# Patient Record
Sex: Female | Born: 1950
Health system: Southern US, Community
[De-identification: ages and names within clinical notes are randomized; demographics above are authoritative.]

## PROBLEM LIST (undated history)

## (undated) DIAGNOSIS — IMO0002 Reserved for concepts with insufficient information to code with codable children: Secondary | ICD-10-CM

## (undated) DIAGNOSIS — E559 Vitamin D deficiency, unspecified: Secondary | ICD-10-CM

## (undated) DIAGNOSIS — T148XXA Other injury of unspecified body region, initial encounter: Secondary | ICD-10-CM

## (undated) DIAGNOSIS — B019 Varicella without complication: Secondary | ICD-10-CM

## (undated) DIAGNOSIS — Z789 Other specified health status: Secondary | ICD-10-CM

## (undated) DIAGNOSIS — Z87440 Personal history of urinary (tract) infections: Secondary | ICD-10-CM

## (undated) DIAGNOSIS — M1990 Unspecified osteoarthritis, unspecified site: Secondary | ICD-10-CM

## (undated) DIAGNOSIS — U071 COVID-19: Secondary | ICD-10-CM

## (undated) DIAGNOSIS — H269 Unspecified cataract: Secondary | ICD-10-CM

## (undated) DIAGNOSIS — E785 Hyperlipidemia, unspecified: Secondary | ICD-10-CM

## (undated) DIAGNOSIS — K219 Gastro-esophageal reflux disease without esophagitis: Secondary | ICD-10-CM

## (undated) DIAGNOSIS — I82409 Acute embolism and thrombosis of unspecified deep veins of unspecified lower extremity: Secondary | ICD-10-CM

## (undated) HISTORY — PX: WISDOM TOOTH EXTRACTION: SHX21

## (undated) HISTORY — DX: COVID-19: U07.1

## (undated) HISTORY — DX: Gastro-esophageal reflux disease without esophagitis: K21.9

## (undated) HISTORY — PX: TUBAL LIGATION: SHX77

## (undated) HISTORY — DX: Personal history of urinary (tract) infections: Z87.440

## (undated) HISTORY — DX: Acute embolism and thrombosis of unspecified deep veins of unspecified lower extremity: I82.409

## (undated) HISTORY — DX: Other injury of unspecified body region, initial encounter: T14.8XXA

## (undated) HISTORY — DX: Varicella without complication: B01.9

## (undated) HISTORY — DX: Hyperlipidemia, unspecified: E78.5

## (undated) HISTORY — DX: Reserved for concepts with insufficient information to code with codable children: IMO0002

## (undated) HISTORY — DX: Unspecified osteoarthritis, unspecified site: M19.90

## (undated) HISTORY — PX: EYE SURGERY: SHX253

## (undated) HISTORY — PX: JOINT REPLACEMENT: SHX530

## (undated) HISTORY — DX: Unspecified cataract: H26.9

## (undated) HISTORY — DX: Vitamin D deficiency, unspecified: E55.9

---

## 1988-04-05 HISTORY — PX: DILATION AND CURETTAGE OF UTERUS: SHX78

## 1998-12-12 ENCOUNTER — Other Ambulatory Visit: Admission: RE | Admit: 1998-12-12 | Discharge: 1998-12-12 | Payer: Self-pay | Admitting: Obstetrics & Gynecology

## 2000-11-11 ENCOUNTER — Other Ambulatory Visit: Admission: RE | Admit: 2000-11-11 | Discharge: 2000-11-11 | Payer: Self-pay | Admitting: Obstetrics & Gynecology

## 2002-06-05 ENCOUNTER — Other Ambulatory Visit: Admission: RE | Admit: 2002-06-05 | Discharge: 2002-06-05 | Payer: Self-pay | Admitting: Obstetrics & Gynecology

## 2002-06-26 ENCOUNTER — Encounter: Admission: RE | Admit: 2002-06-26 | Discharge: 2002-06-26 | Payer: Self-pay | Admitting: Neurology

## 2002-06-26 ENCOUNTER — Encounter: Payer: Self-pay | Admitting: Neurology

## 2002-07-28 ENCOUNTER — Encounter: Payer: Self-pay | Admitting: Neurology

## 2002-07-28 ENCOUNTER — Ambulatory Visit (HOSPITAL_COMMUNITY): Admission: RE | Admit: 2002-07-28 | Discharge: 2002-07-28 | Payer: Self-pay | Admitting: Neurology

## 2003-12-26 ENCOUNTER — Other Ambulatory Visit: Admission: RE | Admit: 2003-12-26 | Discharge: 2003-12-26 | Payer: Self-pay | Admitting: Obstetrics & Gynecology

## 2005-07-15 ENCOUNTER — Other Ambulatory Visit: Admission: RE | Admit: 2005-07-15 | Discharge: 2005-07-15 | Payer: Self-pay | Admitting: Obstetrics & Gynecology

## 2010-06-07 ENCOUNTER — Inpatient Hospital Stay (INDEPENDENT_AMBULATORY_CARE_PROVIDER_SITE_OTHER)
Admission: RE | Admit: 2010-06-07 | Discharge: 2010-06-07 | Disposition: A | Discharge status: Home / Self Care | Payer: 59 | Source: Ambulatory Visit | Attending: Family Medicine | Admitting: Family Medicine

## 2010-06-07 DIAGNOSIS — R071 Chest pain on breathing: Secondary | ICD-10-CM

## 2010-11-05 ENCOUNTER — Encounter: Payer: Self-pay | Admitting: Gastroenterology

## 2010-11-27 ENCOUNTER — Ambulatory Visit (AMBULATORY_SURGERY_CENTER): Payer: 59 | Admitting: *Deleted

## 2010-11-27 VITALS — Ht 64.0 in | Wt 137.0 lb

## 2010-11-27 DIAGNOSIS — Z1211 Encounter for screening for malignant neoplasm of colon: Secondary | ICD-10-CM

## 2010-11-27 MED ORDER — SUPREP BOWEL PREP KIT 17.5-3.13-1.6 GM/177ML PO SOLN: 1.0000 | ORAL | Status: DC

## 2010-12-10 ENCOUNTER — Other Ambulatory Visit: Payer: 59 | Admitting: Gastroenterology

## 2010-12-23 ENCOUNTER — Other Ambulatory Visit: Payer: Self-pay | Admitting: Internal Medicine

## 2010-12-23 DIAGNOSIS — M545 Low back pain: Secondary | ICD-10-CM

## 2010-12-23 DIAGNOSIS — M549 Dorsalgia, unspecified: Secondary | ICD-10-CM

## 2010-12-25 ENCOUNTER — Other Ambulatory Visit: Payer: 59 | Admitting: Gastroenterology

## 2010-12-29 ENCOUNTER — Ambulatory Visit
Admission: RE | Admit: 2010-12-29 | Discharge: 2010-12-29 | Disposition: A | Discharge status: Home / Self Care | Payer: 59 | Source: Ambulatory Visit | Attending: Internal Medicine | Admitting: Internal Medicine

## 2010-12-29 DIAGNOSIS — M549 Dorsalgia, unspecified: Secondary | ICD-10-CM

## 2010-12-29 DIAGNOSIS — M545 Low back pain: Secondary | ICD-10-CM

## 2011-01-01 ENCOUNTER — Other Ambulatory Visit: Payer: Self-pay | Admitting: Internal Medicine

## 2011-01-01 ENCOUNTER — Other Ambulatory Visit: Payer: 59 | Admitting: Gastroenterology

## 2011-01-01 DIAGNOSIS — M549 Dorsalgia, unspecified: Secondary | ICD-10-CM

## 2011-01-04 ENCOUNTER — Ambulatory Visit
Admission: RE | Admit: 2011-01-04 | Discharge: 2011-01-04 | Disposition: A | Discharge status: Home / Self Care | Payer: 59 | Source: Ambulatory Visit | Attending: Internal Medicine | Admitting: Internal Medicine

## 2011-01-04 ENCOUNTER — Other Ambulatory Visit: Payer: Self-pay | Admitting: Internal Medicine

## 2011-01-04 DIAGNOSIS — M549 Dorsalgia, unspecified: Secondary | ICD-10-CM

## 2011-01-04 DIAGNOSIS — R0989 Other specified symptoms and signs involving the circulatory and respiratory systems: Secondary | ICD-10-CM

## 2011-04-01 ENCOUNTER — Other Ambulatory Visit: Payer: Self-pay | Admitting: Internal Medicine

## 2011-04-01 DIAGNOSIS — M549 Dorsalgia, unspecified: Secondary | ICD-10-CM

## 2011-04-01 DIAGNOSIS — M545 Low back pain: Secondary | ICD-10-CM

## 2011-04-04 ENCOUNTER — Ambulatory Visit
Admission: RE | Admit: 2011-04-04 | Discharge: 2011-04-04 | Disposition: A | Discharge status: Home / Self Care | Payer: 59 | Source: Ambulatory Visit | Attending: Internal Medicine | Admitting: Internal Medicine

## 2011-04-04 DIAGNOSIS — M549 Dorsalgia, unspecified: Secondary | ICD-10-CM

## 2011-04-04 DIAGNOSIS — M545 Low back pain: Secondary | ICD-10-CM

## 2011-04-07 ENCOUNTER — Other Ambulatory Visit: Payer: Self-pay | Admitting: Internal Medicine

## 2011-04-07 DIAGNOSIS — M549 Dorsalgia, unspecified: Secondary | ICD-10-CM

## 2011-04-08 ENCOUNTER — Other Ambulatory Visit: Payer: Self-pay | Admitting: Radiology

## 2011-04-08 ENCOUNTER — Other Ambulatory Visit: Payer: Self-pay | Admitting: Internal Medicine

## 2011-04-08 ENCOUNTER — Ambulatory Visit
Admission: RE | Admit: 2011-04-08 | Discharge: 2011-04-08 | Disposition: A | Discharge status: Home / Self Care | Payer: 59 | Source: Ambulatory Visit | Attending: Internal Medicine | Admitting: Internal Medicine

## 2011-04-08 DIAGNOSIS — M549 Dorsalgia, unspecified: Secondary | ICD-10-CM

## 2011-04-08 HISTORY — DX: Other specified health status: Z78.9

## 2011-04-08 MED ORDER — MIDAZOLAM HCL 2 MG/2ML IJ SOLN
1.0000 mg | INTRAMUSCULAR | Status: DC | PRN
  Administered 2011-04-08 (×2): 1 mg via INTRAVENOUS

## 2011-04-08 MED ORDER — KETOROLAC TROMETHAMINE 30 MG/ML IJ SOLN
30.0000 mg | Freq: Once | INTRAMUSCULAR | Status: AC
  Administered 2011-04-08: 30 mg via INTRAVENOUS

## 2011-04-08 MED ORDER — FENTANYL CITRATE 0.05 MG/ML IJ SOLN
25.0000 ug | INTRAMUSCULAR | Status: DC | PRN
  Administered 2011-04-08 (×2): 25 ug via INTRAVENOUS
  Administered 2011-04-08: 50 ug via INTRAVENOUS

## 2011-04-08 NOTE — Progress Notes (Signed)
Patient's friend, Eunice Blase, at bedside.  Patient denies pain/discomfort at present.  jkl

## 2011-04-08 NOTE — Patient Instructions (Signed)
[facility] Procedure Discharge Instructions  1. May resume a regular diet and any medications that you routinely take (including pain medications). 2. No driving day of procedure. 3. Upon discharge go home and rest for at least 4 hours.  May use an ice pack as needed to injection sites on back.    Please contact our office at [phone] for the following symptoms:   Fever greater than 100 degrees  Increased swelling, pain, or redness at injection site.   Thank you for visiting [facility].  May remove bandaides later today.

## 2011-04-08 NOTE — Progress Notes (Signed)
Pt has no significant medical history. Discharge instructions explained. Pt in very good spirits.

## 2011-04-11 LAB — BODY FLUID CULTURE

## 2011-04-13 LAB — ANAEROBIC CULTURE

## 2011-04-19 ENCOUNTER — Other Ambulatory Visit (HOSPITAL_COMMUNITY): Payer: Self-pay | Admitting: Internal Medicine

## 2011-04-19 DIAGNOSIS — M545 Low back pain: Secondary | ICD-10-CM

## 2011-04-20 ENCOUNTER — Other Ambulatory Visit: Payer: Self-pay | Admitting: Radiology

## 2011-04-23 ENCOUNTER — Encounter (HOSPITAL_COMMUNITY): Payer: Self-pay

## 2011-04-23 ENCOUNTER — Other Ambulatory Visit (HOSPITAL_COMMUNITY): Payer: Self-pay | Admitting: Internal Medicine

## 2011-04-23 ENCOUNTER — Ambulatory Visit (HOSPITAL_COMMUNITY)
Admission: RE | Admit: 2011-04-23 | Discharge: 2011-04-23 | Disposition: A | Discharge status: Home / Self Care | Payer: 59 | Source: Ambulatory Visit | Attending: Internal Medicine | Admitting: Internal Medicine

## 2011-04-23 DIAGNOSIS — IMO0002 Reserved for concepts with insufficient information to code with codable children: Secondary | ICD-10-CM | POA: Insufficient documentation

## 2011-04-23 DIAGNOSIS — M545 Low back pain: Secondary | ICD-10-CM

## 2011-04-23 DIAGNOSIS — R9409 Abnormal results of other function studies of central nervous system: Secondary | ICD-10-CM | POA: Insufficient documentation

## 2011-04-23 DIAGNOSIS — M546 Pain in thoracic spine: Secondary | ICD-10-CM | POA: Insufficient documentation

## 2011-04-23 LAB — CBC
Hemoglobin: 13.6 g/dL (ref 12.0–15.0)
MCH: 31.3 pg (ref 26.0–34.0)
MCHC: 34.1 g/dL (ref 30.0–36.0)
MCV: 91.9 fL (ref 78.0–100.0)
Platelets: 308 10*3/uL (ref 150–400)
RBC: 4.34 MIL/uL (ref 3.87–5.11)

## 2011-04-23 LAB — PROTIME-INR: Prothrombin Time: 12.5 seconds (ref 11.6–15.2)

## 2011-04-23 MED ORDER — MIDAZOLAM HCL 5 MG/5ML IJ SOLN
INTRAMUSCULAR | Status: AC | PRN
  Administered 2011-04-23 (×3): 1 mg via INTRAVENOUS

## 2011-04-23 MED ORDER — SODIUM CHLORIDE 0.9 % IV SOLN
Freq: Once | INTRAVENOUS | Status: AC
  Administered 2011-04-23: 10:00:00 via INTRAVENOUS

## 2011-04-23 MED ORDER — HYDROMORPHONE HCL PF 1 MG/ML IJ SOLN
INTRAMUSCULAR | Status: AC
  Filled 2011-04-23: qty 2

## 2011-04-23 MED ORDER — CEFAZOLIN SODIUM 1-5 GM-% IV SOLN: 1.0000 g | Freq: Once | INTRAVENOUS | Status: DC

## 2011-04-23 MED ORDER — SODIUM CHLORIDE 0.9 % IV SOLN: INTRAVENOUS | Status: AC

## 2011-04-23 MED ORDER — CEFAZOLIN SODIUM 1-5 GM-% IV SOLN
INTRAVENOUS | Status: AC
  Administered 2011-04-23: 12:00:00
  Filled 2011-04-23: qty 50

## 2011-04-23 MED ORDER — MIDAZOLAM HCL 2 MG/2ML IJ SOLN
INTRAMUSCULAR | Status: AC
  Filled 2011-04-23: qty 4

## 2011-04-23 MED ORDER — FENTANYL CITRATE 0.05 MG/ML IJ SOLN
INTRAMUSCULAR | Status: AC | PRN
  Administered 2011-04-23 (×3): 25 ug via INTRAVENOUS

## 2011-04-23 MED ORDER — CEFAZOLIN SODIUM 1-5 GM-% IV SOLN: 1.0000 g | Freq: Three times a day (TID) | INTRAVENOUS | Status: DC

## 2011-04-23 MED ORDER — FENTANYL CITRATE 0.05 MG/ML IJ SOLN
INTRAMUSCULAR | Status: AC
  Filled 2011-04-23: qty 4

## 2011-04-23 NOTE — ED Notes (Signed)
Fluid bolus continues, MD aware of BP

## 2011-04-23 NOTE — Progress Notes (Signed)
Client states Dr said she could go home at 1400 if husband Dr Jorja Loa here and Marlena Clipper notified and per Marlena Clipper ok to d/c home at 1400

## 2011-04-23 NOTE — Progress Notes (Signed)
DC now

## 2011-04-23 NOTE — H&P (Signed)
Leah Bird is an 61 y.o. female.   Chief Complaint: Back pain; abnormal MRI: shows lesions at T7; T8; T9 Recent biopsy at T9 insufficient  HPI: pt now scheduled for re biopsy at one or more levels  Past Medical History  Diagnosis Date  . Arthritis   . Degenerative disk disease     whole spine  . No pertinent past medical history     Past Surgical History  Procedure Date  . Dilation and curettage of uterus 1990    Reacted to anesthesia/was on life support  . Wisdom tooth extraction     under sedation    No family history on file. Social History:  reports that she has never smoked. She has never used smokeless tobacco. She reports that she drinks about 2.4 ounces of alcohol per week. She reports that she does not use illicit drugs.  Allergies:  Allergies  Allergen Reactions  . Other     Pseudocholinesterace- prolonged anesthesia. Was on life support after having    No current outpatient prescriptions on file as of 04/23/2011.   Medications Prior to Admission  Medication Dose Route Frequency Provider Last Rate Last Dose  . 0.9 %  sodium chloride infusion   Intravenous Once Robet Leu, PA 20 mL/hr at 04/23/11 7829      Results for orders placed during the hospital encounter of 04/23/11 (from the past 48 hour(s))  CBC     Status: Normal   Collection Time   04/23/11  9:51 AM      Component Value Range Comment   WBC 7.0  4.0 - 10.5 (K/uL)    RBC 4.34  3.87 - 5.11 (MIL/uL)    Hemoglobin 13.6  12.0 - 15.0 (g/dL)    HCT 56.2  13.0 - 86.5 (%)    MCV 91.9  78.0 - 100.0 (fL)    MCH 31.3  26.0 - 34.0 (pg)    MCHC 34.1  30.0 - 36.0 (g/dL)    RDW 78.4  69.6 - 29.5 (%)    Platelets 308  150 - 400 (K/uL)    No results found.  Review of Systems  Constitutional: Negative for fever.  Respiratory: Positive for cough.   Cardiovascular: Negative for chest pain.  Gastrointestinal: Negative for nausea, vomiting and abdominal pain.  Neurological: Negative for headaches.     There were no vitals taken for this visit. Physical Exam  Constitutional: She is oriented to person, place, and time. She appears well-developed and well-nourished.  HENT:  Head: Normocephalic.  Eyes: EOM are normal.  Neck: Normal range of motion.  Cardiovascular: Normal rate, regular rhythm and normal heart sounds.   No murmur heard. Respiratory: Effort normal and breath sounds normal. She has no wheezes.  GI: Soft. Bowel sounds are normal. There is no tenderness.  Musculoskeletal: Normal range of motion.  Neurological: She is alert and oriented to person, place, and time.  Skin: Skin is warm and dry.     Assessment/Plan Back pain with abnormal findings on MRI at T7; T8; T9 Had biopsy at T9 last week but was insufficient Now scheduled for re biopsy at one or more levels. Pt aware of procedure benefits and risks and agreeable to proceed. Consent signed.  Bernetta Sutley A 04/23/2011, 10:26 AM

## 2011-04-23 NOTE — Procedures (Signed)
S/P T8 and T9 core biopsies. No acute complications.

## 2011-04-23 NOTE — ED Notes (Signed)
Fluid bolus given for bp 89/62

## 2011-05-04 ENCOUNTER — Ambulatory Visit
Admission: RE | Admit: 2011-05-04 | Discharge: 2011-05-04 | Disposition: A | Discharge status: Home / Self Care | Payer: 59 | Source: Ambulatory Visit | Attending: Internal Medicine | Admitting: Internal Medicine

## 2011-05-04 ENCOUNTER — Other Ambulatory Visit: Payer: Self-pay | Admitting: Internal Medicine

## 2011-05-04 DIAGNOSIS — R05 Cough: Secondary | ICD-10-CM

## 2011-05-04 DIAGNOSIS — M546 Pain in thoracic spine: Secondary | ICD-10-CM

## 2011-05-04 LAB — FUNGUS CULTURE W SMEAR: Smear Result: NONE SEEN

## 2011-05-19 DIAGNOSIS — M546 Pain in thoracic spine: Secondary | ICD-10-CM | POA: Insufficient documentation

## 2011-05-19 DIAGNOSIS — M545 Low back pain, unspecified: Secondary | ICD-10-CM | POA: Insufficient documentation

## 2011-05-20 DIAGNOSIS — E8809 Other disorders of plasma-protein metabolism, not elsewhere classified: Secondary | ICD-10-CM | POA: Insufficient documentation

## 2012-01-18 ENCOUNTER — Other Ambulatory Visit: Payer: Self-pay | Admitting: Internal Medicine

## 2012-01-18 DIAGNOSIS — M545 Low back pain: Secondary | ICD-10-CM

## 2012-01-24 ENCOUNTER — Other Ambulatory Visit: Payer: Self-pay | Admitting: Internal Medicine

## 2012-01-24 ENCOUNTER — Ambulatory Visit
Admission: RE | Admit: 2012-01-24 | Discharge: 2012-01-24 | Disposition: A | Discharge status: Home / Self Care | Payer: 59 | Source: Ambulatory Visit | Attending: Internal Medicine | Admitting: Internal Medicine

## 2012-01-24 DIAGNOSIS — M545 Low back pain: Secondary | ICD-10-CM

## 2012-01-24 DIAGNOSIS — M549 Dorsalgia, unspecified: Secondary | ICD-10-CM

## 2012-01-29 ENCOUNTER — Ambulatory Visit
Admission: RE | Admit: 2012-01-29 | Discharge: 2012-01-29 | Disposition: A | Discharge status: Home / Self Care | Payer: 59 | Source: Ambulatory Visit | Attending: Internal Medicine | Admitting: Internal Medicine

## 2012-01-29 ENCOUNTER — Other Ambulatory Visit: Payer: 59

## 2012-01-29 DIAGNOSIS — M549 Dorsalgia, unspecified: Secondary | ICD-10-CM

## 2012-08-07 ENCOUNTER — Encounter: Payer: Self-pay | Admitting: Gastroenterology

## 2012-09-14 ENCOUNTER — Ambulatory Visit (AMBULATORY_SURGERY_CENTER): Payer: 59 | Admitting: *Deleted

## 2012-09-14 VITALS — Ht 63.5 in | Wt 142.2 lb

## 2012-09-14 DIAGNOSIS — Z1211 Encounter for screening for malignant neoplasm of colon: Secondary | ICD-10-CM

## 2012-09-14 MED ORDER — NA SULFATE-K SULFATE-MG SULF 17.5-3.13-1.6 GM/177ML PO SOLN: 1.0000 | Freq: Once | ORAL | Status: DC

## 2012-09-14 NOTE — Progress Notes (Signed)
No egg or soy allergy. ewm No previous colonoscopy. ewm No home 02 use. ewm Did have to be on life support from past sedation problem. Reaction to pseudocholinesterace of the sedation. ewm Pt states she has a prep at home from previous order of colonoscopy- was same prep as husband had and pt wanted me to check prep from husbands chart.  Per husbands chart pt has suprep at home which is the prep [facility] uses and sent new script just in case.  Pt to call with questions if has any about the prep or if it is different by chance. ewm

## 2012-09-22 ENCOUNTER — Encounter: Payer: Self-pay | Admitting: Gastroenterology

## 2012-09-22 ENCOUNTER — Ambulatory Visit (AMBULATORY_SURGERY_CENTER): Payer: 59 | Admitting: Gastroenterology

## 2012-09-22 VITALS — BP 112/77 | HR 62 | Temp 98.5°F | Resp 16 | Ht 63.5 in | Wt 142.0 lb

## 2012-09-22 DIAGNOSIS — K5730 Diverticulosis of large intestine without perforation or abscess without bleeding: Secondary | ICD-10-CM

## 2012-09-22 DIAGNOSIS — Z1211 Encounter for screening for malignant neoplasm of colon: Secondary | ICD-10-CM

## 2012-09-22 MED ORDER — SODIUM CHLORIDE 0.9 % IV SOLN: 500.0000 mL | INTRAVENOUS | Status: DC

## 2012-09-22 NOTE — Progress Notes (Signed)
Report to pacu rn, vss, bbs=clear 

## 2012-09-22 NOTE — Patient Instructions (Addendum)
YOU HAD AN ENDOSCOPIC PROCEDURE TODAY AT THE [facility]: Refer to the procedure report that was given to you for any specific questions about what was found during the examination.  If the procedure report does not answer your questions, please call your gastroenterologist to clarify.  If you requested that your care partner not be given the details of your procedure findings, then the procedure report has been included in a sealed envelope for you to review at your convenience later.  YOU SHOULD EXPECT: Some feelings of bloating in the abdomen. Passage of more gas than usual.  Walking can help get rid of the air that was put into your GI tract during the procedure and reduce the bloating. If you had a lower endoscopy (such as a colonoscopy or flexible sigmoidoscopy) you may notice spotting of blood in your stool or on the toilet paper. If you underwent a bowel prep for your procedure, then you may not have a normal bowel movement for a few days.  DIET: Your first meal following the procedure should be a light meal and then it is ok to progress to your normal diet.  A half-sandwich or bowl of soup is an example of a good first meal.  Heavy or fried foods are harder to digest and may make you feel nauseous or bloated.  Likewise meals heavy in dairy and vegetables can cause extra gas to form and this can also increase the bloating.  Drink plenty of fluids but you should avoid alcoholic beverages for 24 hours.  ACTIVITY: Your care partner should take you home directly after the procedure.  You should plan to take it easy, moving slowly for the rest of the day.  You can resume normal activity the day after the procedure however you should NOT DRIVE or use heavy machinery for 24 hours (because of the sedation medicines used during the test).    SYMPTOMS TO REPORT IMMEDIATELY: A gastroenterologist can be reached at any hour.  During normal business hours, 8:30 AM to 5:00 PM Monday through Friday,  call [phone].  After hours and on weekends, please call the GI answering service at [phone] who will take a message and have the physician on call contact you.   Following lower endoscopy (colonoscopy or flexible sigmoidoscopy):  Excessive amounts of blood in the stool  Significant tenderness or worsening of abdominal pains  Swelling of the abdomen that is new, acute  Fever of 100F or higher   FOLLOW UP: If any biopsies were taken you will be contacted by phone or by letter within the next 1-3 weeks.  Call your gastroenterologist if you have not heard about the biopsies in 3 weeks.  Our staff will call the home number listed on your records the next business day following your procedure to check on you and address any questions or concerns that you may have at that time regarding the information given to you following your procedure. This is a courtesy call and so if there is no answer at the home number and we have not heard from you through the emergency physician on call, we will assume that you have returned to your regular daily activities without incident.  SIGNATURES/CONFIDENTIALITY: You and/or your care partner have signed paperwork which will be entered into your electronic medical record.  These signatures attest to the fact that that the information above on your After Visit Summary has been reviewed and is understood.  Full responsibility of the confidentiality of   this discharge information lies with you and/or your care-partner.  Diverticulosis, high fiber diet-handouts given  Repeat colonoscopy in 10 years.   

## 2012-09-22 NOTE — Progress Notes (Signed)
Patient states that she is hypoglycemic at times.   She states now that she thinks that she is ok now.  States that she hasn't had an episode in years.  I asked her if she would like for me to check her CBG, and she said that she is ok.

## 2012-09-22 NOTE — Op Note (Signed)
[facility] [address] [place], [address]   COLONOSCOPY PROCEDURE REPORT  PATIENT: Leah Bird, Leah Bird  MR#: [id number] BIRTHDATE: 1950-05-17 , 62  yrs. old GENDER: Female ENDOSCOPIST: Louis Meckel, MD REFERRED NF:AOZHY Chilton Si, M.D. PROCEDURE DATE:  09/22/2012 PROCEDURE:   Colonoscopy, diagnostic ASA CLASS:   Class I INDICATIONS:average risk screening. MEDICATIONS: MAC sedation, administered by CRNA and Propofol (Diprivan) 280 mg IV  DESCRIPTION OF PROCEDURE:   After the risks benefits and alternatives of the procedure were thoroughly explained, informed consent was obtained.  A digital rectal exam revealed no abnormalities of the rectum.   The LB [id number]-[id number]  endoscope was introduced through the anus and advanced to the terminal ileum which was intubated for a short distance. No adverse events experienced.   The quality of the prep was excellent using Suprep The instrument was then slowly withdrawn as the colon was fully examined.      COLON FINDINGS: The mucosa appeared normal in the terminal ileum. Mild diverticulosis was noted in the sigmoid colon.   The colon mucosa was otherwise normal.  Retroflexed views revealed no abnormalities. The time to cecum=3 minutes 51 seconds.  Withdrawal time=6 minutes 51 seconds.  The scope was withdrawn and the procedure completed. COMPLICATIONS: There were no complications.  ENDOSCOPIC IMPRESSION: 1.   Normal mucosa in the terminal ileum 2.   Mild diverticulosis was noted in the sigmoid colon 3.   The colon mucosa was otherwise normal  RECOMMENDATIONS: Continue current colorectal screening recommendations for "routine risk" patients with a repeat colonoscopy in 10 years.   eSigned:  Louis Meckel, MD 09/22/2012 11:53 AM   cc:

## 2012-09-22 NOTE — Progress Notes (Signed)
Patient did not have preoperative order for IV antibiotic SSI prophylaxis. (G8918)  Patient did not experience any of the following events: a burn prior to discharge; a fall within the facility; wrong site/side/patient/procedure/implant event; or a hospital transfer or hospital admission upon discharge from the facility. (G8907)  

## 2012-09-25 ENCOUNTER — Telehealth: Payer: Self-pay | Admitting: *Deleted

## 2012-09-25 NOTE — Telephone Encounter (Signed)
  Follow up Call-  Call back number 09/22/2012  Post procedure Call Back phone  # [phone]  Permission to leave phone message Yes     Patient questions:  Do you have a fever, pain , or abdominal swelling? no Pain Score  0 *  Have you tolerated food without any problems? yes  Have you been able to return to your normal activities? yes  Do you have any questions about your discharge instructions: Diet   no Medications  no Follow up visit  no  Do you have questions or concerns about your Care? no  Actions: * If pain score is 4 or above: No action needed, pain <4.

## 2014-12-12 ENCOUNTER — Other Ambulatory Visit: Payer: Self-pay | Admitting: Obstetrics & Gynecology

## 2014-12-13 LAB — CYTOLOGY - PAP

## 2015-10-10 MED FILL — predniSONE 20 MG TABS: 20 | 15 days supply | Qty: 30 | Fill #0

## 2015-10-14 MED FILL — NITROFURANTOIN MONO-MCR 100: 100 | 7 days supply | Qty: 14 | Fill #0

## 2016-01-21 DIAGNOSIS — N390 Urinary tract infection, site not specified: Secondary | ICD-10-CM | POA: Diagnosis not present

## 2016-01-21 DIAGNOSIS — Z01419 Encounter for gynecological examination (general) (routine) without abnormal findings: Secondary | ICD-10-CM | POA: Diagnosis not present

## 2016-01-21 DIAGNOSIS — Z6823 Body mass index (BMI) 23.0-23.9, adult: Secondary | ICD-10-CM | POA: Diagnosis not present

## 2016-01-21 DIAGNOSIS — Z1231 Encounter for screening mammogram for malignant neoplasm of breast: Secondary | ICD-10-CM | POA: Diagnosis not present

## 2016-01-21 MED FILL — NITROFURANTOIN MONO-MCR 100: 100 | 5 days supply | Qty: 10 | Fill #0

## 2016-05-05 MED FILL — CIPROFLOXACIN HCL 500 MG TA: 500 | 28 days supply | Qty: 56 | Fill #0

## 2016-05-05 MED FILL — predniSONE 10 MG TABS: 10 | 7 days supply | Qty: 15 | Fill #0

## 2016-05-05 MED FILL — AZITHROMYCIN 500 MG TABLET: 500 | 3 days supply | Qty: 3 | Fill #0

## 2016-05-10 DIAGNOSIS — H1013 Acute atopic conjunctivitis, bilateral: Secondary | ICD-10-CM | POA: Diagnosis not present

## 2016-05-13 MED FILL — predniSONE 10 MG TABS: 10 | 13 days supply | Qty: 27 | Fill #0

## 2016-07-12 MED FILL — NITROFURANTOIN MONO-MCR 100: 100 | 7 days supply | Qty: 14 | Fill #0

## 2016-10-26 ENCOUNTER — Other Ambulatory Visit: Payer: Self-pay | Admitting: Internal Medicine

## 2016-10-26 ENCOUNTER — Ambulatory Visit
Admission: RE | Admit: 2016-10-26 | Discharge: 2016-10-26 | Disposition: A | Discharge status: Home / Self Care | Payer: PPO | Source: Ambulatory Visit | Attending: Internal Medicine | Admitting: Internal Medicine

## 2016-10-26 DIAGNOSIS — R05 Cough: Secondary | ICD-10-CM | POA: Diagnosis not present

## 2016-10-26 DIAGNOSIS — R059 Cough, unspecified: Secondary | ICD-10-CM

## 2016-10-26 DIAGNOSIS — M546 Pain in thoracic spine: Secondary | ICD-10-CM

## 2016-10-26 DIAGNOSIS — J410 Simple chronic bronchitis: Secondary | ICD-10-CM | POA: Diagnosis not present

## 2016-10-26 DIAGNOSIS — M545 Low back pain: Secondary | ICD-10-CM | POA: Diagnosis not present

## 2016-10-26 DIAGNOSIS — M5134 Other intervertebral disc degeneration, thoracic region: Secondary | ICD-10-CM | POA: Diagnosis not present

## 2016-10-26 MED FILL — HYDROCODON-APAP 5-325: 5-325 | 10 days supply | Qty: 20 | Fill #0

## 2016-11-29 DIAGNOSIS — M65311 Trigger thumb, right thumb: Secondary | ICD-10-CM | POA: Diagnosis not present

## 2016-11-29 DIAGNOSIS — M79645 Pain in left finger(s): Secondary | ICD-10-CM | POA: Diagnosis not present

## 2016-11-29 DIAGNOSIS — M180 Bilateral primary osteoarthritis of first carpometacarpal joints: Secondary | ICD-10-CM | POA: Diagnosis not present

## 2016-11-29 DIAGNOSIS — M79644 Pain in right finger(s): Secondary | ICD-10-CM | POA: Diagnosis not present

## 2017-02-09 DIAGNOSIS — M65311 Trigger thumb, right thumb: Secondary | ICD-10-CM | POA: Diagnosis not present

## 2017-03-24 DIAGNOSIS — M180 Bilateral primary osteoarthritis of first carpometacarpal joints: Secondary | ICD-10-CM | POA: Diagnosis not present

## 2017-03-24 DIAGNOSIS — M65311 Trigger thumb, right thumb: Secondary | ICD-10-CM | POA: Diagnosis not present

## 2017-04-22 DIAGNOSIS — M65311 Trigger thumb, right thumb: Secondary | ICD-10-CM | POA: Insufficient documentation

## 2017-04-22 DIAGNOSIS — M79644 Pain in right finger(s): Secondary | ICD-10-CM | POA: Insufficient documentation

## 2017-04-22 DIAGNOSIS — M6530 Trigger finger, unspecified finger: Secondary | ICD-10-CM | POA: Insufficient documentation

## 2017-04-25 MED FILL — SULFAMETHOXAZOLE/TMP DS TAB: 800-160 | 9 days supply | Qty: 18 | Fill #0

## 2017-05-18 DIAGNOSIS — Z6823 Body mass index (BMI) 23.0-23.9, adult: Secondary | ICD-10-CM | POA: Diagnosis not present

## 2017-05-18 DIAGNOSIS — Z01419 Encounter for gynecological examination (general) (routine) without abnormal findings: Secondary | ICD-10-CM | POA: Diagnosis not present

## 2017-05-18 DIAGNOSIS — Z1231 Encounter for screening mammogram for malignant neoplasm of breast: Secondary | ICD-10-CM | POA: Diagnosis not present

## 2017-06-09 DIAGNOSIS — M65311 Trigger thumb, right thumb: Secondary | ICD-10-CM | POA: Diagnosis not present

## 2017-06-09 MED FILL — HYDROCODON-APAP 5-325: 5-325 | 5 days supply | Qty: 40 | Fill #0

## 2017-06-09 MED FILL — SULFAMETHOXAZOLE-TMP DS TAB: 800-160 | 5 days supply | Qty: 10 | Fill #0

## 2017-06-21 DIAGNOSIS — M79644 Pain in right finger(s): Secondary | ICD-10-CM | POA: Diagnosis not present

## 2017-06-21 DIAGNOSIS — M65311 Trigger thumb, right thumb: Secondary | ICD-10-CM | POA: Diagnosis not present

## 2017-06-27 ENCOUNTER — Other Ambulatory Visit: Payer: Self-pay | Admitting: Internal Medicine

## 2017-06-27 ENCOUNTER — Ambulatory Visit
Admission: RE | Admit: 2017-06-27 | Discharge: 2017-06-27 | Disposition: A | Discharge status: Home / Self Care | Payer: PPO | Source: Ambulatory Visit | Attending: Internal Medicine | Admitting: Internal Medicine

## 2017-06-27 DIAGNOSIS — J181 Lobar pneumonia, unspecified organism: Principal | ICD-10-CM

## 2017-06-27 DIAGNOSIS — R05 Cough: Secondary | ICD-10-CM | POA: Diagnosis not present

## 2017-06-27 DIAGNOSIS — J189 Pneumonia, unspecified organism: Secondary | ICD-10-CM | POA: Diagnosis not present

## 2017-06-27 MED FILL — AZITHROMYCIN 250 MG TABLET: 250 | 5 days supply | Qty: 6 | Fill #0

## 2017-07-07 DIAGNOSIS — M79644 Pain in right finger(s): Secondary | ICD-10-CM | POA: Diagnosis not present

## 2017-07-21 DIAGNOSIS — J189 Pneumonia, unspecified organism: Secondary | ICD-10-CM | POA: Diagnosis not present

## 2017-07-21 MED FILL — AMOXICILLIN 500 MG CAPSULE: 500 | 9 days supply | Qty: 36 | Fill #0

## 2017-08-31 ENCOUNTER — Other Ambulatory Visit: Payer: Self-pay | Admitting: Internal Medicine

## 2017-08-31 ENCOUNTER — Ambulatory Visit
Admission: RE | Admit: 2017-08-31 | Discharge: 2017-08-31 | Disposition: A | Discharge status: Home / Self Care | Payer: PPO | Source: Ambulatory Visit | Attending: Internal Medicine | Admitting: Internal Medicine

## 2017-08-31 DIAGNOSIS — J189 Pneumonia, unspecified organism: Secondary | ICD-10-CM | POA: Diagnosis not present

## 2017-08-31 DIAGNOSIS — Z8701 Personal history of pneumonia (recurrent): Secondary | ICD-10-CM

## 2017-11-22 MED FILL — predniSONE 10 MG TABS: 10 | 20 days supply | Qty: 30 | Fill #0

## 2018-03-13 DIAGNOSIS — M542 Cervicalgia: Secondary | ICD-10-CM | POA: Diagnosis not present

## 2018-03-13 DIAGNOSIS — R51 Headache: Secondary | ICD-10-CM | POA: Diagnosis not present

## 2018-03-13 MED FILL — ETODOLAC ER 500 MG TB24: 500 | 30 days supply | Qty: 30 | Fill #0

## 2018-03-27 DIAGNOSIS — R52 Pain, unspecified: Secondary | ICD-10-CM | POA: Diagnosis not present

## 2018-05-09 DIAGNOSIS — H26493 Other secondary cataract, bilateral: Secondary | ICD-10-CM | POA: Diagnosis not present

## 2018-06-20 DIAGNOSIS — Z124 Encounter for screening for malignant neoplasm of cervix: Secondary | ICD-10-CM | POA: Diagnosis not present

## 2018-06-20 DIAGNOSIS — Z1231 Encounter for screening mammogram for malignant neoplasm of breast: Secondary | ICD-10-CM | POA: Diagnosis not present

## 2018-06-20 DIAGNOSIS — Z6825 Body mass index (BMI) 25.0-25.9, adult: Secondary | ICD-10-CM | POA: Diagnosis not present

## 2018-12-21 MED FILL — NITROFURANTOIN MONO-MCR 100: 100 | 7 days supply | Qty: 14 | Fill #0

## 2018-12-29 DIAGNOSIS — J209 Acute bronchitis, unspecified: Secondary | ICD-10-CM | POA: Diagnosis not present

## 2018-12-29 DIAGNOSIS — K219 Gastro-esophageal reflux disease without esophagitis: Secondary | ICD-10-CM | POA: Diagnosis not present

## 2018-12-29 MED FILL — predniSONE 10 MG TABS: 10 | 50 days supply | Qty: 50 | Fill #0

## 2019-01-01 DIAGNOSIS — N390 Urinary tract infection, site not specified: Secondary | ICD-10-CM | POA: Diagnosis not present

## 2019-01-01 DIAGNOSIS — R309 Painful micturition, unspecified: Secondary | ICD-10-CM | POA: Diagnosis not present

## 2019-01-05 MED FILL — AMOX-CLAV 500-125 MG TABLET: 500-125 | 7 days supply | Qty: 14 | Fill #0

## 2019-02-19 ENCOUNTER — Ambulatory Visit
Admission: RE | Admit: 2019-02-19 | Discharge: 2019-02-19 | Disposition: A | Discharge status: Home / Self Care | Payer: PPO | Source: Ambulatory Visit | Attending: Internal Medicine | Admitting: Internal Medicine

## 2019-02-19 ENCOUNTER — Other Ambulatory Visit: Payer: Self-pay | Admitting: Internal Medicine

## 2019-02-19 ENCOUNTER — Other Ambulatory Visit: Payer: Self-pay

## 2019-02-19 DIAGNOSIS — R053 Chronic cough: Secondary | ICD-10-CM

## 2019-02-19 DIAGNOSIS — R0981 Nasal congestion: Secondary | ICD-10-CM | POA: Diagnosis not present

## 2019-02-19 DIAGNOSIS — R0989 Other specified symptoms and signs involving the circulatory and respiratory systems: Secondary | ICD-10-CM | POA: Diagnosis not present

## 2019-02-19 DIAGNOSIS — R05 Cough: Secondary | ICD-10-CM

## 2019-02-20 MED FILL — SYMBICORT 160-4.5 MCG INH: 160-4.5 | 90 days supply | Qty: 10 | Fill #0

## 2019-03-23 ENCOUNTER — Telehealth: Payer: Self-pay | Admitting: Internal Medicine

## 2019-03-23 DIAGNOSIS — M436 Torticollis: Secondary | ICD-10-CM | POA: Insufficient documentation

## 2019-03-23 NOTE — Telephone Encounter (Signed)
Patient called asking if you would be willing to see her and her husband to establish care?   I have sent a message regarding the husband as well.

## 2019-03-27 DIAGNOSIS — M542 Cervicalgia: Secondary | ICD-10-CM | POA: Diagnosis not present

## 2019-03-27 MED FILL — HYDROCODON-APAP 5-325: 5-325 | 5 days supply | Qty: 20 | Fill #0

## 2019-03-27 MED FILL — CELECOXIB 200 MG CAP: 200 | 30 days supply | Qty: 30 | Fill #0

## 2019-03-27 NOTE — Telephone Encounter (Signed)
I'll be honored. Yes. Thx 

## 2019-03-27 NOTE — Telephone Encounter (Signed)
Left message with patient's husband informing.

## 2019-04-03 DIAGNOSIS — M542 Cervicalgia: Secondary | ICD-10-CM | POA: Diagnosis not present

## 2019-04-09 MED FILL — METHOCARBAMOL 500 MG TABS: 500 | 10 days supply | Qty: 30 | Fill #0

## 2019-04-12 ENCOUNTER — Encounter: Payer: Self-pay | Admitting: Internal Medicine

## 2019-04-12 DIAGNOSIS — R519 Headache, unspecified: Secondary | ICD-10-CM | POA: Diagnosis not present

## 2019-04-12 DIAGNOSIS — M542 Cervicalgia: Secondary | ICD-10-CM | POA: Diagnosis not present

## 2019-04-19 ENCOUNTER — Ambulatory Visit (INDEPENDENT_AMBULATORY_CARE_PROVIDER_SITE_OTHER): Payer: PPO

## 2019-04-19 ENCOUNTER — Other Ambulatory Visit: Payer: Self-pay

## 2019-04-19 ENCOUNTER — Encounter: Payer: Self-pay | Admitting: Internal Medicine

## 2019-04-19 ENCOUNTER — Ambulatory Visit (INDEPENDENT_AMBULATORY_CARE_PROVIDER_SITE_OTHER): Payer: PPO | Admitting: Internal Medicine

## 2019-04-19 VITALS — BP 120/62 | HR 66 | Temp 98.2°F | Ht 63.5 in | Wt 145.0 lb

## 2019-04-19 DIAGNOSIS — M1991 Primary osteoarthritis, unspecified site: Secondary | ICD-10-CM | POA: Diagnosis not present

## 2019-04-19 DIAGNOSIS — M542 Cervicalgia: Secondary | ICD-10-CM

## 2019-04-19 DIAGNOSIS — K219 Gastro-esophageal reflux disease without esophagitis: Secondary | ICD-10-CM | POA: Diagnosis not present

## 2019-04-19 DIAGNOSIS — E8809 Other disorders of plasma-protein metabolism, not elsewhere classified: Secondary | ICD-10-CM | POA: Diagnosis not present

## 2019-04-19 DIAGNOSIS — R202 Paresthesia of skin: Secondary | ICD-10-CM

## 2019-04-19 DIAGNOSIS — E559 Vitamin D deficiency, unspecified: Secondary | ICD-10-CM | POA: Diagnosis not present

## 2019-04-19 DIAGNOSIS — M5481 Occipital neuralgia: Secondary | ICD-10-CM | POA: Diagnosis not present

## 2019-04-19 DIAGNOSIS — Z780 Asymptomatic menopausal state: Secondary | ICD-10-CM | POA: Diagnosis not present

## 2019-04-19 MED ORDER — VITAMIN D3 50 MCG (2000 UT) PO CAPS: 2000.0000 [IU] | ORAL_CAPSULE | Freq: Every day | ORAL | 3 refills | Status: DC

## 2019-04-19 MED ORDER — METHYLPREDNISOLONE 4 MG PO TBPK: ORAL_TABLET | ORAL | 0 refills | Status: DC

## 2019-04-19 MED FILL — METHYLPREDNISOLONE 4 MG TAB: 4 | 6 days supply | Qty: 21 | Fill #0

## 2019-04-19 NOTE — Progress Notes (Signed)
Subjective:  Patient ID: Leah Bird, female    DOB: [birth year]  Age: 69 y.o. MRN: [id number]  CC: No chief complaint on file.   HPI [name] presents for a new pain in the L temple - worse in AM and PM. Pain is 6-7/10 H/o Modic 1 back arthritis.  Complains of longstanding low back pain, cervical pain Celebrex helped arthritis pains in the past.  Patient decided to stop taking Celebrex after 15 or 20 years of its use. Complains of GERD-he was told that GERD was causing her dry cough.  Using a PPI helped Complains of possible Asthma -using symbicort Norco -very rare use.  3 children  Outpatient Medications Prior to Visit  Medication Sig Dispense Refill  . celecoxib (CELEBREX) 200 MG capsule     . Cholecalciferol (VITAMIN D) 50 MCG (2000 UT) tablet     . HYDROcodone-acetaminophen (NORCO) 5-325 MG per tablet Take 0.5 tablets by mouth daily as needed. For pain    . methocarbamol (ROBAXIN) 500 MG tablet     . Naproxen Sodium (ALEVE PO) Take 2 tablets by mouth as needed.    Marland Kitchen omeprazole (PRILOSEC) 20 MG capsule     . SYMBICORT 160-4.5 MCG/ACT inhaler      No facility-administered medications prior to visit.    ROS: Review of Systems  Constitutional: Negative for activity change, appetite change, chills, fatigue and unexpected weight change.  HENT: Negative for congestion, mouth sores and sinus pressure.   Eyes: Negative for visual disturbance.  Respiratory: Negative for cough and chest tightness.   Gastrointestinal: Negative for abdominal pain and nausea.  Genitourinary: Negative for difficulty urinating, frequency and vaginal pain.  Musculoskeletal: Positive for arthralgias, back pain, neck pain and neck stiffness. Negative for gait problem.  Skin: Negative for pallor and rash.  Neurological: Negative for dizziness, tremors, weakness, numbness and headaches.  Psychiatric/Behavioral: Negative for confusion and sleep disturbance.    Objective:  BP 120/62 (BP Location:  Left Arm, Patient Position: Sitting, Cuff Size: Normal)   Pulse 66   Temp 98.2 F (36.8 C) (Oral)   Ht 5' 3.5" (1.613 m)   Wt 145 lb (65.8 kg)   SpO2 97%   BMI 25.28 kg/m   BP Readings from Last 3 Encounters:  04/19/19 120/62  09/22/12 112/77  04/23/11 126/84    Wt Readings from Last 3 Encounters:  04/19/19 145 lb (65.8 kg)  09/22/12 142 lb (64.4 kg)  09/14/12 142 lb 3.2 oz (64.5 kg)    Physical Exam Constitutional:      General: She is not in acute distress.    Appearance: She is well-developed.  HENT:     Head: Normocephalic.     Right Ear: External ear normal.     Left Ear: External ear normal.     Nose: Nose normal.  Eyes:     General:        Right eye: No discharge.        Left eye: No discharge.     Conjunctiva/sclera: Conjunctivae normal.     Pupils: Pupils are equal, round, and reactive to light.  Neck:     Thyroid: No thyromegaly.     Vascular: No JVD.     Trachea: No tracheal deviation.  Cardiovascular:     Rate and Rhythm: Normal rate and regular rhythm.     Heart sounds: Normal heart sounds.  Pulmonary:     Effort: No respiratory distress.     Breath sounds: No stridor.  No wheezing.  Abdominal:     General: Bowel sounds are normal. There is no distension.     Palpations: Abdomen is soft. There is no mass.     Tenderness: There is no abdominal tenderness. There is no guarding or rebound.  Musculoskeletal:        General: Tenderness present.     Cervical back: Normal range of motion and neck supple.  Lymphadenopathy:     Cervical: No cervical adenopathy.  Skin:    Findings: No erythema or rash.  Neurological:     Cranial Nerves: No cranial nerve deficit.     Motor: No abnormal muscle tone.     Coordination: Coordination normal.     Deep Tendon Reflexes: Reflexes normal.  Psychiatric:        Behavior: Behavior normal.        Thought Content: Thought content normal.        Judgment: Judgment normal.    L occip nerve - tender Neck is stiff,  tender, with decreased range of motion Lab Results  Component Value Date   WBC 7.0 04/23/2011   HGB 13.6 04/23/2011   HCT 39.9 04/23/2011   PLT 308 04/23/2011   INR 0.91 04/23/2011    DG Sinuses Complete  Result Date: 02/19/2019 CLINICAL DATA:  Congestion. EXAM: PARANASAL SINUSES - COMPLETE 3 + VIEW COMPARISON:  None. FINDINGS: The paranasal sinus are aerated. There is no evidence of sinus opacification air-fluid levels or mucosal thickening. No significant bone abnormalities are seen. IMPRESSION: Negative. Electronically Signed   By: Constance Holster M.D.   On: 02/19/2019 20:05   DG Chest 2 View  Result Date: 02/19/2019 CLINICAL DATA:  Chronic cough. EXAM: CHEST - 2 VIEW COMPARISON:  Aug 31, 2017 FINDINGS: There is sclerosis of the medial right clavicle which is relatively stable when compared to Aug 31, 2017 but has substantially progressed since 2018. There is pleuroparenchymal scarring at the lung apices. The heart size is stable from prior study. There is no pneumothorax. No focal infiltrate. No large pleural effusion. IMPRESSION: 1. No definite acute cardiopulmonary process. 2. Increasing sclerosis in the medial right clavicle of unknown clinical significance. This is relatively stable since 2019 but has significantly progressed since 2018. Consider further evaluation with CT as clinically indicated. Electronically Signed   By: Constance Holster M.D.   On: 02/19/2019 20:04    Assessment & Plan:     Follow-up: No follow-ups on file.  Walker Kehr, MD

## 2019-04-19 NOTE — Assessment & Plan Note (Addendum)
  X ray  Chronic pain Modic 1 endplate changes. DJD Pt used to be on Celebrex x 20 years - then stopped Norco prn - rare  Potential benefits of a long term opioids use as well as potential risks (i.e. addiction risk, apnea etc) and complications (i.e. Somnolence, constipation and others) were explained to the patient and were aknowledged.

## 2019-04-19 NOTE — Assessment & Plan Note (Signed)
S/p remote event w/GYN surgery

## 2019-04-19 NOTE — Assessment & Plan Note (Addendum)
>  18 years Vit D Yoga BDS

## 2019-04-19 NOTE — Patient Instructions (Addendum)
You can pre-medicate yourself with Benadryl 25 mg and Tylenol 650 mg 1 hour prior to the vaccination.     We are committed to keeping you informed about the COVID-19 vaccine.  As the vaccine continues to become available for each phase, we will ensure that patients who meet the criteria receive the information they need to access vaccination opportunities. Continue to check your MyChart account and [email or web address] for updates. Please review the Phase 1b information below.  Following [place]'s guidelines for the distribution of COVID-19 vaccines, we are pleased to share our plans to begin offering vaccines to those 75 and older (Phase 1b). Here are details of those plans:  [place] On Tuesday, Jan. 19, the [facility]) and [place] begin large-scale COVID-19 vaccinations at the [place]. The vaccinations are appointment only and for those 24 and older.  It is expected that 750 will initially be vaccinated per day [facility]. Capacity is expected to grow in the weeks ahead. However, the number of reservations accepted depends on the amount of vaccine available.  Online or Phone Registration Only Walk-ins will NOT be accepted. Registration will open on Friday, January 15.   Health Department Registration [facility] residents only)  [place] (Any local residents)  Other Counties We are also working in partnership with county health agencies in [place], [place] and [place] counties to ensure continuing vaccination availability in alignment with state guidelines in the weeks and months ahead. Information on phase 1b COVID-19 vaccination clinics being offered by local county health agencies is provided in the website links below for your convenience:  [place]'s phase 1b  vaccination guidelines, prioritizing those 75 and over as the next eligible group to receive the COVID-19 vaccine, are detailed at [email or web address].   Vaccine Safety and Effectiveness Clinical trials for the Pfizer COVID-19 vaccine involved 42,000 people and showed that the vaccine is more than 95% effective in preventing COVID-19 with no serious safety concerns. Similar results have been reported for the Moderna COVID-19 vaccine. Side effects reported in the [place] clinical trials include a sore arm at the injection site, fatigue, headache, chills and fever. While side effects from the [place] COVID-19 vaccine are higher than for a typical flu vaccine, they are lower in many ways than side effects from the leading vaccine to prevent shingles. Side effects are signs that a vaccine is working and are related to your immune system being stimulated to produce antibodies against infection. Side effects from vaccination are far less significant than health impacts from COVID-19.  Staying Informed Pharmacists, infectious disease doctors, critical care nurses and other experts at [facility] continue to speak publicly through media interviews and direct communication with our patients and communities about the safety, effectiveness and importance of vaccines to eliminate COVID-19. In addition, reliable information on vaccine safety, effectiveness, side effects and more is available on the following websites:  [place] Department of Health and Human Services COVID-19 Vaccine Information Website.  U.S. Centers for Disease Control and Prevention [id number] Human resources officer.  Staying Safe We agree with the CDC on what we can do to help our communities get back to normal: Getting "back to normal" is going to take all of our tools. If we use all the tools we have, we stand the best chance of getting our families, communities, schools and workplaces "back to normal"  sooner:  Get vaccinated as  soon as vaccines become available within the phase of the state's vaccination rollout plan for which you meet the eligibility criteria.  Wear a mask.  Stay 6 feet from others and avoid crowds.  Wash hands often.  For our most current information, please visit [email or web address].  --------------------------------------------------------------------------------------------------- Cardiac CT calcium scoring test $150 Tel # is [phone]   Computed tomography, more commonly known as a CT or CAT scan, is a diagnostic medical imaging test. Like traditional x-rays, it produces multiple images or pictures of the inside of the body. The cross-sectional images generated during a CT scan can be reformatted in multiple planes. They can even generate three-dimensional images. These images can be viewed on a computer monitor, printed on film or by a 3D printer, or transferred to a CD or DVD. CT images of internal organs, bones, soft tissue and blood vessels provide greater detail than traditional x-rays, particularly of soft tissues and blood vessels. A cardiac CT scan for coronary calcium is a non-invasive way of obtaining information about the presence, location and extent of calcified plaque in the coronary arteries--the vessels that supply oxygen-containing blood to the heart muscle. Calcified plaque results when there is a build-up of fat and other substances under the inner layer of the artery. This material can calcify which signals the presence of atherosclerosis, a disease of the vessel wall, also called coronary artery disease (CAD). People with this disease have an increased risk for heart attacks. In addition, over time, progression of plaque build up (CAD) can narrow the arteries or even close off blood flow to the heart. The result may be chest pain, sometimes called "angina," or a heart attack. Because calcium is a marker of CAD, the amount of calcium detected on a cardiac CT scan is  a helpful prognostic tool. The findings on cardiac CT are expressed as a calcium score. Another name for this test is coronary artery calcium scoring.  What are some common uses of the procedure? The goal of cardiac CT scan for calcium scoring is to determine if CAD is present and to what extent, even if there are no symptoms. It is a screening study that may be recommended by a physician for patients with risk factors for CAD but no clinical symptoms. The major risk factors for CAD are: . high blood cholesterol levels  . family history of heart attacks  . diabetes  . high blood pressure  . cigarette smoking  . overweight or obese  . physical inactivity   A negative cardiac CT scan for calcium scoring shows no calcification within the coronary arteries. This suggests that CAD is absent or so minimal it cannot be seen by this technique. The chance of having a heart attack over the next two to five years is very low under these circumstances. A positive test means that CAD is present, regardless of whether or not the patient is experiencing any symptoms. The amount of calcification--expressed as the calcium score--may help to predict the likelihood of a myocardial infarction (heart attack) in the coming years and helps your medical doctor or cardiologist decide whether the patient may need to take preventive medicine or undertake other measures such as diet and exercise to lower the risk for heart attack. The extent of CAD is graded according to your calcium score:  Calcium Score  Presence of CAD (coronary artery disease)  0 No evidence of CAD   1-10 Minimal evidence of CAD  11-100 Mild evidence  of CAD  101-400 Moderate evidence of CAD  Over 400 Extensive evidence of CAD     Occipital Neuralgia  Occipital neuralgia is a type of headache that causes brief episodes of very bad pain in the back of your head. Pain from occipital neuralgia may spread (radiate) to other parts of your  head. These headaches may be caused by irritation of the nerves that leave your spinal cord high up in your neck, just below the base of your skull (occipital nerves). Your occipital nerves transmit sensations from the back of your head, the top of your head, and the areas behind your ears. What are the causes? This condition can occur without any known cause (primary headache syndrome). In other cases, this condition is caused by pressure on or irritation of one of the two occipital nerves. Pressure and irritation may be due to:  Muscle spasm in the neck.  Neck injury.  Wear and tear of the vertebrae in the neck (osteoarthritis).  Disease of the disks that separate the vertebrae.  Swollen blood vessels that put pressure on the occipital nerves.  Infections.  Tumors.  Diabetes. What are the signs or symptoms? This condition causes brief burning, stabbing, electric, shocking, or shooting pain which can radiate to the top of the head. It can happen on one side or both sides of the head. It can also cause:  Pain behind the eye.  Pain triggered by neck movement or hair brushing.  Scalp tenderness.  Aching in the back of the head between episodes of very bad pain.  Pain gets worse with exposure to bright lights. How is this diagnosed? There is no test that diagnoses this condition. Your health care provider may diagnose this condition based on a physical exam and your symptoms. Other tests may be done, such as:  Imaging studies of the brain and neck (cervical spine), such as an MRI or CT scan. These look for causes of pinched nerves.  Applying pressure to the nerves in the neck to try to re-create the pain.  Injection of numbing medicine into the occipital nerve areas to see if pain goes away (diagnostic nerve block). How is this treated? Treatment for this condition may begin with simple measures, such as:  Rest.  Massage.  Applying heat or cold on the  area.  Over-the-counter pain relievers. If these measures do not work, you may need other treatments, including:  Medicines, such as: ? Prescription-strength anti-inflammatory medicines. ? Muscle relaxants. ? Anti-seizure medicines, which can relieve pain. ? Antidepressants, which can relieve pain. ? Injected medicines, such as medicines that numb the area (local anesthetic) and steroids.  Pulsed radiofrequency ablation. This is when wires are implanted to deliver electrical impulses that block pain signals from the occipital nerve.  Surgery to relieve nerve pressure.  Physical therapy. Follow these instructions at home: Pain management      Avoid any activities that cause pain.  Rest when you have an attack of pain.  Try gentle massage to relieve pain.  Try a different pillow or sleeping position.  If directed, apply heat to the affected area as told by your health care provider. Use the heat source that your health care provider recommends, such as a moist heat pack or a heating pad. ? Place a towel between your skin and the heat source. ? Leave the heat on for 20-30 minutes. ? Remove the heat if your skin turns bright red. This is especially important if you are unable to feel  pain, heat, or cold. You may have a greater risk of getting burned.  If directed, apply ice to the back of the head and neck area as told by your health care provider. ? Put ice in a plastic bag. ? Place a towel between your skin and the bag. ? Leave the ice on for 20 minutes, 2-3 times per day. General instructions  Take over-the-counter and prescription medicines only as told by your health care provider.  Avoid things that make your symptoms worse, such as bright lights.  Try to stay active. Get regular exercise that does not cause pain. Ask your health care provider to suggest safe exercises for you.  Work with a physical therapist to learn stretching exercises you can do at  home.  Practice good posture.  Keep all follow-up visits as told by your health care provider. This is important. Contact a health care provider if:  Your medicine is not working.  You have new or worsening symptoms. Get help right away if:  You have very bad head pain that does not go away.  You have a sudden change in vision, balance, or speech. Summary  Occipital neuralgia is a type of headache that causes brief episodes of very bad pain in the back of your head.  Pain from occipital neuralgia may spread (radiate) to other parts of your head.  Treatment for this condition includes rest, massage, and medicines. This information is not intended to replace advice given to you by your health care provider. Make sure you discuss any questions you have with your health care provider. Document Revised: 03/08/2017 Document Reviewed: 05/27/2016 Elsevier Patient Education  [employer].   .av

## 2019-04-19 NOTE — Assessment & Plan Note (Signed)
GERD causing dry cough - Prilosec prn

## 2019-04-20 ENCOUNTER — Other Ambulatory Visit (INDEPENDENT_AMBULATORY_CARE_PROVIDER_SITE_OTHER): Payer: PPO

## 2019-04-20 ENCOUNTER — Other Ambulatory Visit: Payer: PPO

## 2019-04-20 DIAGNOSIS — E559 Vitamin D deficiency, unspecified: Secondary | ICD-10-CM | POA: Diagnosis not present

## 2019-04-20 DIAGNOSIS — Z780 Asymptomatic menopausal state: Secondary | ICD-10-CM | POA: Diagnosis not present

## 2019-04-20 DIAGNOSIS — R202 Paresthesia of skin: Secondary | ICD-10-CM | POA: Diagnosis not present

## 2019-04-20 LAB — URINALYSIS, ROUTINE W REFLEX MICROSCOPIC
Bilirubin Urine: NEGATIVE
Hgb urine dipstick: NEGATIVE
Ketones, ur: NEGATIVE
Nitrite: NEGATIVE
RBC / HPF: NONE SEEN (ref 0–?)
Specific Gravity, Urine: 1.02 (ref 1.000–1.030)
Total Protein, Urine: NEGATIVE
Urine Glucose: NEGATIVE
Urobilinogen, UA: 0.2 (ref 0.0–1.0)
pH: 6 (ref 5.0–8.0)

## 2019-04-20 LAB — CBC WITH DIFFERENTIAL/PLATELET
Basophils Absolute: 0.1 10*3/uL (ref 0.0–0.1)
Basophils Relative: 1.1 % (ref 0.0–3.0)
Eosinophils Absolute: 0 10*3/uL (ref 0.0–0.7)
Eosinophils Relative: 0 % (ref 0.0–5.0)
HCT: 38.6 % (ref 36.0–46.0)
Hemoglobin: 12.9 g/dL (ref 12.0–15.0)
Lymphocytes Relative: 11.7 % — ABNORMAL LOW (ref 12.0–46.0)
Lymphs Abs: 1.3 10*3/uL (ref 0.7–4.0)
MCHC: 33.4 g/dL (ref 30.0–36.0)
MCV: 93.5 fl (ref 78.0–100.0)
Monocytes Absolute: 0.4 10*3/uL (ref 0.1–1.0)
Monocytes Relative: 3.8 % (ref 3.0–12.0)
Neutro Abs: 9.4 10*3/uL — ABNORMAL HIGH (ref 1.4–7.7)
Neutrophils Relative %: 83.4 % — ABNORMAL HIGH (ref 43.0–77.0)
Platelets: 384 10*3/uL (ref 150.0–400.0)
RBC: 4.13 Mil/uL (ref 3.87–5.11)
RDW: 13.4 % (ref 11.5–15.5)
WBC: 11.3 10*3/uL — ABNORMAL HIGH (ref 4.0–10.5)

## 2019-04-20 LAB — HEPATIC FUNCTION PANEL
ALT: 17 U/L (ref 0–35)
AST: 15 U/L (ref 0–37)
Albumin: 4.3 g/dL (ref 3.5–5.2)
Alkaline Phosphatase: 82 U/L (ref 39–117)
Bilirubin, Direct: 0.1 mg/dL (ref 0.0–0.3)
Total Bilirubin: 0.5 mg/dL (ref 0.2–1.2)
Total Protein: 7.5 g/dL (ref 6.0–8.3)

## 2019-04-20 LAB — VITAMIN D 25 HYDROXY (VIT D DEFICIENCY, FRACTURES): VITD: 23.85 ng/mL — ABNORMAL LOW (ref 30.00–100.00)

## 2019-04-20 LAB — BASIC METABOLIC PANEL
BUN: 18 mg/dL (ref 6–23)
CO2: 28 mEq/L (ref 19–32)
Calcium: 9.6 mg/dL (ref 8.4–10.5)
Chloride: 101 mEq/L (ref 96–112)
Creatinine, Ser: 0.67 mg/dL (ref 0.40–1.20)
GFR: 87.07 mL/min (ref >60.00)
Glucose, Bld: 107 mg/dL — ABNORMAL HIGH (ref 70–99)
Potassium: 4.4 mEq/L (ref 3.5–5.1)
Sodium: 139 mEq/L (ref 135–145)

## 2019-04-20 LAB — LIPID PANEL
Cholesterol: 247 mg/dL — ABNORMAL HIGH (ref 0–200)
HDL: 59.1 mg/dL (ref >39.00)
LDL Cholesterol: 166 mg/dL — ABNORMAL HIGH (ref 0–99)
NonHDL: 187.77
Total CHOL/HDL Ratio: 4
Triglycerides: 111 mg/dL (ref 0.0–149.0)
VLDL: 22.2 mg/dL (ref 0.0–40.0)

## 2019-04-20 LAB — VITAMIN B12: Vitamin B-12: 282 pg/mL (ref 211–911)

## 2019-04-20 LAB — TSH: TSH: 1.36 u[IU]/mL (ref 0.35–4.50)

## 2019-04-23 ENCOUNTER — Other Ambulatory Visit: Payer: Self-pay | Admitting: Internal Medicine

## 2019-04-23 MED ORDER — VITAMIN D3 50 MCG (2000 UT) PO CAPS: 2000.0000 [IU] | ORAL_CAPSULE | Freq: Every day | ORAL | 3 refills | Status: DC

## 2019-04-23 MED ORDER — VITAMIN D3 1.25 MG (50000 UT) PO CAPS: 1.0000 | ORAL_CAPSULE | ORAL | 0 refills | Status: DC

## 2019-04-23 MED ORDER — B COMPLEX PO TABS: 1.0000 | ORAL_TABLET | Freq: Every day | ORAL | 3 refills | Status: AC

## 2019-04-23 MED FILL — VIT D3-50 50,000 UNITS CAPS: 1.25 MG | 56 days supply | Qty: 8 | Fill #0

## 2019-04-24 ENCOUNTER — Other Ambulatory Visit: Payer: Self-pay | Admitting: Internal Medicine

## 2019-04-24 ENCOUNTER — Encounter: Payer: Self-pay | Admitting: Internal Medicine

## 2019-04-24 DIAGNOSIS — E559 Vitamin D deficiency, unspecified: Secondary | ICD-10-CM | POA: Insufficient documentation

## 2019-04-24 DIAGNOSIS — M1990 Unspecified osteoarthritis, unspecified site: Secondary | ICD-10-CM | POA: Insufficient documentation

## 2019-04-24 MED ORDER — CELECOXIB 200 MG PO CAPS: 200.0000 mg | ORAL_CAPSULE | Freq: Every day | ORAL | 3 refills | Status: DC | PRN

## 2019-04-24 MED FILL — CELECOXIB 200 MG CAP: 200 | 90 days supply | Qty: 90 | Fill #0

## 2019-04-24 NOTE — Assessment & Plan Note (Signed)
Start vitamin D by prescription

## 2019-04-24 NOTE — Assessment & Plan Note (Signed)
Diffuse and chronic. Treat vitamin D deficiency Okay to restart Celebrex

## 2019-04-30 ENCOUNTER — Ambulatory Visit: Payer: PPO | Attending: Internal Medicine

## 2019-04-30 DIAGNOSIS — Z23 Encounter for immunization: Secondary | ICD-10-CM | POA: Insufficient documentation

## 2019-04-30 NOTE — Progress Notes (Signed)
   Covid-19 Vaccination Clinic  Name:  SHAMEL GALYEAN    MRN: [id number] DOB: 1951/01/06  04/30/2019  Ms. Hendrickson was observed post Covid-19 immunization for 15 minutes without incidence. She was provided with Vaccine Information Sheet and instruction to access the V-Safe system.   Ms. Kohlman was instructed to call 911 with any severe reactions post vaccine: Marland Kitchen Difficulty breathing  . Swelling of your face and throat  . A fast heartbeat  . A bad rash all over your body  . Dizziness and weakness    Immunizations Administered    Name Date Dose VIS Date Route   Pfizer COVID-19 Vaccine 04/30/2019  2:01 PM 0.3 mL 03/16/2019 Intramuscular   Manufacturer: [employer], [employer]   Lot: [id number]   NDC: [address]-2

## 2019-05-03 ENCOUNTER — Other Ambulatory Visit: Payer: Self-pay

## 2019-05-03 ENCOUNTER — Encounter: Payer: Self-pay | Admitting: Internal Medicine

## 2019-05-03 ENCOUNTER — Ambulatory Visit (INDEPENDENT_AMBULATORY_CARE_PROVIDER_SITE_OTHER): Payer: PPO | Admitting: Internal Medicine

## 2019-05-03 VITALS — BP 128/80 | HR 74 | Temp 98.0°F | Ht 63.5 in | Wt 142.1 lb

## 2019-05-03 DIAGNOSIS — R739 Hyperglycemia, unspecified: Secondary | ICD-10-CM | POA: Diagnosis not present

## 2019-05-03 DIAGNOSIS — E559 Vitamin D deficiency, unspecified: Secondary | ICD-10-CM | POA: Diagnosis not present

## 2019-05-03 DIAGNOSIS — E538 Deficiency of other specified B group vitamins: Secondary | ICD-10-CM | POA: Diagnosis not present

## 2019-05-03 DIAGNOSIS — M5481 Occipital neuralgia: Secondary | ICD-10-CM | POA: Diagnosis not present

## 2019-05-03 NOTE — Assessment & Plan Note (Signed)
On B12 

## 2019-05-03 NOTE — Assessment & Plan Note (Signed)
A1c

## 2019-05-03 NOTE — Assessment & Plan Note (Addendum)
Will inject if needed RTC 3 mo Celebrex po HA is better

## 2019-05-03 NOTE — Progress Notes (Signed)
Subjective:  Patient ID: Leah Bird, female    DOB: 11-Dec-1950  Age: 69 y.o. MRN: [id number]  CC: No chief complaint on file.   HPI Leah Bird presents for OA, neck pain, Vit D def OA is better after Medrol pack - now back on Celebrex HA is better  Outpatient Medications Prior to Visit  Medication Sig Dispense Refill  . b complex vitamins tablet Take 1 tablet by mouth daily. 100 tablet 3  . celecoxib (CELEBREX) 200 MG capsule Take 1 capsule (200 mg total) by mouth daily as needed for moderate pain. 90 capsule 3  . Cholecalciferol (VITAMIN D3) 1.25 MG (50000 UT) CAPS Take 1 capsule by mouth once a week. 8 capsule 0  . Cholecalciferol (VITAMIN D3) 50 MCG (2000 UT) capsule Take 1 capsule (2,000 Units total) by mouth daily. 100 capsule 3  . HYDROcodone-acetaminophen (NORCO) 5-325 MG per tablet Take 0.5 tablets by mouth daily as needed. For pain    . methylPREDNISolone (MEDROL DOSEPAK) 4 MG TBPK tablet As directed 21 tablet 0  . Naproxen Sodium (ALEVE PO) Take 2 tablets by mouth as needed.    Marland Kitchen omeprazole (PRILOSEC) 20 MG capsule     . SYMBICORT 160-4.5 MCG/ACT inhaler     . methocarbamol (ROBAXIN) 500 MG tablet      No facility-administered medications prior to visit.    ROS: Review of Systems  Constitutional: Negative for activity change, appetite change, chills, fatigue and unexpected weight change.  HENT: Negative for congestion, mouth sores and sinus pressure.   Eyes: Negative for visual disturbance.  Respiratory: Negative for cough and chest tightness.   Gastrointestinal: Negative for abdominal pain and nausea.  Genitourinary: Negative for difficulty urinating, frequency and vaginal pain.  Musculoskeletal: Positive for arthralgias, neck pain and neck stiffness. Negative for back pain and gait problem.  Skin: Negative for pallor and rash.  Neurological: Positive for headaches. Negative for dizziness, tremors, weakness and numbness.  Psychiatric/Behavioral: Negative for  confusion and sleep disturbance.    Objective:  BP 128/80 (BP Location: Left Arm, Patient Position: Sitting, Cuff Size: Normal)   Pulse 74   Temp 98 F (36.7 C) (Oral)   Ht 5' 3.5" (1.613 m)   Wt 142 lb 2 oz (64.5 kg)   SpO2 98%   BMI 24.78 kg/m   BP Readings from Last 3 Encounters:  05/03/19 128/80  04/19/19 120/62  09/22/12 112/77    Wt Readings from Last 3 Encounters:  05/03/19 142 lb 2 oz (64.5 kg)  04/19/19 145 lb (65.8 kg)  09/22/12 142 lb (64.4 kg)    Physical Exam Constitutional:      General: She is not in acute distress.    Appearance: She is well-developed.  HENT:     Head: Normocephalic.     Right Ear: External ear normal.     Left Ear: External ear normal.     Nose: Nose normal.  Eyes:     General:        Right eye: No discharge.        Left eye: No discharge.     Conjunctiva/sclera: Conjunctivae normal.     Pupils: Pupils are equal, round, and reactive to light.  Neck:     Thyroid: No thyromegaly.     Vascular: No JVD.     Trachea: No tracheal deviation.  Cardiovascular:     Rate and Rhythm: Normal rate and regular rhythm.     Heart sounds: Normal heart sounds.  Pulmonary:  Effort: No respiratory distress.     Breath sounds: No stridor. No wheezing.  Abdominal:     General: Bowel sounds are normal. There is no distension.     Palpations: Abdomen is soft. There is no mass.     Tenderness: There is no abdominal tenderness. There is no guarding or rebound.  Musculoskeletal:        General: Tenderness present.     Cervical back: Normal range of motion and neck supple.  Lymphadenopathy:     Cervical: No cervical adenopathy.  Skin:    Findings: No erythema or rash.  Neurological:     Cranial Nerves: No cranial nerve deficit.     Motor: No abnormal muscle tone.     Coordination: Coordination normal.     Deep Tendon Reflexes: Reflexes normal.  Psychiatric:        Behavior: Behavior normal.        Thought Content: Thought content normal.         Judgment: Judgment normal.   neck w/better ROM Small LNs on the neck - chronic per pt  Lab Results  Component Value Date   WBC 11.3 (H) 04/20/2019   HGB 12.9 04/20/2019   HCT 38.6 04/20/2019   PLT 384.0 04/20/2019   GLUCOSE 107 (H) 04/20/2019   CHOL 247 (H) 04/20/2019   TRIG 111.0 04/20/2019   HDL 59.10 04/20/2019   LDLCALC 166 (H) 04/20/2019   ALT 17 04/20/2019   AST 15 04/20/2019   NA 139 04/20/2019   K 4.4 04/20/2019   CL 101 04/20/2019   CREATININE 0.67 04/20/2019   BUN 18 04/20/2019   CO2 28 04/20/2019   TSH 1.36 04/20/2019   INR 0.91 04/23/2011    DG Sinuses Complete  Result Date: 02/19/2019 CLINICAL DATA:  Congestion. EXAM: PARANASAL SINUSES - COMPLETE 3 + VIEW COMPARISON:  None. FINDINGS: The paranasal sinus are aerated. There is no evidence of sinus opacification air-fluid levels or mucosal thickening. No significant bone abnormalities are seen. IMPRESSION: Negative. Electronically Signed   By: Constance Holster M.D.   On: 02/19/2019 20:05   DG Chest 2 View  Result Date: 02/19/2019 CLINICAL DATA:  Chronic cough. EXAM: CHEST - 2 VIEW COMPARISON:  Aug 31, 2017 FINDINGS: There is sclerosis of the medial right clavicle which is relatively stable when compared to Aug 31, 2017 but has substantially progressed since 2018. There is pleuroparenchymal scarring at the lung apices. The heart size is stable from prior study. There is no pneumothorax. No focal infiltrate. No large pleural effusion. IMPRESSION: 1. No definite acute cardiopulmonary process. 2. Increasing sclerosis in the medial right clavicle of unknown clinical significance. This is relatively stable since 2019 but has significantly progressed since 2018. Consider further evaluation with CT as clinically indicated. Electronically Signed   By: Constance Holster M.D.   On: 02/19/2019 20:04    Assessment & Plan:   There are no diagnoses linked to this encounter.   No orders of the defined types were placed  in this encounter.    Follow-up: No follow-ups on file.  Walker Kehr, MD

## 2019-05-21 ENCOUNTER — Ambulatory Visit: Payer: PPO | Attending: Internal Medicine

## 2019-05-21 DIAGNOSIS — Z23 Encounter for immunization: Secondary | ICD-10-CM

## 2019-05-21 NOTE — Progress Notes (Signed)
   Covid-19 Vaccination Clinic  Name:  Leah Bird    MRN: [id number] DOB: 25-Apr-1950  05/21/2019  [name] was observed post Covid-19 immunization for 15 minutes without incidence. She was provided with Vaccine Information Sheet and instruction to access the V-Safe system.   [name] was instructed to call 911 with any severe reactions post vaccine: Marland Kitchen Difficulty breathing  . Swelling of your face and throat  . A fast heartbeat  . A bad rash all over your body  . Dizziness and weakness    Immunizations Administered    Name Date Dose VIS Date Route   Pfizer COVID-19 Vaccine 05/21/2019  1:40 PM 0.3 mL 03/16/2019 Intramuscular   Manufacturer: [employer], [employer]   Lot: [id number]   NDC: [address]-7

## 2019-05-23 MED FILL — SYMBICORT 160-4.5 MCG INH: 160-4.5 | 90 days supply | Qty: 10 | Fill #1

## 2019-07-15 ENCOUNTER — Other Ambulatory Visit: Payer: Self-pay | Admitting: Internal Medicine

## 2019-07-15 DIAGNOSIS — E785 Hyperlipidemia, unspecified: Secondary | ICD-10-CM

## 2019-07-16 NOTE — Progress Notes (Signed)
Subjective:    Patient ID: Leah Bird, female    DOB: Nov 17, 1950, 69 y.o.   MRN: [id number]  HPI The patient is here for an acute visit.   Left leg pain and swelling from posterior knee to ankle   She has had some funny feeling in the posterior left knee for a while - puffiness, not pain.   She noticed her left ankle was slightly swolllen when she was in [place].  She was in [place] March 12- 27.    This past weekend she had pain in the posterior right knee and calf.  The pain was more severe when she was standing and slightly less severe with walking.  She is swelling behind the knee down to her ankle.  There is some mild redness on the anterior lower leg that feels slightly warm.  Today she has noticed some pain in the left hamstring region.     Just over one week she had diarrhea and had an associated fever.  She does not think this is related.  She denies any fevers, chest pain, shortness of breath or palpitations.  Medications and allergies reviewed with patient and updated if appropriate.  Patient Active Problem List   Diagnosis Date Noted  . Low serum vitamin B12 05/03/2019  . Hyperglycemia 05/03/2019  . Vitamin D deficiency 04/24/2019  . Osteoarthritis 04/24/2019  . Occipital neuralgia of left side 04/19/2019  . Neck pain 04/19/2019  . Menopause 04/19/2019  . Pseudocholinesterase deficiency 04/19/2019  . GERD (gastroesophageal reflux disease) 04/19/2019    Current Outpatient Medications on File Prior to Visit  Medication Sig Dispense Refill  . b complex vitamins tablet Take 1 tablet by mouth daily. 100 tablet 3  . celecoxib (CELEBREX) 200 MG capsule Take 1 capsule (200 mg total) by mouth daily as needed for moderate pain. 90 capsule 3  . Cholecalciferol (VITAMIN D3) 50 MCG (2000 UT) capsule Take 1 capsule (2,000 Units total) by mouth daily. 100 capsule 3  . HYDROcodone-acetaminophen (NORCO) 5-325 MG per tablet Take 0.5 tablets by mouth daily as needed.  For pain    . Naproxen Sodium (ALEVE PO) Take 2 tablets by mouth as needed.    Marland Kitchen omeprazole (PRILOSEC) 20 MG capsule     . SYMBICORT 160-4.5 MCG/ACT inhaler      No current facility-administered medications on file prior to visit.    Past Medical History:  Diagnosis Date  . Arthritis   . Chicken pox   . Degenerative disk disease    whole spine-modic 1 spinal disease  . History of UTI   . No pertinent past medical history     Past Surgical History:  Procedure Laterality Date  . DILATION AND CURETTAGE OF UTERUS  1990   Reacted to anesthesia/was on life support  . EYE SURGERY    . TUBAL LIGATION    . WISDOM TOOTH EXTRACTION     under sedation    Social History   Socioeconomic History  . Marital status: Married    Spouse name: Not on file  . Number of children: Not on file  . Years of education: Not on file  . Highest education level: Not on file  Occupational History  . Not on file  Tobacco Use  . Smoking status: Never Smoker  . Smokeless tobacco: Never Used  Substance and Sexual Activity  . Alcohol use: Yes    Alcohol/week: 4.0 standard drinks    Types: 4 Glasses of wine per week  .  Drug use: No  . Sexual activity: Not on file  Other Topics Concern  . Not on file  Social History Narrative  . Not on file   Social Determinants of Health   Financial Resource Strain:   . Difficulty of Paying Living Expenses:   Food Insecurity:   . Worried About Programme researcher, broadcasting/film/video in the Last Year:   . Barista in the Last Year:   Transportation Needs:   . Freight forwarder (Medical):   Marland Kitchen Lack of Transportation (Non-Medical):   Physical Activity:   . Days of Exercise per Week:   . Minutes of Exercise per Session:   Stress:   . Feeling of Stress :   Social Connections:   . Frequency of Communication with Friends and Family:   . Frequency of Social Gatherings with Friends and Family:   . Attends Religious Services:   . Active Member of Clubs or Organizations:    . Attends Banker Meetings:   Marland Kitchen Marital Status:     Family History  Problem Relation Age of Onset  . Heart disease Mother   . Arthritis Mother   . Heart disease Father   . Kidney disease Father   . Heart attack Father   . Arthritis Sister   . Depression Sister   . Diabetes Sister   . Asthma Son   . Hearing loss Son   . Learning disabilities Son   . Arthritis Maternal Grandmother   . Hearing loss Maternal Grandfather   . Hearing loss Paternal Grandfather   . Heart attack Paternal Grandfather   . Colon cancer Neg Hx   . Rectal cancer Neg Hx   . Stomach cancer Neg Hx     Review of Systems  Constitutional: Negative for fever.  Respiratory: Negative for shortness of breath.   Cardiovascular: Negative for chest pain and palpitations.  Neurological: Negative for numbness.       Objective:   Vitals:   07/17/19 1357  BP: (!) 146/90  Pulse: 80  Resp: 16  Temp: 99.4 F (37.4 C)  SpO2: 97%   BP Readings from Last 3 Encounters:  07/17/19 (!) 146/90  05/03/19 128/80  04/19/19 120/62   Wt Readings from Last 3 Encounters:  07/17/19 141 lb (64 kg)  05/03/19 142 lb 2 oz (64.5 kg)  04/19/19 145 lb (65.8 kg)   Body mass index is 24.59 kg/m.   Physical Exam Constitutional:      General: She is not in acute distress.    Appearance: Normal appearance. She is not ill-appearing.  HENT:     Head: Normocephalic and atraumatic.  Musculoskeletal:     Comments: Tenderness with light palpation left popliteal region, tenderness posterior calf, positive Homans' sign, swelling posterior knee and lower leg-moderate in nature, positive tenderness left posterior upper leg  Skin:    General: Skin is warm and dry.     Findings: Erythema (Mild erythema anterior left shin-slightly warm to touch) present. No bruising, lesion or rash.  Neurological:     Mental Status: She is alert.            Assessment & Plan:    See Problem List for Assessment and Plan of  chronic medical problems.     This visit occurred during the SARS-CoV-2 public health emergency.  Safety protocols were in place, including screening questions prior to the visit, additional usage of staff PPE, and extensive cleaning of exam room while observing appropriate contact  time as indicated for disinfecting solutions.

## 2019-07-17 ENCOUNTER — Encounter (HOSPITAL_COMMUNITY): Payer: Self-pay

## 2019-07-17 ENCOUNTER — Emergency Department (HOSPITAL_COMMUNITY)
Admission: EM | Admit: 2019-07-17 | Discharge: 2019-07-17 | Disposition: A | Discharge status: Home / Self Care | Payer: PPO | Attending: Emergency Medicine | Admitting: Emergency Medicine

## 2019-07-17 ENCOUNTER — Encounter (HOSPITAL_COMMUNITY): Payer: Self-pay | Admitting: Emergency Medicine

## 2019-07-17 ENCOUNTER — Other Ambulatory Visit: Payer: Self-pay

## 2019-07-17 ENCOUNTER — Encounter: Payer: Self-pay | Admitting: Internal Medicine

## 2019-07-17 ENCOUNTER — Ambulatory Visit (INDEPENDENT_AMBULATORY_CARE_PROVIDER_SITE_OTHER): Payer: PPO | Admitting: Internal Medicine

## 2019-07-17 ENCOUNTER — Ambulatory Visit (HOSPITAL_BASED_OUTPATIENT_CLINIC_OR_DEPARTMENT_OTHER)
Admission: RE | Admit: 2019-07-17 | Discharge: 2019-07-17 | Disposition: A | Discharge status: Home / Self Care | Payer: PPO | Source: Ambulatory Visit | Attending: Cardiovascular Disease | Admitting: Cardiovascular Disease

## 2019-07-17 VITALS — BP 146/90 | HR 80 | Temp 99.4°F | Resp 16 | Ht 63.5 in | Wt 141.0 lb

## 2019-07-17 DIAGNOSIS — R600 Localized edema: Secondary | ICD-10-CM | POA: Diagnosis not present

## 2019-07-17 DIAGNOSIS — M79605 Pain in left leg: Secondary | ICD-10-CM | POA: Insufficient documentation

## 2019-07-17 DIAGNOSIS — I82492 Acute embolism and thrombosis of other specified deep vein of left lower extremity: Secondary | ICD-10-CM

## 2019-07-17 DIAGNOSIS — Z79899 Other long term (current) drug therapy: Secondary | ICD-10-CM | POA: Insufficient documentation

## 2019-07-17 DIAGNOSIS — I82402 Acute embolism and thrombosis of unspecified deep veins of left lower extremity: Secondary | ICD-10-CM

## 2019-07-17 MED ORDER — RIVAROXABAN (XARELTO) EDUCATION KIT FOR DVT/PE PATIENTS: PACK | Freq: Once | Status: DC

## 2019-07-17 MED ORDER — RIVAROXABAN (XARELTO) EDUCATION KIT FOR DVT/PE PATIENTS
PACK | Freq: Once | Status: AC
  Filled 2019-07-17: qty 1

## 2019-07-17 MED ORDER — RIVAROXABAN 20 MG PO TABS: 20.0000 mg | ORAL_TABLET | Freq: Every day | ORAL | Status: DC

## 2019-07-17 MED ORDER — RIVAROXABAN 15 MG PO TABS: 15.0000 mg | ORAL_TABLET | Freq: Two times a day (BID) | ORAL | Status: DC

## 2019-07-17 MED ORDER — RIVAROXABAN 15 MG PO TABS
15.0000 mg | ORAL_TABLET | Freq: Two times a day (BID) | ORAL | Status: DC
  Administered 2019-07-17: 21:00:00 15 mg via ORAL
  Filled 2019-07-17 (×2): qty 1

## 2019-07-17 MED ORDER — RIVAROXABAN (XARELTO) VTE STARTER PACK (15 & 20 MG): ORAL_TABLET | ORAL | 0 refills | Status: DC

## 2019-07-17 NOTE — Patient Instructions (Addendum)
A ultrasound of your leg was ordered.  This will rule out a DVT.   We will call you today with the results and discuss treatment.

## 2019-07-17 NOTE — Assessment & Plan Note (Signed)
Acute visit Left leg pain and swelling Concern for Baker's cyst rupture versus DVT Vascular ultrasound stat-further treatment based on results

## 2019-07-17 NOTE — ED Provider Notes (Signed)
[facility] EMERGENCY DEPARTMENT Provider Note   CSN: [id number] Arrival date & time: 07/17/19  1555     History Chief Complaint  Patient presents with   DVT    Leah Bird is a 69 y.o. female.  Patient is a 69 year old female with history of hyperglycemia, osteoarthritis, GERD who presents the emergency department for DVT.  Patient had left lower leg swelling after a long trip and an outpatient DVT study was ordered.  This subsequently was positive for a DVT in the left femoral vein extending to the left gastrocnemius.  Her primary medical doctor advised her to come to the emergency department for further evaluation.  The patient reports significant pain.  She denies any shortness of breath, chest pain, syncope.        Past Medical History:  Diagnosis Date   Arthritis    Chicken pox    Degenerative disk disease    whole spine-modic 1 spinal disease   History of UTI    No pertinent past medical history     Patient Active Problem List   Diagnosis Date Noted   Left leg pain 07/17/2019   Low serum vitamin B12 05/03/2019   Hyperglycemia 05/03/2019   Vitamin D deficiency 04/24/2019   Osteoarthritis 04/24/2019   Occipital neuralgia of left side 04/19/2019   Neck pain 04/19/2019   Menopause 04/19/2019   Pseudocholinesterase deficiency 04/19/2019   GERD (gastroesophageal reflux disease) 04/19/2019    Past Surgical History:  Procedure Laterality Date   DILATION AND CURETTAGE OF UTERUS  1990   Reacted to anesthesia/was on life support   EYE SURGERY     TUBAL LIGATION     WISDOM TOOTH EXTRACTION     under sedation     OB History   No obstetric history on file.     Family History  Problem Relation Age of Onset   Heart disease Mother    Arthritis Mother    Heart disease Father    Kidney disease Father    Heart attack Father    Arthritis Sister    Depression Sister    Diabetes Sister    Asthma Son    Hearing  loss Son    Learning disabilities Son    Arthritis Maternal Grandmother    Hearing loss Maternal Grandfather    Hearing loss Paternal Grandfather    Heart attack Paternal Grandfather    Colon cancer Neg Hx    Rectal cancer Neg Hx    Stomach cancer Neg Hx     Social History   Tobacco Use   Smoking status: Never Smoker   Smokeless tobacco: Never Used  Substance Use Topics   Alcohol use: Yes    Alcohol/week: 4.0 standard drinks    Types: 4 Glasses of wine per week   Drug use: No    Home Medications Prior to Admission medications   Medication Sig Start Date End Date Taking? Authorizing Provider  b complex vitamins tablet Take 1 tablet by mouth daily. 04/23/19   Plotnikov, Evie Lacks, MD  Cholecalciferol (VITAMIN D3) 50 MCG (2000 UT) capsule Take 1 capsule (2,000 Units total) by mouth daily. 04/23/19   Plotnikov, Evie Lacks, MD  HYDROcodone-acetaminophen (NORCO) 5-325 MG per tablet Take 0.5 tablets by mouth daily as needed. For pain    [provider]  omeprazole (PRILOSEC) 20 MG capsule  12/05/18   [provider]  RIVAROXABAN Alveda Reasons) VTE STARTER PACK (15 & 20 MG TABLETS) Follow package directions: Take one  53m tablet by mouth twice a day. On day 22, switch to one 220mtablet once a day. Take with food. 07/17/19   McAlveria ApleyPA-C  SYMBICORT 160-4.5 MCG/ACT inhaler  02/20/19   [provider]    Allergies    Other  Review of Systems   Review of Systems  Respiratory: Negative for cough and shortness of breath.   Cardiovascular: Positive for leg swelling. Negative for chest pain.  Skin: Positive for color change.  Neurological: Negative for dizziness, syncope and light-headedness.    Physical Exam Updated Vital Signs BP (!) 151/78 (BP Location: Left Arm)    Pulse 86    Temp 98.8 F (37.1 C) (Oral)    Resp 16    Ht 5' 3.5" (1.613 m)    Wt 64 kg    SpO2 96%    BMI 24.59 kg/m   Physical Exam Vitals and nursing note reviewed.    Constitutional:      General: She is not in acute distress.    Appearance: Normal appearance. She is not ill-appearing, toxic-appearing or diaphoretic.  HENT:     Head: Normocephalic.  Eyes:     Conjunctiva/sclera: Conjunctivae normal.  Pulmonary:     Effort: Pulmonary effort is normal.  Musculoskeletal:     Left lower leg: Edema present.     Comments: Palpable distal left pulses.  She has edema of the entire left leg with increased warmth.  Skin:    General: Skin is dry.     Capillary Refill: Capillary refill takes less than 2 seconds.  Neurological:     Mental Status: She is alert.  Psychiatric:        Mood and Affect: Mood normal.     ED Results / Procedures / Treatments   Labs (all labs ordered are listed, but only abnormal results are displayed) Labs Reviewed - No data to display  EKG None  Radiology VAS [place]OWER EXTREMITY VENOUS (DVT)  Result Date: 07/17/2019  Lower Venous DVTStudy Indications: Pain, and Swelling. Other Indications: Patient complains of left pain and [place]                    pain and swelling in her left leg. She denies any shortness                    of breath. Risk Factors: None identified Traveled to [place]. 3/12-3/27. Performing Technologist: GiWilkie AyeVT  Examination Guidelines: A complete evaluation includes B-mode imaging, spectral Doppler, color Doppler, and power Doppler as needed of all accessible portions of each vessel. Bilateral testing is considered an integral part of a complete examination. Limited examinations for reoccurring indications may be performed as noted. The reflux portion of the exam is performed with the patient in reverse Trendelenburg.  +-----+---------------+---------+-----------+----------+--------------+  RIGHT Compressibility Phasicity Spontaneity Properties Thrombus Aging   +-----+---------------+---------+-----------+----------+--------------+  CFV   Full            Yes       Yes                                    +-----+---------------+---------+-----------+----------+--------------+  +---------+---------------+---------+-----------+----------+--------------+  LEFT      Compressibility Phasicity Spontaneity  Properties Thrombus Aging  +---------+---------------+---------+-----------+----------+--------------+  FV Prox   None            No        No          dilated    Acute           +---------+---------------+---------+-----------+----------+--------------+  FV Mid    None            No        No          dilated    Acute           +---------+---------------+---------+-----------+----------+--------------+  FV Distal None            No        No          dilated    Acute           +---------+---------------+---------+-----------+----------+--------------+  PFV       None            No        No          dilated    Acute           +---------+---------------+---------+-----------+----------+--------------+  POP       None            No        No          dilated    Acute           +---------+---------------+---------+-----------+----------+--------------+  PTV       None            No        No          dilated    Acute           +---------+---------------+---------+-----------+----------+--------------+  PERO      None            No        No          dilated    Acute           +---------+---------------+---------+-----------+----------+--------------+  Gastroc   None                                                             +---------+---------------+---------+-----------+----------+--------------+  GSV       Full            Yes       Yes         dilated    Acute           +---------+---------------+---------+-----------+----------+--------------+  SSV       Full                                                              +---------+---------------+---------+-----------+----------+--------------+ Findings reported to [facility] at Dr. Judeen Hammans office at 3:15 pm and patient was sent to [facility] ER.  Summary: RIGHT: - No evidence of deep vein thrombosis in the lower extremity. No indirect evidence of obstruction proximal  to the inguinal ligament.  LEFT: - Findings consistent with acute deep vein thrombosis involving the left femoral vein, left proximal profunda vein, left popliteal vein, left posterior tibial veins, left peroneal veins, and left gastrocnemius veins. - No cystic structure found in the popliteal fossa.  *See table(s) above for measurements and observations.    Preliminary     Procedures Procedures (including critical care time)  Medications Ordered in ED Medications  Rivaroxaban (XARELTO) tablet 15 mg (15 mg Oral Given 07/17/19 2121)    Followed by  rivaroxaban (XARELTO) tablet 20 mg (has no administration in time range)  rivaroxaban Alveda Reasons) Education Kit for DVT/PE patients ( Does not apply Given 07/17/19 2136)    ED Course  I have reviewed the triage vital signs and the nursing notes.  Pertinent labs & imaging results that were available during my care of the patient were reviewed by me and considered in my medical decision making (see chart for details).  Clinical Course as of Jul 16 2136  Tue Jul 17, 2019  1758 Patient is 69 y/o F presenting to ER for + DVT study done on outpatient basis. DVT study showing  acute deep vein thrombosis involving the left femoral vein, left proximal profunda vein, left popliteal vein, left posterior tibial veins, left peroneal veins, and left gastrocnemius veins. Patient has no contraindications to anticoagulation, normal renal function, no sx of PE. I consulted Dr. Scot Dock from vascular who will consult on patient   [KM]  1758 CT VENOGRAM ABD/PEL [KM]  2050 I appreciate Dr. Nicole Cella consult and further instructions for the patient. Per his recommendation -   patient to be started on Xarelto and will follow up in 3 months with vascular.   [KM]  2130 Patient received education and kit from pharmacy for [place]. She declines the need for further pain medications. Will d/c in stable condition    [KM]    Clinical Course User Index [KM] Kristine Royal   MDM Rules/Calculators/A&P                      Based on review of vitals, medical screening exam, lab work and/or imaging, there does not appear to be an acute, emergent etiology for the patient's symptoms. Counseled pt on good return precautions and encouraged both PCP and ED follow-up as needed.  Prior to discharge, I also discussed incidental imaging findings with patient in detail and advised appropriate, recommended follow-up in detail.  Clinical Impression: 1. Acute deep vein thrombosis (DVT) of other specified vein of left lower extremity (HCC)     Disposition: Discharge  Prior to providing a prescription for a controlled substance, I independently reviewed the patient's recent prescription history on the [place]. The patient had no recent or regular prescriptions and was deemed appropriate for a brief, less than 3 day prescription of narcotic for acute analgesia.  This note was prepared with assistance of Systems analyst. Occasional wrong-word or sound-a-like substitutions may have occurred due to the inherent limitations of voice recognition software.  Final Clinical Impression(s) / ED Diagnoses Final diagnoses:  Acute deep vein thrombosis (DVT) of other specified vein of left lower extremity ([place])    Rx / DC Orders ED Discharge Orders         Ordered    RIVAROXABAN (XARELTO) VTE STARTER PACK (15 & 20 MG TABLETS)     07/17/19 2137  Kristine Royal 07/17/19 2138    Lajean Saver, MD 07/19/19 1354

## 2019-07-17 NOTE — Progress Notes (Unsigned)
Left lower venous has been completed and is positive for DVT in the posterior tibial, Peroneal, popliteal and femoral veins without extending into the common femoral vein.   Preliminary results can be found under CV proc through chart review.  Dondra Prader RVT [facility]

## 2019-07-17 NOTE — ED Triage Notes (Signed)
Patient sent by PCP and sent here for DVT to right leg. States she was on a plane a month ago and started having left leg swelling and pain. Sunday swelling and pain started increasing. Denies any shortness of breath or chest pain.

## 2019-07-17 NOTE — Discharge Instructions (Addendum)

## 2019-07-17 NOTE — ED Notes (Signed)
Pt verbalized understanding of discharge instructions. Follow up care and medications reviewed, pt had no further questions.

## 2019-07-17 NOTE — H&P (Signed)
ASSESSMENT & PLAN   ACUTE LEFT LOWER EXTREMITY DVT: The clot in the left lower extremity does not involve the common femoral vein.  This was documented by the technician who did the study earlier today.  Based on the amount of swelling in the leg I do not think the patient clinically has evidence of an iliofemoral DVT.  In addition the clot has likely been present for more than 2 weeks.  All things considered I think the safest approach is outpatient anticoagulation with Xarelto.  I will arrange a follow-up duplex of the left lower extremity in 3 months.  She knows to call sooner if she has problems.  In addition we have discussed the importance of intermittent leg elevation the proper positioning for this.  REASON FOR CONSULT:    Left lower extremity DVT.  The consult is requested by the emergency department.  HPI:   Leah Bird is a 69 y.o. female who was seen by her primary care physician today complaining of left leg pain and swelling.  She was sent for an outpatient venous duplex scan which showed evidence of extensive DVT of the left lower extremity.  She was told to go directly to the emergency department.  Vascular surgery is consulted for evaluation of her left lower extremity DVT.  The patient denies any prior history of DVT.  Of note she did fly across country to [place] on March 12 returning on March 27.  She began noticing some left ankle swelling around that time.  The swelling has gradually progressed.  She experiences some aching pain and heaviness in the left leg especially with standing but also somewhat with ambulation.  Elevation helps some.  The patient does not have any absolute or relative contraindications to thrombolysis.  She denies any history of intracranial hemorrhage, intracranial neoplasm, history of stroke, recent surgery, head trauma, or bleeding disorders.  She has had no history of GI bleeds.  She denies any history of pleuritic chest pain or shortness of  breath.  Past Medical History:  Diagnosis Date  . Arthritis   . Chicken pox   . Degenerative disk disease    whole spine-modic 1 spinal disease  . History of UTI   . No pertinent past medical history     Family History  Problem Relation Age of Onset  . Heart disease Mother   . Arthritis Mother   . Heart disease Father   . Kidney disease Father   . Heart attack Father   . Arthritis Sister   . Depression Sister   . Diabetes Sister   . Asthma Son   . Hearing loss Son   . Learning disabilities Son   . Arthritis Maternal Grandmother   . Hearing loss Maternal Grandfather   . Hearing loss Paternal Grandfather   . Heart attack Paternal Grandfather   . Colon cancer Neg Hx   . Rectal cancer Neg Hx   . Stomach cancer Neg Hx     SOCIAL HISTORY: She is not a smoker. Social History   Tobacco Use  . Smoking status: Never Smoker  . Smokeless tobacco: Never Used  Substance Use Topics  . Alcohol use: Yes    Alcohol/week: 4.0 standard drinks    Types: 4 Glasses of wine per week    Allergies  Allergen Reactions  . Other     Pseudocholinesterace- prolonged anesthesia. Was on life support after having    No current facility-administered medications for this encounter.   Current  Outpatient Medications  Medication Sig Dispense Refill  . b complex vitamins tablet Take 1 tablet by mouth daily. 100 tablet 3  . celecoxib (CELEBREX) 200 MG capsule Take 1 capsule (200 mg total) by mouth daily as needed for moderate pain. 90 capsule 3  . Cholecalciferol (VITAMIN D3) 50 MCG (2000 UT) capsule Take 1 capsule (2,000 Units total) by mouth daily. 100 capsule 3  . HYDROcodone-acetaminophen (NORCO) 5-325 MG per tablet Take 0.5 tablets by mouth daily as needed. For pain    . Naproxen Sodium (ALEVE PO) Take 2 tablets by mouth as needed.    Marland Kitchen omeprazole (PRILOSEC) 20 MG capsule     . SYMBICORT 160-4.5 MCG/ACT inhaler       REVIEW OF SYSTEMS:  [X]  denotes positive finding, [ ]  denotes negative  finding Cardiac  Comments:  Chest pain or chest pressure:    Shortness of breath upon exertion:    Short of breath when lying flat:    Irregular heart rhythm:        Vascular    Pain in calf, thigh, or hip brought on by ambulation:    Pain in feet at night that wakes you up from your sleep:     Blood clot in your veins:    Leg swelling:  x  left leg      Pulmonary    Oxygen at home:    Productive cough:     Wheezing:         Neurologic    Sudden weakness in arms or legs:     Sudden numbness in arms or legs:     Sudden onset of difficulty speaking or slurred speech:    Temporary loss of vision in one eye:     Problems with dizziness:         Gastrointestinal    Blood in stool:     Vomited blood:         Genitourinary    Burning when urinating:     Blood in urine:        Psychiatric    Major depression:         Hematologic    Bleeding problems:    Problems with blood clotting too easily:        Skin    Rashes or ulcers:        Constitutional    Fever or chills:    -  PHYSICAL EXAM:   Vitals:   07/17/19 1613 07/17/19 1617  BP: (!) 151/78   Pulse: 86   Resp: 16   Temp: 98.8 F (37.1 C)   TempSrc: Oral   SpO2: 96%   Weight:  64 kg  Height:  5' 3.5" (1.613 m)   Body mass index is 24.59 kg/m. GENERAL: The patient is a well-nourished female, in no acute distress. The vital signs are documented above. CARDIAC: There is a regular rate and rhythm.  VASCULAR: I do not detect carotid bruits. She has a palpable dorsalis pedis and posterior tibial pulse bilaterally. The left leg is swollen up to the level of the knee with some mild erythema on the anterior left leg.  The compartments are soft. PULMONARY: There is good air exchange bilaterally without wheezing or rales. ABDOMEN: Soft and non-tender with normal pitched bowel sounds.  I do not palpate an abdominal aortic aneurysm. MUSCULOSKELETAL: There are no major deformities. NEUROLOGIC: No focal weakness or  paresthesias are detected. SKIN: There are no ulcers or rashes noted. PSYCHIATRIC: The  patient has a normal affect.  DATA:    Lab Results  Component Value Date   WBC 11.3 (H) 04/20/2019   HGB 12.9 04/20/2019   HCT 38.6 04/20/2019   MCV 93.5 04/20/2019   PLT 384.0 04/20/2019   Lab Results  Component Value Date   NA 139 04/20/2019   K 4.4 04/20/2019   CL 101 04/20/2019   CO2 28 04/20/2019   Lab Results  Component Value Date   CREATININE 0.67 04/20/2019   Lab Results  Component Value Date   INR 0.91 04/23/2011   VENOUS DUPLEX: I reviewed the venous duplex scan that was done at the [facility] office today.  The patient is noted to have acute clot in the proximal mid and distal femoral vein, popliteal vein, posterior tibial vein, and peroneal vein.  There is also clot in the profunda femoris vein.  There is no evidence of superficial venous thrombosis in the great saphenous vein or small saphenous vein.  LABS: Her renal function is normal.  Creatinine is 0.67.  GFR is 87. White blood cell count 11.3, hemoglobin 12.9.  Hematocrit 38.6.  Platelets 384,000.  Deitra Mayo [facility] Office: [phone]

## 2019-07-18 MED FILL — XARELTO STARTER PACK: 15 & 20 | 30 days supply | Qty: 51 | Fill #0

## 2019-07-19 ENCOUNTER — Telehealth: Payer: Self-pay | Admitting: Internal Medicine

## 2019-07-19 NOTE — Telephone Encounter (Signed)
New message:   Pt is calling and states she went to the ER on Tuesday and has some blood clots. She states they put her on Xarelto and now she is experiencing headaches. She states she would like to know if she should continue this medication. Please advise.

## 2019-07-21 NOTE — Telephone Encounter (Signed)
Patient also sent a my chart message.

## 2019-07-31 ENCOUNTER — Other Ambulatory Visit: Payer: Self-pay

## 2019-07-31 ENCOUNTER — Ambulatory Visit (INDEPENDENT_AMBULATORY_CARE_PROVIDER_SITE_OTHER)
Admission: RE | Admit: 2019-07-31 | Discharge: 2019-07-31 | Disposition: A | Discharge status: Home / Self Care | Payer: PPO | Source: Ambulatory Visit | Attending: Internal Medicine | Admitting: Internal Medicine

## 2019-07-31 ENCOUNTER — Ambulatory Visit (INDEPENDENT_AMBULATORY_CARE_PROVIDER_SITE_OTHER): Payer: PPO | Admitting: Internal Medicine

## 2019-07-31 ENCOUNTER — Encounter: Payer: Self-pay | Admitting: Internal Medicine

## 2019-07-31 DIAGNOSIS — I2699 Other pulmonary embolism without acute cor pulmonale: Secondary | ICD-10-CM

## 2019-07-31 DIAGNOSIS — I82402 Acute embolism and thrombosis of unspecified deep veins of left lower extremity: Secondary | ICD-10-CM

## 2019-07-31 DIAGNOSIS — I82409 Acute embolism and thrombosis of unspecified deep veins of unspecified lower extremity: Secondary | ICD-10-CM | POA: Insufficient documentation

## 2019-07-31 DIAGNOSIS — R0602 Shortness of breath: Secondary | ICD-10-CM | POA: Diagnosis not present

## 2019-07-31 DIAGNOSIS — Z86718 Personal history of other venous thrombosis and embolism: Secondary | ICD-10-CM | POA: Insufficient documentation

## 2019-07-31 DIAGNOSIS — R42 Dizziness and giddiness: Secondary | ICD-10-CM | POA: Insufficient documentation

## 2019-07-31 DIAGNOSIS — M79605 Pain in left leg: Secondary | ICD-10-CM | POA: Diagnosis not present

## 2019-07-31 LAB — CBC WITH DIFFERENTIAL/PLATELET
Basophils Absolute: 0.1 10*3/uL (ref 0.0–0.1)
Basophils Relative: 0.7 % (ref 0.0–3.0)
Eosinophils Absolute: 0.4 10*3/uL (ref 0.0–0.7)
Eosinophils Relative: 4.4 % (ref 0.0–5.0)
HCT: 42.1 % (ref 36.0–46.0)
Hemoglobin: 14.1 g/dL (ref 12.0–15.0)
Lymphocytes Relative: 22.7 % (ref 12.0–46.0)
Lymphs Abs: 2 10*3/uL (ref 0.7–4.0)
MCHC: 33.4 g/dL (ref 30.0–36.0)
MCV: 92.8 fl (ref 78.0–100.0)
Monocytes Absolute: 0.7 10*3/uL (ref 0.1–1.0)
Monocytes Relative: 7.9 % (ref 3.0–12.0)
Neutro Abs: 5.6 10*3/uL (ref 1.4–7.7)
Neutrophils Relative %: 64.3 % (ref 43.0–77.0)
Platelets: 413 10*3/uL — ABNORMAL HIGH (ref 150.0–400.0)
RBC: 4.54 Mil/uL (ref 3.87–5.11)
RDW: 13.5 % (ref 11.5–15.5)
WBC: 8.8 10*3/uL (ref 4.0–10.5)

## 2019-07-31 LAB — BASIC METABOLIC PANEL WITH GFR
BUN: 13 mg/dL (ref 6–23)
CO2: 31 meq/L (ref 19–32)
Calcium: 9.4 mg/dL (ref 8.4–10.5)
Chloride: 99 meq/L (ref 96–112)
Creatinine, Ser: 0.74 mg/dL (ref 0.40–1.20)
GFR: 77.57 mL/min
Glucose, Bld: 90 mg/dL (ref 70–99)
Potassium: 4.1 meq/L (ref 3.5–5.1)
Sodium: 135 meq/L (ref 135–145)

## 2019-07-31 LAB — HEPATIC FUNCTION PANEL
ALT: 10 U/L (ref 0–35)
AST: 14 U/L (ref 0–37)
Albumin: 4.1 g/dL (ref 3.5–5.2)
Alkaline Phosphatase: 88 U/L (ref 39–117)
Bilirubin, Direct: 0.1 mg/dL (ref 0.0–0.3)
Total Bilirubin: 0.5 mg/dL (ref 0.2–1.2)
Total Protein: 7.6 g/dL (ref 6.0–8.3)

## 2019-07-31 LAB — D-DIMER, QUANTITATIVE: D-Dimer, Quant: 2.17 ug{FEU}/mL — ABNORMAL HIGH

## 2019-07-31 MED ORDER — IOHEXOL 350 MG/ML SOLN
80.0000 mL | Freq: Once | INTRAVENOUS | Status: AC | PRN
  Administered 2019-07-31: 80 mL via INTRAVENOUS

## 2019-07-31 NOTE — Progress Notes (Addendum)
Subjective:  Patient ID: Leah Bird, female    DOB: November 12, 1950  Age: 69 y.o. MRN: [id number]  CC: No chief complaint on file.   HPI Leah Bird presents for a large DVT - L, better now C/o lightheadedness, a little SOB, cough C/o cough and wheezing - ?asthma, cough is worse  O2 sat 98%, down to 94% after 3 laps  Outpatient Medications Prior to Visit  Medication Sig Dispense Refill  . b complex vitamins tablet Take 1 tablet by mouth daily. 100 tablet 3  . Cholecalciferol (VITAMIN D3) 50 MCG (2000 UT) capsule Take 1 capsule (2,000 Units total) by mouth daily. 100 capsule 3  . HYDROcodone-acetaminophen (NORCO) 5-325 MG per tablet Take 0.5 tablets by mouth daily as needed. For pain    . RIVAROXABAN (XARELTO) VTE STARTER PACK (15 & 20 MG TABLETS) Follow package directions: Take one 15mg  tablet by mouth twice a day. On day 22, switch to one 20mg  tablet once a day. Take with food. 51 each 0  . SYMBICORT 160-4.5 MCG/ACT inhaler     . omeprazole (PRILOSEC) 20 MG capsule      No facility-administered medications prior to visit.    ROS: Review of Systems  Constitutional: Negative for activity change, appetite change, chills, fatigue and unexpected weight change.  HENT: Negative for congestion, mouth sores and sinus pressure.   Eyes: Negative for visual disturbance.  Respiratory: Positive for cough and shortness of breath. Negative for chest tightness.   Gastrointestinal: Negative for abdominal pain and nausea.  Genitourinary: Negative for difficulty urinating, frequency and vaginal pain.  Musculoskeletal: Negative for back pain and gait problem.  Skin: Negative for pallor and rash.  Neurological: Positive for light-headedness. Negative for dizziness, tremors, weakness, numbness and headaches.  Psychiatric/Behavioral: Negative for confusion and sleep disturbance. The patient is not nervous/anxious.     Objective:  BP 120/60 (Patient Position: Standing)   Pulse 63   Temp 98 F  (36.7 C) (Oral)   Ht 5' 3.5" (1.613 m)   Wt 137 lb (62.1 kg)   SpO2 95%   BMI 23.89 kg/m   BP Readings from Last 3 Encounters:  07/31/19 120/60  07/17/19 (!) 151/78  07/17/19 (!) 146/90    Wt Readings from Last 3 Encounters:  07/31/19 137 lb (62.1 kg)  07/17/19 141 lb (64 kg)  07/17/19 141 lb (64 kg)    Physical Exam Constitutional:      General: She is not in acute distress.    Appearance: She is well-developed. She is not ill-appearing.  HENT:     Head: Normocephalic.     Right Ear: External ear normal.     Left Ear: External ear normal.     Nose: Nose normal.  Eyes:     General:        Right eye: No discharge.        Left eye: No discharge.     Conjunctiva/sclera: Conjunctivae normal.     Pupils: Pupils are equal, round, and reactive to light.  Neck:     Thyroid: No thyromegaly.     Vascular: No JVD.     Trachea: No tracheal deviation.  Cardiovascular:     Rate and Rhythm: Normal rate and regular rhythm.     Heart sounds: Normal heart sounds.  Pulmonary:     Effort: No respiratory distress.     Breath sounds: No stridor. No wheezing.  Abdominal:     General: Bowel sounds are normal. There is no  distension.     Palpations: Abdomen is soft. There is no mass.     Tenderness: There is no abdominal tenderness. There is no guarding or rebound.  Musculoskeletal:        General: No tenderness.     Cervical back: Normal range of motion and neck supple.     Right lower leg: No edema.     Left lower leg: No edema.  Lymphadenopathy:     Cervical: No cervical adenopathy.  Skin:    Findings: No erythema or rash.  Neurological:     Cranial Nerves: No cranial nerve deficit.     Motor: No abnormal muscle tone.     Coordination: Coordination normal.     Deep Tendon Reflexes: Reflexes normal.  Psychiatric:        Behavior: Behavior normal.        Thought Content: Thought content normal.        Judgment: Judgment normal.    Legs NT B, no edema Coughing a  little  Lab Results  Component Value Date   WBC 8.8 07/31/2019   HGB 14.1 07/31/2019   HCT 42.1 07/31/2019   PLT 413.0 (H) 07/31/2019   GLUCOSE 90 07/31/2019   CHOL 247 (H) 04/20/2019   TRIG 111.0 04/20/2019   HDL 59.10 04/20/2019   LDLCALC 166 (H) 04/20/2019   ALT 10 07/31/2019   AST 14 07/31/2019   NA 135 07/31/2019   K 4.1 07/31/2019   CL 99 07/31/2019   CREATININE 0.74 07/31/2019   BUN 13 07/31/2019   CO2 31 07/31/2019   TSH 1.36 04/20/2019   INR 0.91 04/23/2011   07/31/19 CT angio IMPRESSION: 1. Examination is positive for pulmonary embolus with small volume thrombus in the bifurcation of the left main pulmonary artery, extending minimally into the segmental and subsegmental branches in the left lower lobe. Thin linear filling defect in the right involves the lower lobe pulmonary artery, with possible subsegmental right middle lobe emboli. Thromboembolic burden is small. Emboli in the right are likely subacute, age indeterminate on the left. 2. Mild central bronchial thickening, can be seen with bronchitis or reactive airways disease. 3. Endplate irregularity at T7-T8, and to a lesser extent T6-T7, chronic likely sequela of prior process with abnormal marrow signal on thoracic MRI 01/29/2012.  Critical Value/emergent results were called by telephone at the time of interpretation on 07/31/2019 at 3:28 pm to Dr Jacinta Shoe , who verbally acknowledged these results.   Electronically Signed   By: Narda Rutherford M.D.   On: 07/31/2019 15:30 VAS [place] LOWER EXTREMITY VENOUS (DVT)  Result Date: 07/19/2019  Lower Venous DVTStudy Indications: Pain, and Swelling. Other Indications: Patient complains of left pain and swelling when she was in                    [place]. Over the past few days there has been increased                    pain and swelling in her left leg. She denies any shortness                    of breath. Risk Factors: None identified Traveled to  [place]. 3/12-3/27. Performing Technologist: Dondra Prader RVT  Examination Guidelines: A complete evaluation includes B-mode imaging, spectral Doppler, color Doppler, and power Doppler as needed of all accessible portions of each vessel. Bilateral testing is considered an integral part of a complete examination. Limited  examinations for reoccurring indications may be performed as noted. The reflux portion of the exam is performed with the patient in reverse Trendelenburg.  +-----+---------------+---------+-----------+----------+--------------+ RIGHTCompressibilityPhasicitySpontaneityPropertiesThrombus Aging +-----+---------------+---------+-----------+----------+--------------+ CFV  Full           Yes      Yes                                 +-----+---------------+---------+-----------+----------+--------------+  +---------+---------------+---------+-----------+----------+--------------+ LEFT     CompressibilityPhasicitySpontaneityPropertiesThrombus Aging +---------+---------------+---------+-----------+----------+--------------+ FV Prox  None           No       No         dilated   Acute          +---------+---------------+---------+-----------+----------+--------------+ FV Mid   None           No       No         dilated   Acute          +---------+---------------+---------+-----------+----------+--------------+ FV DistalNone           No       No         dilated   Acute          +---------+---------------+---------+-----------+----------+--------------+ PFV      None           No       No         dilated   Acute          +---------+---------------+---------+-----------+----------+--------------+ POP      None           No       No         dilated   Acute          +---------+---------------+---------+-----------+----------+--------------+ PTV      None           No       No         dilated   Acute           +---------+---------------+---------+-----------+----------+--------------+ PERO     None           No       No         dilated   Acute          +---------+---------------+---------+-----------+----------+--------------+ Gastroc  None                                                        +---------+---------------+---------+-----------+----------+--------------+ GSV      Full           Yes      Yes        dilated   Acute          +---------+---------------+---------+-----------+----------+--------------+ SSV      Full                                                        +---------+---------------+---------+-----------+----------+--------------+ Findings reported to [facility] at Dr. Loren Racer office at 3:15 pm and patient was sent to [facility] ER.  Summary: RIGHT: -  No evidence of deep vein thrombosis in the lower extremity. No indirect evidence of obstruction proximal to the inguinal ligament.  LEFT: - Findings consistent with acute deep vein thrombosis involving the left femoral vein, left proximal profunda vein, left popliteal vein, left posterior tibial veins, left peroneal veins, and left gastrocnemius veins. - No cystic structure found in the popliteal fossa.  *See table(s) above for measurements and observations. Electronically signed by Lorine Bears MD on 07/19/2019 at 6:28:42 AM.    Final     Assessment & Plan:     Follow-up: Return in about 1 week (around 08/07/2019) for a follow-up visit.  Sonda Primes, MD

## 2019-07-31 NOTE — Addendum Note (Signed)
Addended by: Merrilyn Puma on: 07/31/2019 12:05 PM   Modules accepted: Orders

## 2019-07-31 NOTE — Assessment & Plan Note (Addendum)
LLE - sx's started on 06/21/19 CT angio STAT Labs

## 2019-07-31 NOTE — Assessment & Plan Note (Addendum)
  Labs CT

## 2019-08-01 ENCOUNTER — Other Ambulatory Visit: Payer: Self-pay | Admitting: Internal Medicine

## 2019-08-01 DIAGNOSIS — I2699 Other pulmonary embolism without acute cor pulmonale: Secondary | ICD-10-CM | POA: Insufficient documentation

## 2019-08-01 DIAGNOSIS — Z7901 Long term (current) use of anticoagulants: Secondary | ICD-10-CM | POA: Insufficient documentation

## 2019-08-01 NOTE — Assessment & Plan Note (Addendum)
Subacute Cont w/Xarelto Hem ref  07/31/19 CT angio IMPRESSION: 1. Examination is positive for pulmonary embolus with small volume thrombus in the bifurcation of the left main pulmonary artery, extending minimally into the segmental and subsegmental branches in the left lower lobe. Thin linear filling defect in the right involves the lower lobe pulmonary artery, with possible subsegmental right middle lobe emboli. Thromboembolic burden is small. Emboli in the right are likely subacute, age indeterminate on the left. 2. Mild central bronchial thickening, can be seen with bronchitis or reactive airways disease. 3. Endplate irregularity at T7-T8, and to a lesser extent T6-T7, chronic likely sequela of prior process with abnormal marrow signal on thoracic MRI 01/29/2012.  Critical Value/emergent results were called by telephone at the time of interpretation on 07/31/2019 at 3:28 pm to Dr Jacinta Shoe , who verbally acknowledged these results.   Electronically Signed   By: Narda Rutherford M.D.   On: 07/31/2019 15:30 VAS [place] LOWER EXTREMITY VENOUS (DVT)  Consider abd CT

## 2019-08-01 NOTE — Addendum Note (Signed)
Addended by: Tresa Garter on: 08/01/2019 07:51 AM   Modules accepted: Level of Service

## 2019-08-01 NOTE — Assessment & Plan Note (Signed)
Due to DVT - resolved

## 2019-08-03 ENCOUNTER — Other Ambulatory Visit: Payer: PPO

## 2019-08-06 ENCOUNTER — Other Ambulatory Visit: Payer: Self-pay | Admitting: Internal Medicine

## 2019-08-06 ENCOUNTER — Other Ambulatory Visit: Payer: Self-pay

## 2019-08-06 ENCOUNTER — Ambulatory Visit (INDEPENDENT_AMBULATORY_CARE_PROVIDER_SITE_OTHER): Payer: PPO | Admitting: Internal Medicine

## 2019-08-06 ENCOUNTER — Encounter: Payer: Self-pay | Admitting: Internal Medicine

## 2019-08-06 VITALS — BP 130/84 | HR 73 | Temp 98.5°F | Ht 63.5 in | Wt 139.0 lb

## 2019-08-06 DIAGNOSIS — I2699 Other pulmonary embolism without acute cor pulmonale: Secondary | ICD-10-CM | POA: Diagnosis not present

## 2019-08-06 DIAGNOSIS — R0600 Dyspnea, unspecified: Secondary | ICD-10-CM | POA: Diagnosis not present

## 2019-08-06 DIAGNOSIS — R059 Cough, unspecified: Secondary | ICD-10-CM | POA: Insufficient documentation

## 2019-08-06 DIAGNOSIS — I82402 Acute embolism and thrombosis of unspecified deep veins of left lower extremity: Secondary | ICD-10-CM

## 2019-08-06 DIAGNOSIS — R05 Cough: Secondary | ICD-10-CM | POA: Diagnosis not present

## 2019-08-06 DIAGNOSIS — I2694 Multiple subsegmental pulmonary emboli without acute cor pulmonale: Secondary | ICD-10-CM

## 2019-08-06 DIAGNOSIS — R0602 Shortness of breath: Secondary | ICD-10-CM | POA: Diagnosis not present

## 2019-08-06 DIAGNOSIS — R0609 Other forms of dyspnea: Secondary | ICD-10-CM

## 2019-08-06 MED ORDER — BENZONATATE 200 MG PO CAPS: 200.0000 mg | ORAL_CAPSULE | Freq: Three times a day (TID) | ORAL | 1 refills | Status: DC | PRN

## 2019-08-06 MED ORDER — RIVAROXABAN 20 MG PO TABS: 20.0000 mg | ORAL_TABLET | Freq: Every day | ORAL | 5 refills | Status: DC

## 2019-08-06 MED ORDER — SYMBICORT 160-4.5 MCG/ACT IN AERO: 2.0000 | INHALATION_SPRAY | Freq: Two times a day (BID) | RESPIRATORY_TRACT | 11 refills | Status: DC

## 2019-08-06 MED FILL — BENZONATATE 200 MG CAPS: 200 | 20 days supply | Qty: 60 | Fill #0

## 2019-08-06 MED FILL — XARELTO 20 MG TABLET: 20 | 30 days supply | Qty: 30 | Fill #0

## 2019-08-06 NOTE — Assessment & Plan Note (Signed)
Hem ref - Dr Myna Hidalgo Pt is planning to see her GYN Dr Jennette Kettle and have mammogram  - end of May 2021 Last colonoscopy - 2014 We discussed a potential need for abd CT, cologuard test. She will see dr Myna Hidalgo first.

## 2019-08-06 NOTE — Progress Notes (Signed)
Hemat ref

## 2019-08-06 NOTE — Assessment & Plan Note (Signed)
Due to PE Check 2D ECHO

## 2019-08-06 NOTE — Assessment & Plan Note (Signed)
Pt is planning to see her GYN Dr Jennette Kettle and have mammogram  - end of May 2021 Last colonoscopy - 2014

## 2019-08-06 NOTE — Progress Notes (Signed)
Subjective:  Patient ID: Leah Bird, female    DOB: 10/28/1950  Age: 69 y.o. MRN: [id number]  CC: No chief complaint on file.   HPI Leah Bird presents for LLE  DVT/PE C/o mild DOE. C/o lightheadedness at times... No syncope.No HA, no CP. No recent leg swelling. Pt is planning to see her GYN Dr Jennette Kettle and have mammogram  - end of May 2021 Last colonoscopy - 2014 F/u chronic cough - on symbicrt - not better  Outpatient Medications Prior to Visit  Medication Sig Dispense Refill  . b complex vitamins tablet Take 1 tablet by mouth daily. 100 tablet 3  . Cholecalciferol (VITAMIN D3) 50 MCG (2000 UT) capsule Take 1 capsule (2,000 Units total) by mouth daily. 100 capsule 3  . HYDROcodone-acetaminophen (NORCO) 5-325 MG per tablet Take 0.5 tablets by mouth daily as needed. For pain    . RIVAROXABAN (XARELTO) VTE STARTER PACK (15 & 20 MG TABLETS) Follow package directions: Take one 15mg  tablet by mouth twice a day. On day 22, switch to one 20mg  tablet once a day. Take with food. 51 each 0  . SYMBICORT 160-4.5 MCG/ACT inhaler      No facility-administered medications prior to visit.    ROS: Review of Systems  Constitutional: Negative for activity change, appetite change, chills, fatigue and unexpected weight change.  HENT: Negative for congestion, mouth sores and sinus pressure.   Eyes: Negative for visual disturbance.  Respiratory: Positive for cough and shortness of breath. Negative for chest tightness, wheezing and stridor.   Cardiovascular: Positive for palpitations. Negative for chest pain and leg swelling.  Gastrointestinal: Negative for abdominal pain and nausea.  Genitourinary: Negative for difficulty urinating, frequency and vaginal pain.  Musculoskeletal: Negative for back pain and gait problem.  Skin: Negative for pallor and rash.  Neurological: Positive for light-headedness. Negative for dizziness, tremors, weakness, numbness and headaches.  Psychiatric/Behavioral:  Negative for confusion and sleep disturbance.    Objective:  BP 130/84 (BP Location: Left Arm, Patient Position: Sitting, Cuff Size: Normal)   Pulse 73   Temp 98.5 F (36.9 C) (Oral)   Ht 5' 3.5" (1.613 m)   Wt 139 lb (63 kg)   SpO2 98%   BMI 24.24 kg/m   BP Readings from Last 3 Encounters:  08/06/19 130/84  07/31/19 120/60  07/17/19 (!) 151/78    Wt Readings from Last 3 Encounters:  08/06/19 139 lb (63 kg)  07/31/19 137 lb (62.1 kg)  07/17/19 141 lb (64 kg)    Physical Exam Constitutional:      General: She is not in acute distress.    Appearance: She is well-developed.  HENT:     Head: Normocephalic.     Right Ear: External ear normal.     Left Ear: External ear normal.     Nose: Nose normal.  Eyes:     General:        Right eye: No discharge.        Left eye: No discharge.     Conjunctiva/sclera: Conjunctivae normal.     Pupils: Pupils are equal, round, and reactive to light.  Neck:     Thyroid: No thyromegaly.     Vascular: No JVD.     Trachea: No tracheal deviation.  Cardiovascular:     Rate and Rhythm: Normal rate and regular rhythm.     Heart sounds: Normal heart sounds.  Pulmonary:     Effort: No respiratory distress.     Breath  sounds: No stridor. No wheezing.  Abdominal:     General: Bowel sounds are normal. There is no distension.     Palpations: Abdomen is soft. There is no mass.     Tenderness: There is no abdominal tenderness. There is no guarding or rebound.  Musculoskeletal:        General: No tenderness.     Cervical back: Normal range of motion and neck supple.     Right lower leg: No edema.     Left lower leg: No edema.  Lymphadenopathy:     Cervical: No cervical adenopathy.  Skin:    Findings: No erythema or rash.  Neurological:     Cranial Nerves: No cranial nerve deficit.     Motor: No abnormal muscle tone.     Coordination: Coordination normal.     Deep Tendon Reflexes: Reflexes normal.  Psychiatric:        Behavior:  Behavior normal.        Thought Content: Thought content normal.        Judgment: Judgment normal.     Lab Results  Component Value Date   WBC 8.8 07/31/2019   HGB 14.1 07/31/2019   HCT 42.1 07/31/2019   PLT 413.0 (H) 07/31/2019   GLUCOSE 90 07/31/2019   CHOL 247 (H) 04/20/2019   TRIG 111.0 04/20/2019   HDL 59.10 04/20/2019   LDLCALC 166 (H) 04/20/2019   ALT 10 07/31/2019   AST 14 07/31/2019   NA 135 07/31/2019   K 4.1 07/31/2019   CL 99 07/31/2019   CREATININE 0.74 07/31/2019   BUN 13 07/31/2019   CO2 31 07/31/2019   TSH 1.36 04/20/2019   INR 0.91 04/23/2011    CT ANGIO CHEST PE W OR WO CONTRAST  Result Date: 07/31/2019 CLINICAL DATA:  Shortness of breath. Lightheaded. Recently diagnosed with left leg DVT. EXAM: CT ANGIOGRAPHY CHEST WITH CONTRAST TECHNIQUE: Multidetector CT imaging of the chest was performed using the standard protocol during bolus administration of intravenous contrast. Multiplanar CT image reconstructions and MIPs were obtained to evaluate the vascular anatomy. CONTRAST:  66mL OMNIPAQUE IOHEXOL 350 MG/ML SOLN COMPARISON:  Most recent radiograph 03/21/2019. FINDINGS: Cardiovascular: Examination is positive for pulmonary embolus with small volume thrombus in the bifurcation of the left main pulmonary artery. Thrombus extends minimally into the segmental and subsegmental branches in the left lower lobe. Thin linear filling defect in the right lower lobe pulmonary artery, and possible subsegmental right middle lobe filling defect. Thromboembolic burden is small. Thrombus appears nonocclusive. There is no evidence of right heart strain. No contrast refluxing into the hepatic veins are IVC. Heart is normal in size. Thoracic aorta is normal in caliber without dissection. No pericardial effusion. Mediastinum/Nodes: No enlarged mediastinal or hilar lymph nodes. No esophageal wall thickening. Visualized portion of the thyroid gland is unremarkable. Lungs/Pleura: No acute  airspace disease or pulmonary infarct. No pulmonary edema or pleural fluid. The trachea and mainstem bronchi are patent. No pulmonary mass. Mild central bronchial thickening. Upper Abdomen: No acute abnormality. Musculoskeletal: Endplate irregularity at T7-T8, and to a lesser extent T6-T7, likely sequela of prior process with abnormal marrow signal on thoracic MRI 01/29/2012. no acute osseous abnormalities. Review of the MIP images confirms the above findings. IMPRESSION: 1. Examination is positive for pulmonary embolus with small volume thrombus in the bifurcation of the left main pulmonary artery, extending minimally into the segmental and subsegmental branches in the left lower lobe. Thin linear filling defect in the right involves the lower  lobe pulmonary artery, with possible subsegmental right middle lobe emboli. Thromboembolic burden is small. Emboli in the right are likely subacute, age indeterminate on the left. 2. Mild central bronchial thickening, can be seen with bronchitis or reactive airways disease. 3. Endplate irregularity at T7-T8, and to a lesser extent T6-T7, chronic likely sequela of prior process with abnormal marrow signal on thoracic MRI 01/29/2012. Critical Value/emergent results were called by telephone at the time of interpretation on 07/31/2019 at 3:28 pm to Dr Lew Dawes , who verbally acknowledged these results. Electronically Signed   By: Keith Rake M.D.   On: 07/31/2019 15:30    Assessment & Plan:   There are no diagnoses linked to this encounter.   Meds ordered this encounter  Medications  . SYMBICORT 160-4.5 MCG/ACT inhaler    Sig: Inhale 2 puffs into the lungs in the morning and at bedtime.    Dispense:  1 Inhaler    Refill:  11  . rivaroxaban (XARELTO) 20 MG TABS tablet    Sig: Take 1 tablet (20 mg total) by mouth daily.    Dispense:  30 tablet    Refill:  5     Follow-up: No follow-ups on file.  Walker Kehr, MD

## 2019-08-06 NOTE — Assessment & Plan Note (Signed)
Tessalon prn 

## 2019-08-09 ENCOUNTER — Other Ambulatory Visit: Payer: Self-pay

## 2019-08-09 ENCOUNTER — Ambulatory Visit (HOSPITAL_COMMUNITY): Payer: PPO | Attending: Cardiovascular Disease

## 2019-08-09 DIAGNOSIS — R0600 Dyspnea, unspecified: Secondary | ICD-10-CM | POA: Insufficient documentation

## 2019-08-09 DIAGNOSIS — R0609 Other forms of dyspnea: Secondary | ICD-10-CM

## 2019-08-23 DIAGNOSIS — Z1231 Encounter for screening mammogram for malignant neoplasm of breast: Secondary | ICD-10-CM | POA: Diagnosis not present

## 2019-08-23 DIAGNOSIS — Z01419 Encounter for gynecological examination (general) (routine) without abnormal findings: Secondary | ICD-10-CM | POA: Diagnosis not present

## 2019-08-23 DIAGNOSIS — M8588 Other specified disorders of bone density and structure, other site: Secondary | ICD-10-CM | POA: Diagnosis not present

## 2019-08-23 DIAGNOSIS — Z6824 Body mass index (BMI) 24.0-24.9, adult: Secondary | ICD-10-CM | POA: Diagnosis not present

## 2019-08-23 DIAGNOSIS — N958 Other specified menopausal and perimenopausal disorders: Secondary | ICD-10-CM | POA: Diagnosis not present

## 2019-08-28 ENCOUNTER — Other Ambulatory Visit: Payer: Self-pay | Admitting: Hematology & Oncology

## 2019-08-28 ENCOUNTER — Other Ambulatory Visit: Payer: Self-pay

## 2019-08-28 ENCOUNTER — Inpatient Hospital Stay: Payer: PPO

## 2019-08-28 ENCOUNTER — Inpatient Hospital Stay: Payer: PPO | Attending: Hematology & Oncology | Admitting: Hematology & Oncology

## 2019-08-28 ENCOUNTER — Encounter: Payer: Self-pay | Admitting: Hematology & Oncology

## 2019-08-28 VITALS — BP 132/85 | HR 78 | Temp 97.5°F | Resp 17 | Ht 63.0 in | Wt 139.0 lb

## 2019-08-28 DIAGNOSIS — Z8249 Family history of ischemic heart disease and other diseases of the circulatory system: Secondary | ICD-10-CM | POA: Insufficient documentation

## 2019-08-28 DIAGNOSIS — Z7951 Long term (current) use of inhaled steroids: Secondary | ICD-10-CM | POA: Insufficient documentation

## 2019-08-28 DIAGNOSIS — I82402 Acute embolism and thrombosis of unspecified deep veins of left lower extremity: Secondary | ICD-10-CM | POA: Insufficient documentation

## 2019-08-28 DIAGNOSIS — Z833 Family history of diabetes mellitus: Secondary | ICD-10-CM | POA: Diagnosis not present

## 2019-08-28 DIAGNOSIS — I2699 Other pulmonary embolism without acute cor pulmonale: Secondary | ICD-10-CM | POA: Diagnosis not present

## 2019-08-28 DIAGNOSIS — Z7901 Long term (current) use of anticoagulants: Secondary | ICD-10-CM | POA: Insufficient documentation

## 2019-08-28 DIAGNOSIS — Z8261 Family history of arthritis: Secondary | ICD-10-CM | POA: Insufficient documentation

## 2019-08-28 LAB — CBC WITH DIFFERENTIAL (CANCER CENTER ONLY)
Abs Immature Granulocytes: 0.03 10*3/uL (ref 0.00–0.07)
Basophils Absolute: 0.1 10*3/uL (ref 0.0–0.1)
Basophils Relative: 1 %
Eosinophils Absolute: 0.3 10*3/uL (ref 0.0–0.5)
Eosinophils Relative: 3 %
HCT: 41 % (ref 36.0–46.0)
Hemoglobin: 13.4 g/dL (ref 12.0–15.0)
Immature Granulocytes: 0 %
Lymphocytes Relative: 23 %
Lymphs Abs: 2.4 10*3/uL (ref 0.7–4.0)
MCH: 30.7 pg (ref 26.0–34.0)
MCHC: 32.7 g/dL (ref 30.0–36.0)
MCV: 93.8 fL (ref 80.0–100.0)
Monocytes Absolute: 0.7 10*3/uL (ref 0.1–1.0)
Monocytes Relative: 7 %
Neutro Abs: 6.6 10*3/uL (ref 1.7–7.7)
Neutrophils Relative %: 66 %
Platelet Count: 352 10*3/uL (ref 150–400)
RBC: 4.37 MIL/uL (ref 3.87–5.11)
RDW: 13.2 % (ref 11.5–15.5)
WBC Count: 10.1 10*3/uL (ref 4.0–10.5)
nRBC: 0 % (ref 0.0–0.2)

## 2019-08-28 LAB — CMP (CANCER CENTER ONLY)
ALT: 11 U/L (ref 0–44)
AST: 15 U/L (ref 15–41)
Albumin: 4.4 g/dL (ref 3.5–5.0)
Alkaline Phosphatase: 69 U/L (ref 38–126)
Anion gap: 8 (ref 5–15)
BUN: 14 mg/dL (ref 8–23)
CO2: 31 mmol/L (ref 22–32)
Calcium: 9.7 mg/dL (ref 8.9–10.3)
Chloride: 100 mmol/L (ref 98–111)
Creatinine: 0.78 mg/dL (ref 0.44–1.00)
GFR, Est AFR Am: >60 mL/min (ref >60)
GFR, Estimated: >60 mL/min (ref >60)
Glucose, Bld: 101 mg/dL — ABNORMAL HIGH (ref 70–99)
Potassium: 4 mmol/L (ref 3.5–5.1)
Sodium: 139 mmol/L (ref 135–145)
Total Bilirubin: 0.5 mg/dL (ref 0.3–1.2)
Total Protein: 7.4 g/dL (ref 6.5–8.1)

## 2019-08-28 MED ORDER — FOLIC ACID 1 MG PO TABS: 2.0000 mg | ORAL_TABLET | Freq: Every day | ORAL | 12 refills | Status: DC

## 2019-08-28 NOTE — Progress Notes (Signed)
Referral MD  Reason for Referral: Left lower extremity DVT/bilateral pulmonary emboli-heterozygote MTHFR  Chief Complaint  Patient presents with  . Follow-up  : I had a blood clot in my leg and lungs.  HPI: Leah Bird is a very nice 69 year old Caucasian female.  She is actually the wife of the esteemed dermatologist Leah Bird.  She is a Customer service manager.  She works part-time right now.  She actually has been going out to [place].  Her youngest daughter is going to have a baby in August.  She was out there in March.  When she got back up, she noted that she has some swelling and a little bit of discomfort in the left leg.  This seemed to worsen.  Ultimately, she had a hard time walking.  She was seen by her family doctor, Dr. Posey Rea.  He went ahead and got a ultrasound of her legs.  Surprisingly, this showed an extensive thrombus in the left leg.  This extended from the gastrocnemius vein and peroneal vein up to the femoral vein.  She then had a CT angiogram of the chest.  This did show some small bilateral pulmonary emboli.  She was placed on Xarelto.  The swelling improved.  She really never had any chest pain.  She had no shortness of breath.  There is no cough.  Of note, there is a heterozygous MTHFR abnormality in the family.  She has had 1 miscarriage.  There is no family history of blood clots as far she knows.  She really has not had any past surgeries.  She is never has been on estrogens.  She does not smoke.  1 issue is that she is planning on going out to [place] to help with her daughter.  She is planning to go out on June 18 for 10 days.  Then, in August, she will be out there for a month.  She does exercise.  She looks like she is in great shape.  She definitely does not look her age.  There is been no weight loss or weight gain.  She is up-to-date with her mammogram and colonoscopies.  I do not think there is any family history of  malignancy.  Overall, I would say her performance status is ECOG 0.    Past Medical History:  Diagnosis Date  . Arthritis   . Chicken pox   . Degenerative disk disease    whole spine-modic 1 spinal disease  . History of UTI   . No pertinent past medical history   :  Past Surgical History:  Procedure Laterality Date  . DILATION AND CURETTAGE OF UTERUS  1990   Reacted to anesthesia/was on life support  . EYE SURGERY    . TUBAL LIGATION    . WISDOM TOOTH EXTRACTION     under sedation  :   Current Outpatient Medications:  .  b complex vitamins tablet, Take 1 tablet by mouth daily., Disp: 100 tablet, Rfl: 3 .  benzonatate (TESSALON) 200 MG capsule, Take 1 capsule (200 mg total) by mouth 3 (three) times daily as needed for cough., Disp: 60 capsule, Rfl: 1 .  Cholecalciferol (VITAMIN D3) 50 MCG (2000 UT) capsule, Take 1 capsule (2,000 Units total) by mouth daily., Disp: 100 capsule, Rfl: 3 .  folic acid (FOLVITE) 1 MG tablet, Take 2 tablets (2 mg total) by mouth daily., Disp: 60 tablet, Rfl: 12 .  rivaroxaban (XARELTO) 20 MG TABS tablet, Take 1 tablet (20 mg total) by  mouth daily., Disp: 30 tablet, Rfl: 5 .  SYMBICORT 160-4.5 MCG/ACT inhaler, Inhale 2 puffs into the lungs in the morning and at bedtime., Disp: 1 Inhaler, Rfl: 11:  :  Allergies  Allergen Reactions  . Other     Pseudocholinesterace- prolonged anesthesia. Was on life support after having  :  Family History  Problem Relation Age of Onset  . Heart disease Mother   . Arthritis Mother   . Heart disease Father   . Kidney disease Father   . Heart attack Father   . Arthritis Sister   . Depression Sister   . Diabetes Sister   . Asthma Son   . Hearing loss Son   . Learning disabilities Son   . Arthritis Maternal Grandmother   . Hearing loss Maternal Grandfather   . Hearing loss Paternal Grandfather   . Heart attack Paternal Grandfather   . Colon cancer Neg Hx   . Rectal cancer Neg Hx   . Stomach cancer Neg  Hx   :  Social History   Socioeconomic History  . Marital status: Married    Spouse name: Not on file  . Number of children: Not on file  . Years of education: Not on file  . Highest education level: Not on file  Occupational History  . Not on file  Tobacco Use  . Smoking status: Never Smoker  . Smokeless tobacco: Never Used  Substance and Sexual Activity  . Alcohol use: Yes    Alcohol/week: 4.0 standard drinks    Types: 4 Glasses of wine per week  . Drug use: No  . Sexual activity: Not on file  Other Topics Concern  . Not on file  Social History Narrative  . Not on file   Social Determinants of Health   Financial Resource Strain:   . Difficulty of Paying Living Expenses:   Food Insecurity:   . Worried About Charity fundraiser in the Last Year:   . Arboriculturist in the Last Year:   Transportation Needs:   . Film/video editor (Medical):   Marland Kitchen Lack of Transportation (Non-Medical):   Physical Activity:   . Days of Exercise per Week:   . Minutes of Exercise per Session:   Stress:   . Feeling of Stress :   Social Connections:   . Frequency of Communication with Friends and Family:   . Frequency of Social Gatherings with Friends and Family:   . Attends Religious Services:   . Active Member of Clubs or Organizations:   . Attends Archivist Meetings:   Marland Kitchen Marital Status:   Intimate Partner Violence:   . Fear of Current or Ex-Partner:   . Emotionally Abused:   Marland Kitchen Physically Abused:   . Sexually Abused:   :  Review of Systems  Constitutional: Negative.   HENT: Negative.   Eyes: Negative.   Respiratory: Negative.   Cardiovascular: Positive for leg swelling.  Gastrointestinal: Negative.   Genitourinary: Negative.   Musculoskeletal: Negative.   Skin: Negative.   Neurological: Negative.   Endo/Heme/Allergies: Negative.   Psychiatric/Behavioral: Negative.      Exam:  This is a well-developed and well-nourished white female in no obvious  distress.  Head and neck exam shows no scleral icterus.  There is no ocular or oral lesions.  There is no adenopathy in the neck.  She has no palpable thyroid.  Lungs are clear bilaterally.  There is no friction rub.  Cardiac exam regular rate and  rhythm with no murmurs, rubs or bruits.  Abdomen is soft.  She has good bowel sounds.  There is no fluid wave.  There is no palpable liver or spleen tip.  Back exam shows no tenderness over the spine, ribs or hips.  Extremities shows some mild nonpitting edema of the left lower leg.  She has some prominent veins in the left lower leg.  She has good range of motion of her joints.  There is no obvious venous cord in the left leg.  She has a negative Homans' sign bilaterally.  Skin exam shows no rashes, ecchymoses or petechia.  Neurological exam is nonfocal.  @IPVITALS @   Recent Labs    08/28/19 1340  WBC 10.1  HGB 13.4  HCT 41.0  PLT 352   Recent Labs    08/28/19 1340  NA 139  K 4.0  CL 100  CO2 31  GLUCOSE 101*  BUN 14  CREATININE 0.78  CALCIUM 9.7    Blood smear review: None  Pathology: None    Assessment and Plan: Ms. Khiev is a very charming 69 year old white female.  She has been in great health.  She now has a thrombus and pulmonary embolus.  The thrombus is in the left leg.  This had to be stimulated by her travels across the country.  She said that she did get dehydrated on the airplane.  I would not think that she has any obvious hypercoagulable issues.  I know that she is heterozygous for the MTHFR gene.  This is debatable as to whether or not this causes thromboembolic disease.  I really do not think we have to embark upon a major work-up on her at the present time.  She is on Xarelto.  She will clearly need Xarelto for 1 year of therapeutic in 1 year of maintenance.  I know it is important for her to go out to [place] to help her daughter.  I want to make sure that things will be safe for her when she goes out  there.  She will need a compression stockings for her legs.  I would prefer thigh-high stockings.  I gave her a prescription in the address for [employer] down in [place] which I think does a very good job with compression hose.  I told her that she should also take a baby aspirin when she travels.  I told her to start the baby aspirin the day before she goes to [place] and to take it while she is out in [place] and to stop it the day after she comes back from [place].  I would like to get Doppler of her legs and a CT angiogram of her chest before she goes out to [place].  We will set these up for 2 weeks.  I spent about an hour with her.  She also I think would benefit from folic acid given that she has the MTHFR abnormality.  I think 2 mg of folic acid would be reasonable.  I do not see any reason why she cannot take the folic acid.  I would like to get her back to see me before she goes out to [place] in June.

## 2019-08-29 LAB — HOMOCYSTEINE: Homocysteine: 15.2 umol/L (ref 0.0–17.2)

## 2019-08-30 ENCOUNTER — Encounter: Payer: Self-pay | Admitting: Hematology & Oncology

## 2019-08-30 LAB — LUPUS ANTICOAGULANT PANEL
DRVVT: 85.2 s — ABNORMAL HIGH (ref 0.0–47.0)
PTT Lupus Anticoagulant: 42.1 s (ref 0.0–51.9)

## 2019-08-30 LAB — DRVVT CONFIRM: dRVVT Confirm: 1.6 ratio — ABNORMAL HIGH (ref 0.8–1.2)

## 2019-08-30 LAB — DRVVT MIX: dRVVT Mix: 69.5 s — ABNORMAL HIGH (ref 0.0–40.4)

## 2019-08-31 MED FILL — FOLIC ACID 1 MG TABS: 1 | 30 days supply | Qty: 60 | Fill #0

## 2019-09-05 ENCOUNTER — Telehealth: Payer: Self-pay | Admitting: Hematology & Oncology

## 2019-09-05 NOTE — Telephone Encounter (Signed)
Appointments scheduled calendar printed & mailed per 5/25 los °

## 2019-09-07 ENCOUNTER — Encounter: Payer: Self-pay | Admitting: Hematology & Oncology

## 2019-09-07 MED ORDER — RIVAROXABAN 20 MG PO TABS: 20.0000 mg | ORAL_TABLET | Freq: Every day | ORAL | 1 refills | Status: DC

## 2019-09-07 MED FILL — XARELTO 20 MG TABLET: 20 | 90 days supply | Qty: 90 | Fill #0

## 2019-09-07 NOTE — Addendum Note (Signed)
Addended by: Deatra James on: 09/07/2019 03:30 PM   Modules accepted: Orders

## 2019-09-11 ENCOUNTER — Ambulatory Visit (HOSPITAL_COMMUNITY)
Admission: RE | Admit: 2019-09-11 | Discharge: 2019-09-11 | Disposition: A | Discharge status: Home / Self Care | Payer: PPO | Source: Ambulatory Visit | Attending: Hematology & Oncology | Admitting: Hematology & Oncology

## 2019-09-11 ENCOUNTER — Telehealth: Payer: Self-pay | Admitting: *Deleted

## 2019-09-11 ENCOUNTER — Other Ambulatory Visit: Payer: Self-pay | Admitting: *Deleted

## 2019-09-11 ENCOUNTER — Other Ambulatory Visit: Payer: Self-pay

## 2019-09-11 DIAGNOSIS — I82402 Acute embolism and thrombosis of unspecified deep veins of left lower extremity: Secondary | ICD-10-CM | POA: Diagnosis not present

## 2019-09-11 DIAGNOSIS — I2699 Other pulmonary embolism without acute cor pulmonale: Secondary | ICD-10-CM | POA: Diagnosis not present

## 2019-09-11 NOTE — Telephone Encounter (Signed)
Received call from Vascular lab with the following report,"patient is positive for a DVT in the popliteal vein and distal femoral vein." Will give this report to Dr. Myna Hidalgo.

## 2019-09-14 ENCOUNTER — Ambulatory Visit (HOSPITAL_COMMUNITY)
Admission: RE | Admit: 2019-09-14 | Discharge: 2019-09-14 | Disposition: A | Discharge status: Home / Self Care | Payer: PPO | Source: Ambulatory Visit | Attending: Hematology & Oncology | Admitting: Hematology & Oncology

## 2019-09-14 ENCOUNTER — Other Ambulatory Visit: Payer: Self-pay

## 2019-09-14 DIAGNOSIS — I82402 Acute embolism and thrombosis of unspecified deep veins of left lower extremity: Secondary | ICD-10-CM

## 2019-09-14 DIAGNOSIS — I2699 Other pulmonary embolism without acute cor pulmonale: Secondary | ICD-10-CM | POA: Insufficient documentation

## 2019-09-14 MED ORDER — IOHEXOL 350 MG/ML SOLN
100.0000 mL | Freq: Once | INTRAVENOUS | Status: AC | PRN
  Administered 2019-09-14: 100 mL via INTRAVENOUS

## 2019-09-14 MED ORDER — SODIUM CHLORIDE (PF) 0.9 % IJ SOLN
INTRAMUSCULAR | Status: AC
  Filled 2019-09-14: qty 50

## 2019-09-17 ENCOUNTER — Inpatient Hospital Stay: Payer: PPO

## 2019-09-17 ENCOUNTER — Other Ambulatory Visit: Payer: Self-pay

## 2019-09-17 ENCOUNTER — Telehealth: Payer: Self-pay | Admitting: Hematology & Oncology

## 2019-09-17 ENCOUNTER — Encounter: Payer: Self-pay | Admitting: Hematology & Oncology

## 2019-09-17 ENCOUNTER — Inpatient Hospital Stay (HOSPITAL_BASED_OUTPATIENT_CLINIC_OR_DEPARTMENT_OTHER): Payer: PPO | Admitting: Hematology & Oncology

## 2019-09-17 ENCOUNTER — Inpatient Hospital Stay: Payer: PPO | Attending: Hematology & Oncology

## 2019-09-17 VITALS — BP 143/78 | HR 68 | Temp 97.6°F | Resp 16 | Wt 138.0 lb

## 2019-09-17 DIAGNOSIS — Z7901 Long term (current) use of anticoagulants: Secondary | ICD-10-CM | POA: Insufficient documentation

## 2019-09-17 DIAGNOSIS — D6862 Lupus anticoagulant syndrome: Secondary | ICD-10-CM | POA: Insufficient documentation

## 2019-09-17 DIAGNOSIS — I2699 Other pulmonary embolism without acute cor pulmonale: Secondary | ICD-10-CM | POA: Diagnosis not present

## 2019-09-17 DIAGNOSIS — I82402 Acute embolism and thrombosis of unspecified deep veins of left lower extremity: Secondary | ICD-10-CM | POA: Insufficient documentation

## 2019-09-17 LAB — CMP (CANCER CENTER ONLY)
ALT: 14 U/L (ref 0–44)
AST: 15 U/L (ref 15–41)
Albumin: 4.2 g/dL (ref 3.5–5.0)
Alkaline Phosphatase: 62 U/L (ref 38–126)
Anion gap: 7 (ref 5–15)
BUN: 13 mg/dL (ref 8–23)
CO2: 30 mmol/L (ref 22–32)
Calcium: 9.4 mg/dL (ref 8.9–10.3)
Chloride: 103 mmol/L (ref 98–111)
Creatinine: 0.75 mg/dL (ref 0.44–1.00)
GFR, Est AFR Am: >60 mL/min (ref >60)
GFR, Estimated: >60 mL/min (ref >60)
Glucose, Bld: 98 mg/dL (ref 70–99)
Potassium: 3.9 mmol/L (ref 3.5–5.1)
Sodium: 140 mmol/L (ref 135–145)
Total Bilirubin: 0.3 mg/dL (ref 0.3–1.2)
Total Protein: 7 g/dL (ref 6.5–8.1)

## 2019-09-17 LAB — CBC WITH DIFFERENTIAL (CANCER CENTER ONLY)
Abs Immature Granulocytes: 0.02 10*3/uL (ref 0.00–0.07)
Basophils Absolute: 0.1 10*3/uL (ref 0.0–0.1)
Basophils Relative: 1 %
Eosinophils Absolute: 0.3 10*3/uL (ref 0.0–0.5)
Eosinophils Relative: 4 %
HCT: 40.7 % (ref 36.0–46.0)
Hemoglobin: 13.3 g/dL (ref 12.0–15.0)
Immature Granulocytes: 0 %
Lymphocytes Relative: 28 %
Lymphs Abs: 1.8 10*3/uL (ref 0.7–4.0)
MCH: 30.5 pg (ref 26.0–34.0)
MCHC: 32.7 g/dL (ref 30.0–36.0)
MCV: 93.3 fL (ref 80.0–100.0)
Monocytes Absolute: 0.5 10*3/uL (ref 0.1–1.0)
Monocytes Relative: 8 %
Neutro Abs: 3.7 10*3/uL (ref 1.7–7.7)
Neutrophils Relative %: 59 %
Platelet Count: 302 10*3/uL (ref 150–400)
RBC: 4.36 MIL/uL (ref 3.87–5.11)
RDW: 13.2 % (ref 11.5–15.5)
WBC Count: 6.3 10*3/uL (ref 4.0–10.5)
nRBC: 0 % (ref 0.0–0.2)

## 2019-09-17 LAB — D-DIMER, QUANTITATIVE: D-Dimer, Quant: 0.27 ug/mL-FEU (ref 0.00–0.50)

## 2019-09-17 NOTE — Telephone Encounter (Signed)
Appointments scheduled calendar printed per 6/14 los °

## 2019-09-17 NOTE — Progress Notes (Signed)
Hematology and Oncology Follow Up Visit  Leah Bird [id number] 1951/03/17 69 y.o. 09/17/2019   Principle Diagnosis:   Left pulmonary embolism  Left leg thromboembolic disease femoral vein down to peroneal vein  Lupus anticoagulant positive - ?  Transient  MTHFR -heterozygous  Current Therapy:    Xarelto 20 mg p.o. daily -complete 1 year in April 6761  Folic acid 2 mg p.o. daily     Interim History:  Leah Bird is back for follow-up.  This is her second visit to the office.  First saw her back in May.  At that time, she had a blood clot in the left lung and also in the left leg.  We held her on Xarelto.  She also was started on folic acid at 2 mg a day because of the MTHFR mutation.  We did hypercoagulable studies on her.  She was found to have a lupus anticoagulant.  Again I am unsure if this is true or not.  We will have to repeat this when we see her back.  She feels well.  She is doing well on the Xarelto.  She does have the compression stockings on her legs.  I did tell her to take a baby aspirin starting this Thursday as she is going out to [place] on Friday for 2 weeks to help her daughter with her pregnancy.  A baby boy will be expected in August.  There is no cough or shortness of breath.  She has had no nausea or vomiting.  She has no headache.  She has no joint issues.  Overall, her performance status is ECOG 0.  Medications:  Current Outpatient Medications:  .  b complex vitamins tablet, Take 1 tablet by mouth daily., Disp: 100 tablet, Rfl: 3 .  benzonatate (TESSALON) 200 MG capsule, Take 1 capsule (200 mg total) by mouth 3 (three) times daily as needed for cough., Disp: 60 capsule, Rfl: 1 .  Cholecalciferol (VITAMIN D3) 50 MCG (2000 UT) capsule, Take 1 capsule (2,000 Units total) by mouth daily., Disp: 100 capsule, Rfl: 3 .  folic acid (FOLVITE) 1 MG tablet, Take 2 tablets (2 mg total) by mouth daily., Disp: 60 tablet, Rfl: 12 .  rivaroxaban (XARELTO) 20  MG TABS tablet, Take 1 tablet (20 mg total) by mouth daily., Disp: 90 tablet, Rfl: 1  Allergies:  Allergies  Allergen Reactions  . Succinylcholine Shortness Of Breath    Respiratory failure has pseudocholinesterase defiency  . Other     Pseudocholinesterace- prolonged anesthesia. Was on life support after having    Past Medical History, Surgical history, Social history, and Family History were reviewed and updated.  Review of Systems: Review of Systems  Constitutional: Negative.   HENT:  Negative.   Eyes: Negative.   Respiratory: Negative.   Cardiovascular: Negative.   Gastrointestinal: Negative.   Endocrine: Negative.   Genitourinary: Negative.    Musculoskeletal: Negative.   Skin: Negative.   Neurological: Negative.   Hematological: Negative.   Psychiatric/Behavioral: Negative.     Physical Exam:  weight is 138 lb (62.6 kg). Her temporal temperature is 97.6 F (36.4 C). Her blood pressure is 143/78 (abnormal) and her pulse is 68. Her respiration is 16 and oxygen saturation is 99%.   Wt Readings from Last 3 Encounters:  09/17/19 138 lb (62.6 kg)  08/28/19 139 lb (63 kg)  08/06/19 139 lb (63 kg)    Physical Exam Vitals reviewed.  HENT:     Head: Normocephalic and atraumatic.  Eyes:     Pupils: Pupils are equal, round, and reactive to light.  Cardiovascular:     Rate and Rhythm: Normal rate and regular rhythm.     Heart sounds: Normal heart sounds.  Pulmonary:     Effort: Pulmonary effort is normal.     Breath sounds: Normal breath sounds.  Abdominal:     General: Bowel sounds are normal.     Palpations: Abdomen is soft.  Musculoskeletal:        General: No tenderness or deformity. Normal range of motion.     Cervical back: Normal range of motion.  Lymphadenopathy:     Cervical: No cervical adenopathy.  Skin:    General: Skin is warm and dry.     Findings: No erythema or rash.  Neurological:     Mental Status: She is alert and oriented to person, place,  and time.  Psychiatric:        Behavior: Behavior normal.        Thought Content: Thought content normal.        Judgment: Judgment normal.      Lab Results  Component Value Date   WBC 6.3 09/17/2019   HGB 13.3 09/17/2019   HCT 40.7 09/17/2019   MCV 93.3 09/17/2019   PLT 302 09/17/2019     Chemistry      Component Value Date/Time   NA 140 09/17/2019 0910   K 3.9 09/17/2019 0910   CL 103 09/17/2019 0910   CO2 30 09/17/2019 0910   BUN 13 09/17/2019 0910   CREATININE 0.75 09/17/2019 0910      Component Value Date/Time   CALCIUM 9.4 09/17/2019 0910   ALKPHOS 62 09/17/2019 0910   AST 15 09/17/2019 0910   ALT 14 09/17/2019 0910   BILITOT 0.3 09/17/2019 0910      Impression and Plan: Leah Bird is a very charming 69 year old white female.  She certainly looks a lot younger.  She has a thromboembolic event in her left lung.  We did do a CT angiogram on her.  This was done a week ago.  There is no embolus in the lung.  We also did a Doppler of her left leg.  The Doppler showed improvement in the thrombus.  The thrombus was recanalized.  There is no thrombus in the femoral vein.  We will plan to get her back in late July.  She will be headed out to [place], [place] in early July for the birth of her grandson.  I want to make sure that we repeat a Doppler of her left leg before I see her.  I am just glad that she is doing well and she should have no problems on her trip to [place].  She will be active.  She will walk around.  She will drink a lot of water.   Josph Macho, MD 6/14/202110:26 AM

## 2019-09-18 ENCOUNTER — Encounter: Payer: Self-pay | Admitting: Hematology & Oncology

## 2019-09-18 ENCOUNTER — Other Ambulatory Visit: Payer: Self-pay | Admitting: Family

## 2019-09-18 ENCOUNTER — Telehealth: Payer: Self-pay | Admitting: *Deleted

## 2019-09-18 ENCOUNTER — Other Ambulatory Visit: Payer: Self-pay | Admitting: *Deleted

## 2019-09-18 DIAGNOSIS — I82402 Acute embolism and thrombosis of unspecified deep veins of left lower extremity: Secondary | ICD-10-CM

## 2019-09-18 MED FILL — BENZONATATE 200 MG CAPS: 200 | 20 days supply | Qty: 60 | Fill #1

## 2019-09-18 MED FILL — FOLIC ACID 1 MG TABS: 1 | 30 days supply | Qty: 60 | Fill #1

## 2019-09-18 NOTE — Telephone Encounter (Signed)
Patient notified per order of Dr. Myna Hidalgo that "the D-dimer is normal at 0.27."  Pt appreciative of call and requests that Ultrasound be done at the [facility].  Order changed per pt.'s request.  Pt appreciative of call and has no further questions at this time.

## 2019-09-18 NOTE — Telephone Encounter (Signed)
Please call her and tell her that the D-dimer is normal at 0.27. Cindee Lame

## 2019-10-12 ENCOUNTER — Other Ambulatory Visit: Payer: Self-pay

## 2019-10-12 ENCOUNTER — Ambulatory Visit (INDEPENDENT_AMBULATORY_CARE_PROVIDER_SITE_OTHER): Payer: PPO

## 2019-10-12 ENCOUNTER — Ambulatory Visit: Payer: PPO

## 2019-10-12 DIAGNOSIS — Z23 Encounter for immunization: Secondary | ICD-10-CM

## 2019-10-12 NOTE — Progress Notes (Signed)
Pt rec'd tdap & pneumonia vaccination, 1 each arm & tolerated well.

## 2019-10-17 ENCOUNTER — Ambulatory Visit: Payer: PPO | Admitting: Vascular Surgery

## 2019-10-17 ENCOUNTER — Ambulatory Visit (HOSPITAL_COMMUNITY): Payer: PPO

## 2019-10-24 ENCOUNTER — Encounter: Payer: Self-pay | Admitting: Hematology & Oncology

## 2019-10-25 ENCOUNTER — Telehealth: Payer: Self-pay | Admitting: Hematology & Oncology

## 2019-10-25 ENCOUNTER — Other Ambulatory Visit: Payer: Self-pay | Admitting: Family

## 2019-10-25 DIAGNOSIS — I82402 Acute embolism and thrombosis of unspecified deep veins of left lower extremity: Secondary | ICD-10-CM

## 2019-10-26 ENCOUNTER — Telehealth: Payer: Self-pay | Admitting: *Deleted

## 2019-10-26 ENCOUNTER — Ambulatory Visit (HOSPITAL_COMMUNITY)
Admission: RE | Admit: 2019-10-26 | Discharge: 2019-10-26 | Disposition: A | Discharge status: Home / Self Care | Payer: PPO | Source: Ambulatory Visit | Attending: Cardiovascular Disease | Admitting: Cardiovascular Disease

## 2019-10-26 ENCOUNTER — Other Ambulatory Visit: Payer: Self-pay

## 2019-10-26 DIAGNOSIS — I82402 Acute embolism and thrombosis of unspecified deep veins of left lower extremity: Secondary | ICD-10-CM | POA: Diagnosis not present

## 2019-10-26 MED FILL — FOLIC ACID 1 MG TABS: 1 | 30 days supply | Qty: 60 | Fill #2

## 2019-10-26 NOTE — Telephone Encounter (Signed)
Message left on pt.'s private cell phone to notify her per order of Dr. Myna Hidalgo that "the doppler shows that the thrombus is opening up and good blood flow out of the leg.  Keep on the blood thinner!!!  Good luck with the upcoming grandchild in [place]!!"  Instructed pt to call office back with any questions or concerns.

## 2019-10-26 NOTE — Telephone Encounter (Signed)
-----   Message from Josph Macho, MD sent at 10/26/2019  3:22 PM EDT ----- Call - the Doppler shows that the thrombus is opening up and good blood flow out of the leg.  Keep on the blood thinner!!!  Good luck with the upcoming grandchild in [place]!!  Cindee Lame

## 2019-10-29 ENCOUNTER — Encounter: Payer: Self-pay | Admitting: Internal Medicine

## 2019-10-29 ENCOUNTER — Encounter: Payer: Self-pay | Admitting: Hematology & Oncology

## 2019-10-29 ENCOUNTER — Ambulatory Visit (INDEPENDENT_AMBULATORY_CARE_PROVIDER_SITE_OTHER): Payer: PPO | Admitting: Internal Medicine

## 2019-10-29 ENCOUNTER — Other Ambulatory Visit: Payer: Self-pay | Admitting: Internal Medicine

## 2019-10-29 ENCOUNTER — Telehealth: Payer: Self-pay | Admitting: Hematology & Oncology

## 2019-10-29 ENCOUNTER — Other Ambulatory Visit: Payer: Self-pay

## 2019-10-29 DIAGNOSIS — I82402 Acute embolism and thrombosis of unspecified deep veins of left lower extremity: Secondary | ICD-10-CM

## 2019-10-29 DIAGNOSIS — E559 Vitamin D deficiency, unspecified: Secondary | ICD-10-CM | POA: Diagnosis not present

## 2019-10-29 DIAGNOSIS — E538 Deficiency of other specified B group vitamins: Secondary | ICD-10-CM | POA: Diagnosis not present

## 2019-10-29 DIAGNOSIS — I2699 Other pulmonary embolism without acute cor pulmonale: Secondary | ICD-10-CM | POA: Diagnosis not present

## 2019-10-29 DIAGNOSIS — R739 Hyperglycemia, unspecified: Secondary | ICD-10-CM | POA: Diagnosis not present

## 2019-10-29 LAB — BASIC METABOLIC PANEL
BUN: 13 mg/dL (ref 6–23)
CO2: 29 mEq/L (ref 19–32)
Calcium: 8.8 mg/dL (ref 8.4–10.5)
Chloride: 105 mEq/L (ref 96–112)
Creatinine, Ser: 0.67 mg/dL (ref 0.40–1.20)
GFR: 86.93 mL/min (ref >60.00)
Glucose, Bld: 88 mg/dL (ref 70–99)
Potassium: 3.8 mEq/L (ref 3.5–5.1)
Sodium: 141 mEq/L (ref 135–145)

## 2019-10-29 LAB — VITAMIN B12: Vitamin B-12: 298 pg/mL (ref 211–911)

## 2019-10-29 LAB — VITAMIN D 25 HYDROXY (VIT D DEFICIENCY, FRACTURES): VITD: 37.38 ng/mL (ref 30.00–100.00)

## 2019-10-29 LAB — HEMOGLOBIN A1C: Hgb A1c MFr Bld: 5.8 % (ref 4.6–6.5)

## 2019-10-29 MED ORDER — VITAMIN D3 50 MCG (2000 UT) PO CAPS: 4000.0000 [IU] | ORAL_CAPSULE | Freq: Every day | ORAL | 3 refills | Status: DC

## 2019-10-29 MED ORDER — RIVAROXABAN 20 MG PO TABS: 20.0000 mg | ORAL_TABLET | Freq: Every day | ORAL | 1 refills | Status: DC

## 2019-10-29 NOTE — Assessment & Plan Note (Signed)
Vit D 

## 2019-10-29 NOTE — Assessment & Plan Note (Addendum)
F/u w/Dr Myna Hidalgo [place] reviewed

## 2019-10-29 NOTE — Assessment & Plan Note (Signed)
No relapse 

## 2019-10-29 NOTE — Progress Notes (Signed)
Subjective:  Patient ID: Leah Bird, female    DOB: 1951-03-27  Age: 69 y.o. MRN: [id number]  CC: No chief complaint on file.   HPI Delrose B Burbach presents for DVT, B12 and Vit D def f/u  Outpatient Medications Prior to Visit  Medication Sig Dispense Refill  . b complex vitamins tablet Take 1 tablet by mouth daily. 100 tablet 3  . benzonatate (TESSALON) 200 MG capsule Take 1 capsule (200 mg total) by mouth 3 (three) times daily as needed for cough. 60 capsule 1  . Calcium Citrate-Vitamin D (CALCIUM CITRATE +D PO) Take 1 tablet by mouth daily. 600 mg of calcium citrate and 400 mg of D3    . Cholecalciferol (VITAMIN D3) 50 MCG (2000 UT) capsule Take 1 capsule (2,000 Units total) by mouth daily. 100 capsule 3  . folic acid (FOLVITE) 1 MG tablet Take 2 tablets (2 mg total) by mouth daily. 60 tablet 12  . rivaroxaban (XARELTO) 20 MG TABS tablet Take 1 tablet (20 mg total) by mouth daily. 90 tablet 1   No facility-administered medications prior to visit.    ROS: Review of Systems  Constitutional: Negative for activity change, appetite change, chills, fatigue and unexpected weight change.  HENT: Negative for congestion, mouth sores and sinus pressure.   Eyes: Negative for visual disturbance.  Respiratory: Negative for cough and chest tightness.   Gastrointestinal: Negative for abdominal pain and nausea.  Genitourinary: Negative for difficulty urinating, frequency and vaginal pain.  Musculoskeletal: Negative for back pain and gait problem.  Skin: Negative for pallor and rash.  Neurological: Negative for dizziness, tremors, weakness, numbness and headaches.  Psychiatric/Behavioral: Negative for confusion and sleep disturbance.    Objective:  BP 120/80 (BP Location: Right Arm, Patient Position: Sitting, Cuff Size: Normal)   Pulse 65   Temp 98.3 F (36.8 C) (Oral)   Ht 5\' 3"  (1.6 m)   Wt 134 lb (60.8 kg)   SpO2 99%   BMI 23.74 kg/m   BP Readings from Last 3 Encounters:    10/29/19 120/80  09/17/19 (!) 143/78  08/28/19 132/85    Wt Readings from Last 3 Encounters:  10/29/19 134 lb (60.8 kg)  09/17/19 138 lb (62.6 kg)  08/28/19 139 lb (63 kg)    Physical Exam Constitutional:      General: She is not in acute distress.    Appearance: She is well-developed.  HENT:     Head: Normocephalic.     Right Ear: External ear normal.     Left Ear: External ear normal.     Nose: Nose normal.  Eyes:     General:        Right eye: No discharge.        Left eye: No discharge.     Conjunctiva/sclera: Conjunctivae normal.     Pupils: Pupils are equal, round, and reactive to light.  Neck:     Thyroid: No thyromegaly.     Vascular: No JVD.     Trachea: No tracheal deviation.  Cardiovascular:     Rate and Rhythm: Normal rate and regular rhythm.     Heart sounds: Normal heart sounds.  Pulmonary:     Effort: No respiratory distress.     Breath sounds: No stridor. No wheezing.  Abdominal:     General: Bowel sounds are normal. There is no distension.     Palpations: Abdomen is soft. There is no mass.     Tenderness: There is no abdominal tenderness.  There is no guarding or rebound.  Musculoskeletal:        General: No tenderness.     Cervical back: Normal range of motion and neck supple.  Lymphadenopathy:     Cervical: No cervical adenopathy.  Skin:    Findings: No erythema or rash.  Neurological:     Cranial Nerves: No cranial nerve deficit.     Motor: No abnormal muscle tone.     Coordination: Coordination normal.     Deep Tendon Reflexes: Reflexes normal.  Psychiatric:        Behavior: Behavior normal.        Thought Content: Thought content normal.        Judgment: Judgment normal.   No LE edema  Lab Results  Component Value Date   WBC 6.3 09/17/2019   HGB 13.3 09/17/2019   HCT 40.7 09/17/2019   PLT 302 09/17/2019   GLUCOSE 98 09/17/2019   CHOL 247 (H) 04/20/2019   TRIG 111.0 04/20/2019   HDL 59.10 04/20/2019   LDLCALC 166 (H)  04/20/2019   ALT 14 09/17/2019   AST 15 09/17/2019   NA 140 09/17/2019   K 3.9 09/17/2019   CL 103 09/17/2019   CREATININE 0.75 09/17/2019   BUN 13 09/17/2019   CO2 30 09/17/2019   TSH 1.36 04/20/2019   INR 0.91 04/23/2011    VAS [place] LOWER EXTREMITY VENOUS (DVT)  Result Date: 10/26/2019  Lower Venous DVTStudy Indications: History of DVT in the left lower extremity, on Xarelto since 4/21, evaluate for resolution of thrombus. Patient says her leg is feeling somewhat better, she notices less swelling in her ankle now.  Risk Factors: Confirmed PE; DVT LLE found 07/17/19 in the left femoral vein, popliteal vein, profunda vein, popliteal vein, posterior tibial, peroneal and gastrocnemius veins. Comparison Study: Previous study performed 09/11/19 at [facility] showed                   continued DVT in the distal femoral vein and popliteal vein                   with some recanalization. Performing Technologist: Olegario Shearer RVT  Examination Guidelines: A complete evaluation includes B-mode imaging, spectral Doppler, color Doppler, and power Doppler as needed of all accessible portions of each vessel. Bilateral testing is considered an integral part of a complete examination. Limited examinations for reoccurring indications may be performed as noted. The reflux portion of the exam is performed with the patient in reverse Trendelenburg.  +-----+---------------+---------+-----------+----------+--------------+ RIGHTCompressibilityPhasicitySpontaneityPropertiesThrombus Aging +-----+---------------+---------+-----------+----------+--------------+ CFV  Full           Yes      Yes                                 +-----+---------------+---------+-----------+----------+--------------+   +---------+---------------+---------+-----------+----------+--------------+ LEFT     CompressibilityPhasicitySpontaneityPropertiesThrombus Aging  +---------+---------------+---------+-----------+----------+--------------+ CFV      Full           Yes      Yes                                 +---------+---------------+---------+-----------+----------+--------------+ SFJ      Full           Yes      Yes                                 +---------+---------------+---------+-----------+----------+--------------+  FV Prox  Full           Yes      Yes                                 +---------+---------------+---------+-----------+----------+--------------+ FV Mid   Full           Yes      Yes                                 +---------+---------------+---------+-----------+----------+--------------+ FV DistalNone           No       Yes                  Continued      +---------+---------------+---------+-----------+----------+--------------+ PFV      Full                                                        +---------+---------------+---------+-----------+----------+--------------+ POP      None           Yes      Yes                  Continued      +---------+---------------+---------+-----------+----------+--------------+ PTV      Full           Yes      Yes                                 +---------+---------------+---------+-----------+----------+--------------+ PERO     Full           Yes      Yes                                 +---------+---------------+---------+-----------+----------+--------------+ Gastroc  Full                                                        +---------+---------------+---------+-----------+----------+--------------+ GSV      Full           Yes      Yes                                 +---------+---------------+---------+-----------+----------+--------------+   Summary: RIGHT: - No evidence of common femoral vein obstruction.  LEFT: - There is recanalized thrombus with no compression and some recanalized flow in the left distal femoral and popliteal  vein(s). - Findings appear essentially unchanged compared to previous examination. - All other veins visualized appear fully compressible and demonstrate appropriate Doppler characteristics.  *See table(s) above for measurements and observations. Electronically signed by Julien Nordmann MD on 10/26/2019 at 6:10:35 PM.    Final     Assessment & Plan:     Sonda Primes, MD

## 2019-10-29 NOTE — Telephone Encounter (Signed)
I called and spoke with patient regarding appointments that have been moved out per 7/26 sch msg

## 2019-10-29 NOTE — Assessment & Plan Note (Addendum)
On B12 Repeat labs

## 2019-10-31 ENCOUNTER — Inpatient Hospital Stay: Payer: PPO | Admitting: Hematology & Oncology

## 2019-10-31 ENCOUNTER — Inpatient Hospital Stay: Payer: PPO

## 2019-11-05 ENCOUNTER — Other Ambulatory Visit: Payer: Self-pay | Admitting: Internal Medicine

## 2019-11-05 MED FILL — BENZONATATE 200 MG CAPS: 200 | 20 days supply | Qty: 60 | Fill #0

## 2019-11-06 MED FILL — FOLIC ACID 1 MG TABS: 1 | 60 days supply | Qty: 120 | Fill #3

## 2019-12-18 MED FILL — XARELTO 20 MG TABLET: 20 | 90 days supply | Qty: 90 | Fill #1

## 2019-12-25 ENCOUNTER — Encounter: Payer: Self-pay | Admitting: Hematology & Oncology

## 2019-12-25 ENCOUNTER — Inpatient Hospital Stay (HOSPITAL_BASED_OUTPATIENT_CLINIC_OR_DEPARTMENT_OTHER): Payer: PPO | Admitting: Hematology & Oncology

## 2019-12-25 ENCOUNTER — Inpatient Hospital Stay: Payer: PPO | Attending: Hematology & Oncology

## 2019-12-25 ENCOUNTER — Telehealth: Payer: Self-pay | Admitting: Hematology & Oncology

## 2019-12-25 ENCOUNTER — Other Ambulatory Visit: Payer: Self-pay

## 2019-12-25 VITALS — BP 125/67 | HR 62 | Temp 98.2°F | Resp 16 | Wt 136.0 lb

## 2019-12-25 DIAGNOSIS — Z86711 Personal history of pulmonary embolism: Secondary | ICD-10-CM | POA: Diagnosis not present

## 2019-12-25 DIAGNOSIS — I82402 Acute embolism and thrombosis of unspecified deep veins of left lower extremity: Secondary | ICD-10-CM

## 2019-12-25 DIAGNOSIS — Z86718 Personal history of other venous thrombosis and embolism: Secondary | ICD-10-CM | POA: Diagnosis not present

## 2019-12-25 DIAGNOSIS — Z7901 Long term (current) use of anticoagulants: Secondary | ICD-10-CM | POA: Insufficient documentation

## 2019-12-25 DIAGNOSIS — Z79899 Other long term (current) drug therapy: Secondary | ICD-10-CM | POA: Diagnosis not present

## 2019-12-25 LAB — CMP (CANCER CENTER ONLY)
ALT: 14 U/L (ref 0–44)
AST: 15 U/L (ref 15–41)
Albumin: 4.2 g/dL (ref 3.5–5.0)
Alkaline Phosphatase: 70 U/L (ref 38–126)
Anion gap: 5 (ref 5–15)
BUN: 14 mg/dL (ref 8–23)
CO2: 32 mmol/L (ref 22–32)
Calcium: 9.4 mg/dL (ref 8.9–10.3)
Chloride: 103 mmol/L (ref 98–111)
Creatinine: 0.74 mg/dL (ref 0.44–1.00)
GFR, Est AFR Am: >60 mL/min (ref >60)
GFR, Estimated: >60 mL/min (ref >60)
Glucose, Bld: 100 mg/dL — ABNORMAL HIGH (ref 70–99)
Potassium: 3.9 mmol/L (ref 3.5–5.1)
Sodium: 140 mmol/L (ref 135–145)
Total Bilirubin: 0.5 mg/dL (ref 0.3–1.2)
Total Protein: 7.4 g/dL (ref 6.5–8.1)

## 2019-12-25 LAB — CBC WITH DIFFERENTIAL (CANCER CENTER ONLY)
Abs Immature Granulocytes: 0.03 10*3/uL (ref 0.00–0.07)
Basophils Absolute: 0.1 10*3/uL (ref 0.0–0.1)
Basophils Relative: 1 %
Eosinophils Absolute: 0.2 10*3/uL (ref 0.0–0.5)
Eosinophils Relative: 3 %
HCT: 41.1 % (ref 36.0–46.0)
Hemoglobin: 13.4 g/dL (ref 12.0–15.0)
Immature Granulocytes: 0 %
Lymphocytes Relative: 27 %
Lymphs Abs: 2.2 10*3/uL (ref 0.7–4.0)
MCH: 30.9 pg (ref 26.0–34.0)
MCHC: 32.6 g/dL (ref 30.0–36.0)
MCV: 94.7 fL (ref 80.0–100.0)
Monocytes Absolute: 0.6 10*3/uL (ref 0.1–1.0)
Monocytes Relative: 7 %
Neutro Abs: 5.1 10*3/uL (ref 1.7–7.7)
Neutrophils Relative %: 62 %
Platelet Count: 326 10*3/uL (ref 150–400)
RBC: 4.34 MIL/uL (ref 3.87–5.11)
RDW: 12.9 % (ref 11.5–15.5)
WBC Count: 8.2 10*3/uL (ref 4.0–10.5)
nRBC: 0 % (ref 0.0–0.2)

## 2019-12-25 LAB — D-DIMER, QUANTITATIVE: D-Dimer, Quant: 0.39 ug/mL-FEU (ref 0.00–0.50)

## 2019-12-25 NOTE — Telephone Encounter (Signed)
Called and advised patient that her time of appointments had changed due to Venous US that had to be scheduled prior to MD appt in November.  She was ok with the new time

## 2019-12-25 NOTE — Progress Notes (Signed)
Hematology and Oncology Follow Up Visit  Leah Bird [id number] September 08, 1950 69 y.o. 12/25/2019   Principle Diagnosis:   Left pulmonary embolism  Left leg thromboembolic disease femoral vein down to peroneal vein  Lupus anticoagulant positive - ?  Transient  MTHFR -heterozygous  Current Therapy:    Xarelto 20 mg p.o. daily -complete 1 year in April 2022  Folic acid 2 mg p.o. daily     Interim History:  Leah Bird is back for follow-up.  I am so happy for her.  She is now a grandmother.  Her daughter had a son in August.  She was out in [place] for this.  She was out in [place] for a month.  She is doing well.  She does wear her compression stockings.  She had no problems on the plane ride out to [place] and back.  I did have her on some baby aspirin while she was out in [place] in addition to the Xarelto.  She is doing well on the Xarelto.  She has had no bleeding.  She has had no nausea or vomiting.  She has had no issues with the folic acid.  There is been no leg swelling.  She does say at the end of the day the ankle on the left leg does get little bit swollen.  She has had no problems with fever.  She has had no cough or shortness of breath.  Is been no chest wall pain.    Overall, her performance status is ECOG 0.  Medications:  Current Outpatient Medications:  .  b complex vitamins tablet, Take 1 tablet by mouth daily., Disp: 100 tablet, Rfl: 3 .  Calcium Citrate-Vitamin D (CALCIUM CITRATE +D PO), Take 1 tablet by mouth daily. 600 mg of calcium citrate and 400 mg of D3, Disp: , Rfl:  .  Cholecalciferol (VITAMIN D3) 50 MCG (2000 UT) capsule, Take 2 capsules (4,000 Units total) by mouth daily. (Patient taking differently: Take 2,000 Units by mouth daily. ), Disp: 100 capsule, Rfl: 3 .  folic acid (FOLVITE) 1 MG tablet, Take 2 tablets (2 mg total) by mouth daily., Disp: 60 tablet, Rfl: 12 .  rivaroxaban (XARELTO) 20 MG TABS tablet, Take 1 tablet (20 mg  total) by mouth daily. Please accommodate for travel, Disp: 90 tablet, Rfl: 1  Allergies:  Allergies  Allergen Reactions  . Succinylcholine Shortness Of Breath    Respiratory failure has pseudocholinesterase defiency  . Other     Pseudocholinesterace- prolonged anesthesia. Was on life support after having    Past Medical History, Surgical history, Social history, and Family History were reviewed and updated.  Review of Systems: Review of Systems  Constitutional: Negative.   HENT:  Negative.   Eyes: Negative.   Respiratory: Negative.   Cardiovascular: Negative.   Gastrointestinal: Negative.   Endocrine: Negative.   Genitourinary: Negative.    Musculoskeletal: Negative.   Skin: Negative.   Neurological: Negative.   Hematological: Negative.   Psychiatric/Behavioral: Negative.     Physical Exam:  vitals were not taken for this visit.   Wt Readings from Last 3 Encounters:  10/29/19 134 lb (60.8 kg)  09/17/19 138 lb (62.6 kg)  08/28/19 139 lb (63 kg)    Physical Exam Vitals reviewed.  HENT:     Head: Normocephalic and atraumatic.  Eyes:     Pupils: Pupils are equal, round, and reactive to light.  Cardiovascular:     Rate and Rhythm: Normal rate and regular rhythm.  Heart sounds: Normal heart sounds.  Pulmonary:     Effort: Pulmonary effort is normal.     Breath sounds: Normal breath sounds.  Abdominal:     General: Bowel sounds are normal.     Palpations: Abdomen is soft.  Musculoskeletal:        General: No tenderness or deformity. Normal range of motion.     Cervical back: Normal range of motion.  Lymphadenopathy:     Cervical: No cervical adenopathy.  Skin:    General: Skin is warm and dry.     Findings: No erythema or rash.  Neurological:     Mental Status: She is alert and oriented to person, place, and time.  Psychiatric:        Behavior: Behavior normal.        Thought Content: Thought content normal.        Judgment: Judgment normal.       Lab Results  Component Value Date   WBC 8.2 12/25/2019   HGB 13.4 12/25/2019   HCT 41.1 12/25/2019   MCV 94.7 12/25/2019   PLT 326 12/25/2019     Chemistry      Component Value Date/Time   NA 140 12/25/2019 0948   K 3.9 12/25/2019 0948   CL 103 12/25/2019 0948   CO2 32 12/25/2019 0948   BUN 14 12/25/2019 0948   CREATININE 0.74 12/25/2019 0948      Component Value Date/Time   CALCIUM 9.4 12/25/2019 0948   ALKPHOS 70 12/25/2019 0948   AST 15 12/25/2019 0948   ALT 14 12/25/2019 0948   BILITOT 0.5 12/25/2019 0948      Impression and Plan: Leah Bird is a very charming 69 year old white female.  She certainly looks a lot younger.  She has a thromboembolic event in her left lung.  We did do a CT angiogram on her.  This was done a week ago.  There is no embolus in the lung.  We also did a Doppler of her left leg.  The Doppler showed improvement in the thrombus.  The thrombus was recanalized.  There is no thrombus in the femoral vein.  She does not plan to go to [place] now until Thanksgiving.  I would like to see her back before she goes out to [place] again.  I would like to do another Doppler of her left leg so we can see how everything looks.  We will probably do the same thing for her in November as we did in August.  We will have a little bit of aspirin.   Josph Macho, MD 9/21/202110:22 AM

## 2019-12-26 LAB — LUPUS ANTICOAGULANT PANEL
DRVVT: 31 s (ref 0.0–47.0)
PTT Lupus Anticoagulant: 29.9 s (ref 0.0–51.9)

## 2019-12-27 LAB — CARDIOLIPIN ANTIBODIES, IGG, IGM, IGA
Anticardiolipin IgA: <9 APL U/mL (ref 0–11)
Anticardiolipin IgG: <9 GPL U/mL (ref 0–14)
Anticardiolipin IgM: 10 MPL U/mL (ref 0–12)

## 2020-01-28 MED FILL — FOLIC ACID 1 MG TABS: 1 | 60 days supply | Qty: 120 | Fill #4

## 2020-02-18 ENCOUNTER — Ambulatory Visit (HOSPITAL_BASED_OUTPATIENT_CLINIC_OR_DEPARTMENT_OTHER)
Admission: RE | Admit: 2020-02-18 | Discharge: 2020-02-18 | Disposition: A | Discharge status: Home / Self Care | Payer: PPO | Source: Ambulatory Visit | Attending: Hematology & Oncology | Admitting: Hematology & Oncology

## 2020-02-18 ENCOUNTER — Other Ambulatory Visit: Payer: Self-pay

## 2020-02-18 ENCOUNTER — Telehealth: Payer: Self-pay

## 2020-02-18 ENCOUNTER — Inpatient Hospital Stay (HOSPITAL_BASED_OUTPATIENT_CLINIC_OR_DEPARTMENT_OTHER): Payer: PPO | Admitting: Hematology & Oncology

## 2020-02-18 ENCOUNTER — Encounter: Payer: Self-pay | Admitting: Hematology & Oncology

## 2020-02-18 ENCOUNTER — Inpatient Hospital Stay: Payer: PPO | Attending: Hematology & Oncology

## 2020-02-18 VITALS — BP 132/81 | HR 64 | Temp 98.4°F | Resp 18 | Wt 136.0 lb

## 2020-02-18 DIAGNOSIS — Z86711 Personal history of pulmonary embolism: Secondary | ICD-10-CM | POA: Insufficient documentation

## 2020-02-18 DIAGNOSIS — Z86718 Personal history of other venous thrombosis and embolism: Secondary | ICD-10-CM | POA: Insufficient documentation

## 2020-02-18 DIAGNOSIS — I82402 Acute embolism and thrombosis of unspecified deep veins of left lower extremity: Secondary | ICD-10-CM

## 2020-02-18 DIAGNOSIS — Z79899 Other long term (current) drug therapy: Secondary | ICD-10-CM | POA: Diagnosis not present

## 2020-02-18 DIAGNOSIS — Z7901 Long term (current) use of anticoagulants: Secondary | ICD-10-CM | POA: Insufficient documentation

## 2020-02-18 DIAGNOSIS — I878 Other specified disorders of veins: Secondary | ICD-10-CM | POA: Diagnosis not present

## 2020-02-18 LAB — CBC WITH DIFFERENTIAL (CANCER CENTER ONLY)
Abs Immature Granulocytes: 0.02 10*3/uL (ref 0.00–0.07)
Basophils Absolute: 0 10*3/uL (ref 0.0–0.1)
Basophils Relative: 1 %
Eosinophils Absolute: 0.2 10*3/uL (ref 0.0–0.5)
Eosinophils Relative: 3 %
HCT: 41.2 % (ref 36.0–46.0)
Hemoglobin: 13.5 g/dL (ref 12.0–15.0)
Immature Granulocytes: 0 %
Lymphocytes Relative: 28 %
Lymphs Abs: 2 10*3/uL (ref 0.7–4.0)
MCH: 31 pg (ref 26.0–34.0)
MCHC: 32.8 g/dL (ref 30.0–36.0)
MCV: 94.7 fL (ref 80.0–100.0)
Monocytes Absolute: 0.6 10*3/uL (ref 0.1–1.0)
Monocytes Relative: 8 %
Neutro Abs: 4.4 10*3/uL (ref 1.7–7.7)
Neutrophils Relative %: 60 %
Platelet Count: 314 10*3/uL (ref 150–400)
RBC: 4.35 MIL/uL (ref 3.87–5.11)
RDW: 13.1 % (ref 11.5–15.5)
WBC Count: 7.3 10*3/uL (ref 4.0–10.5)
nRBC: 0 % (ref 0.0–0.2)

## 2020-02-18 LAB — CMP (CANCER CENTER ONLY)
ALT: 11 U/L (ref 0–44)
AST: 14 U/L — ABNORMAL LOW (ref 15–41)
Albumin: 4.1 g/dL (ref 3.5–5.0)
Alkaline Phosphatase: 73 U/L (ref 38–126)
Anion gap: 6 (ref 5–15)
BUN: 13 mg/dL (ref 8–23)
CO2: 32 mmol/L (ref 22–32)
Calcium: 9.6 mg/dL (ref 8.9–10.3)
Chloride: 103 mmol/L (ref 98–111)
Creatinine: 0.81 mg/dL (ref 0.44–1.00)
GFR, Estimated: >60 mL/min (ref >60)
Glucose, Bld: 101 mg/dL — ABNORMAL HIGH (ref 70–99)
Potassium: 4.2 mmol/L (ref 3.5–5.1)
Sodium: 141 mmol/L (ref 135–145)
Total Bilirubin: 0.4 mg/dL (ref 0.3–1.2)
Total Protein: 7 g/dL (ref 6.5–8.1)

## 2020-02-18 NOTE — Telephone Encounter (Signed)
05/20/20 appts made and printed per 02/18/20 LOS...   AOM

## 2020-02-18 NOTE — Progress Notes (Signed)
Hematology and Oncology Follow Up Visit  Leah Bird [id number] [birth year] 69 y.o. 02/18/2020   Principle Diagnosis:   Left pulmonary embolism  Left leg thromboembolic disease femoral vein down to peroneal vein  Lupus anticoagulant positive - ?  Transient  MTHFR -heterozygous  Current Therapy:    Xarelto 20 mg p.o. daily -complete 1 year in April 2022  Folic acid 2 mg p.o. daily     Interim History:  Leah Bird is back for follow-up.  Everything is a 4 is going well.  Leah Bird really has had no complaints with the left leg.  Leah Bird has been going out to [place].  Leah Bird and Leah Bird husband will be going out for Thanksgiving.  They are actually leaving this Friday.  When Leah Bird travels out to [place], Leah Bird takes aspirin for a few days.  Only last saw Leah Bird, we did check Leah Bird for lupus anticoagulant and this was negative.  Leah Bird has had no issues with bleeding.  Leah Bird has had no problems with cough or shortness of breath.  There is no chest wall pain.  We did do a Doppler of Leah Bird left leg.  There is chronic thickening of the left lower leg veins.  There is no acute thrombus.  Leah Bird suspect that Leah Bird will always have this.  Leah Bird has had no problems with leg swelling.  There is no leg pain.  Leah Bird has had no change in bowel or bladder habits.  Overall, Leah Bird performance status is ECOG 0.    Medications:  Current Outpatient Medications:  .  b complex vitamins tablet, Take 1 tablet by mouth daily., Disp: 100 tablet, Rfl: 3 .  Calcium Citrate-Vitamin D (CALCIUM CITRATE +D PO), Take 1 tablet by mouth daily. 600 mg of calcium citrate and 400 mg of D3, Disp: , Rfl:  .  Cholecalciferol (VITAMIN D3) 50 MCG (2000 UT) capsule, Take 2 capsules (4,000 Units total) by mouth daily. (Patient taking differently: Take 2,000 Units by mouth daily. ), Disp: 100 capsule, Rfl: 3 .  FLUAD QUADRIVALENT 0.5 ML injection, , Disp: , Rfl:  .  folic acid (FOLVITE) 1 MG tablet, Take 2 tablets (2 mg total) by mouth daily., Disp: 60  tablet, Rfl: 12 .  rivaroxaban (XARELTO) 20 MG TABS tablet, Take 1 tablet (20 mg total) by mouth daily. Please accommodate for travel, Disp: 90 tablet, Rfl: 1  Allergies:  Allergies  Allergen Reactions  . Succinylcholine Shortness Of Breath    Respiratory failure has pseudocholinesterase defiency  . Other     Pseudocholinesterace- prolonged anesthesia. Was on life support after having    Past Medical History, Surgical history, Social history, and Family History were reviewed and updated.  Review of Systems: Review of Systems  Constitutional: Negative.   HENT:  Negative.   Eyes: Negative.   Respiratory: Negative.   Cardiovascular: Negative.   Gastrointestinal: Negative.   Endocrine: Negative.   Genitourinary: Negative.    Musculoskeletal: Negative.   Skin: Negative.   Neurological: Negative.   Hematological: Negative.   Psychiatric/Behavioral: Negative.     Physical Exam:  weight is 136 lb (61.7 kg). Leah Bird oral temperature is 98.4 F (36.9 C). Leah Bird blood pressure is 132/81 and Leah Bird pulse is 64. Leah Bird respiration is 18 and oxygen saturation is 100%.   Wt Readings from Last 3 Encounters:  02/18/20 136 lb (61.7 kg)  12/25/19 136 lb (61.7 kg)  10/29/19 134 lb (60.8 kg)    Physical Exam Vitals reviewed.  HENT:     Head: Normocephalic  and atraumatic.  Eyes:     Pupils: Pupils are equal, round, and reactive to light.  Cardiovascular:     Rate and Rhythm: Normal rate and regular rhythm.     Heart sounds: Normal heart sounds.  Pulmonary:     Effort: Pulmonary effort is normal.     Breath sounds: Normal breath sounds.  Abdominal:     General: Bowel sounds are normal.     Palpations: Abdomen is soft.  Musculoskeletal:        General: No tenderness or deformity. Normal range of motion.     Cervical back: Normal range of motion.  Lymphadenopathy:     Cervical: No cervical adenopathy.  Skin:    General: Skin is warm and dry.     Findings: No erythema or rash.  Neurological:      Mental Status: Leah Bird is alert and oriented to person, place, and time.  Psychiatric:        Behavior: Behavior normal.        Thought Content: Thought content normal.        Judgment: Judgment normal.      Lab Results  Component Value Date   WBC 7.3 02/18/2020   HGB 13.5 02/18/2020   HCT 41.2 02/18/2020   MCV 94.7 02/18/2020   PLT 314 02/18/2020     Chemistry      Component Value Date/Time   NA 141 02/18/2020 0845   K 4.2 02/18/2020 0845   CL 103 02/18/2020 0845   CO2 32 02/18/2020 0845   BUN 13 02/18/2020 0845   CREATININE 0.81 02/18/2020 0845      Component Value Date/Time   CALCIUM 9.6 02/18/2020 0845   ALKPHOS 73 02/18/2020 0845   AST 14 (L) 02/18/2020 0845   ALT 11 02/18/2020 0845   BILITOT 0.4 02/18/2020 0845      Impression and Plan: [name] is a very charming 69 year old white female.  Leah Bird certainly looks a lot younger.  Leah Bird has a thromboembolic event in Leah Bird left lung.  We did do a CT angiogram on Leah Bird.  This was done a week ago.  There is no embolus in the lung.  We also did a Doppler of Leah Bird left leg.  The Doppler showed chronic thickening of the wall of the left lower leg veins.  Again this is nonocclusive.  Leah Bird will complete 1 year of therapeutic Xarelto in April.  We will then go to maintenance Xarelto.  Again, Leah Bird will be headed out to [place] for Thanksgiving.  Leah Bird will be out there for 3 weeks.  Leah Bird knows what to do with the aspirin.  I do not think we have to do any additional Dopplers of Leah Bird left leg unless Leah Bird has symptoms.  We will plan to get Leah Bird back to see [place] in 3 months.    Josph Macho, MD 11/15/20219:55 AM

## 2020-03-24 MED FILL — XARELTO 20 MG TABLET: 20 | 30 days supply | Qty: 30 | Fill #1

## 2020-03-24 MED FILL — FOLIC ACID 1 MG TABS: 1 | 60 days supply | Qty: 120 | Fill #5

## 2020-04-01 ENCOUNTER — Other Ambulatory Visit: Payer: Self-pay | Admitting: Internal Medicine

## 2020-04-08 ENCOUNTER — Other Ambulatory Visit: Payer: Self-pay | Admitting: Internal Medicine

## 2020-04-08 MED ORDER — RIVAROXABAN 20 MG PO TABS
ORAL_TABLET | ORAL | 0 refills | Status: DC
Start: 2020-04-08 — End: 2020-05-20

## 2020-04-10 DIAGNOSIS — U071 COVID-19: Secondary | ICD-10-CM | POA: Diagnosis not present

## 2020-04-16 MED FILL — XARELTO 20 MG TABLET: 20 | 90 days supply | Qty: 90 | Fill #0

## 2020-05-13 ENCOUNTER — Other Ambulatory Visit (HOSPITAL_COMMUNITY): Payer: Self-pay | Admitting: Obstetrics & Gynecology

## 2020-05-13 MED FILL — NITROFURANTOIN MONO-MCR 100: 100 | 7 days supply | Qty: 14 | Fill #0

## 2020-05-20 ENCOUNTER — Other Ambulatory Visit: Payer: Self-pay | Admitting: Hematology & Oncology

## 2020-05-20 ENCOUNTER — Other Ambulatory Visit: Payer: Self-pay

## 2020-05-20 ENCOUNTER — Encounter: Payer: Self-pay | Admitting: Hematology & Oncology

## 2020-05-20 ENCOUNTER — Inpatient Hospital Stay: Payer: PPO | Admitting: Hematology & Oncology

## 2020-05-20 ENCOUNTER — Inpatient Hospital Stay: Payer: PPO | Attending: Hematology & Oncology

## 2020-05-20 VITALS — BP 123/80 | HR 71 | Temp 98.4°F | Resp 16 | Wt 138.0 lb

## 2020-05-20 DIAGNOSIS — Z86711 Personal history of pulmonary embolism: Secondary | ICD-10-CM | POA: Insufficient documentation

## 2020-05-20 DIAGNOSIS — Z86718 Personal history of other venous thrombosis and embolism: Secondary | ICD-10-CM | POA: Diagnosis not present

## 2020-05-20 DIAGNOSIS — Z7901 Long term (current) use of anticoagulants: Secondary | ICD-10-CM | POA: Insufficient documentation

## 2020-05-20 DIAGNOSIS — I82402 Acute embolism and thrombosis of unspecified deep veins of left lower extremity: Secondary | ICD-10-CM

## 2020-05-20 DIAGNOSIS — I82401 Acute embolism and thrombosis of unspecified deep veins of right lower extremity: Secondary | ICD-10-CM

## 2020-05-20 LAB — CMP (CANCER CENTER ONLY)
ALT: 13 U/L (ref 0–44)
AST: 15 U/L (ref 15–41)
Albumin: 4.3 g/dL (ref 3.5–5.0)
Alkaline Phosphatase: 68 U/L (ref 38–126)
Anion gap: 4 — ABNORMAL LOW (ref 5–15)
BUN: 14 mg/dL (ref 8–23)
CO2: 35 mmol/L — ABNORMAL HIGH (ref 22–32)
Calcium: 9.7 mg/dL (ref 8.9–10.3)
Chloride: 100 mmol/L (ref 98–111)
Creatinine: 0.79 mg/dL (ref 0.44–1.00)
GFR, Estimated: >60 mL/min (ref >60)
Glucose, Bld: 95 mg/dL (ref 70–99)
Potassium: 4.2 mmol/L (ref 3.5–5.1)
Sodium: 139 mmol/L (ref 135–145)
Total Bilirubin: 0.5 mg/dL (ref 0.3–1.2)
Total Protein: 7 g/dL (ref 6.5–8.1)

## 2020-05-20 LAB — CBC WITH DIFFERENTIAL (CANCER CENTER ONLY)
Abs Immature Granulocytes: 0.01 10*3/uL (ref 0.00–0.07)
Basophils Absolute: 0.1 10*3/uL (ref 0.0–0.1)
Basophils Relative: 1 %
Eosinophils Absolute: 0.2 10*3/uL (ref 0.0–0.5)
Eosinophils Relative: 3 %
HCT: 41 % (ref 36.0–46.0)
Hemoglobin: 13.5 g/dL (ref 12.0–15.0)
Immature Granulocytes: 0 %
Lymphocytes Relative: 27 %
Lymphs Abs: 2 10*3/uL (ref 0.7–4.0)
MCH: 31.1 pg (ref 26.0–34.0)
MCHC: 32.9 g/dL (ref 30.0–36.0)
MCV: 94.5 fL (ref 80.0–100.0)
Monocytes Absolute: 0.6 10*3/uL (ref 0.1–1.0)
Monocytes Relative: 8 %
Neutro Abs: 4.6 10*3/uL (ref 1.7–7.7)
Neutrophils Relative %: 61 %
Platelet Count: 326 10*3/uL (ref 150–400)
RBC: 4.34 MIL/uL (ref 3.87–5.11)
RDW: 13 % (ref 11.5–15.5)
WBC Count: 7.5 10*3/uL (ref 4.0–10.5)
nRBC: 0 % (ref 0.0–0.2)

## 2020-05-20 LAB — D-DIMER, QUANTITATIVE: D-Dimer, Quant: 0.37 ug/mL-FEU (ref 0.00–0.50)

## 2020-05-20 MED ORDER — RIVAROXABAN 10 MG PO TABS: ORAL_TABLET | ORAL | 3 refills | Status: DC

## 2020-05-20 MED FILL — XARELTO 10 MG TABLET: 10 | 90 days supply | Qty: 90 | Fill #0

## 2020-05-20 NOTE — Progress Notes (Signed)
Hematology and Oncology Follow Up Visit  Leah Bird [id number] [birth year] 70 y.o. 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  Transient  MTHFR -heterozygous  Current Therapy:    Xarelto 20 mg p.o. daily -complete 1 year in April 2022  Xarelto 10 mg po q day -- maintenance -  Start on 07/2020  Folic acid 2 mg p.o. daily     Interim History:  Leah Bird is back for follow-up.  We saw Leah 3 months ago.  She is still going out to [place] on occasion.  She was last there I think around the holidays.  Leah Bird did develop Covid in early January.  Thankfully he was not hospitalized.  She thinks that the will be going back to [place] in March for a few days.  She has had no problems with traveling.  She is very diligent with using Leah compression stocking on Leah leg.  She also takes aspirin while she is traveling.  She is on Xarelto 20 mg a day.  She will complete 1 year of this in April 2022.  We will then put Leah on maintenance Xarelto 10 mg a day.  She has had no bleeding.  There is been no cough or shortness of breath.  She has had no nausea or vomiting.  Overall, I would say Leah performance status is ECOG 0.  She and Leah Bird are exercising.  With warmer weather, they are doing a lot of walking.      Medications:  Current Outpatient Medications:  .  b complex vitamins tablet, Take 1 tablet by mouth daily., Disp: 100 tablet, Rfl: 3 .  Calcium Citrate-Vitamin D (CALCIUM CITRATE +D PO), Take 1 tablet by mouth daily. 600 mg of calcium citrate and 400 mg of D3, Disp: , Rfl:  .  Cholecalciferol (VITAMIN D3) 50 MCG (2000 UT) capsule, Take 2 capsules (4,000 Units total) by mouth daily. (Patient taking differently: Take 2,000 Units by mouth daily. ), Disp: 100 capsule, Rfl: 3 .  FLUAD QUADRIVALENT 0.5 ML injection, , Disp: , Rfl:  .  folic acid  (FOLVITE) 1 MG tablet, Take 2 tablets (2 mg total) by mouth daily., Disp: 60 tablet, Rfl: 12 .  rivaroxaban (XARELTO) 20 MG TABS tablet, TAKE 1 TABLET (20 MG TOTAL) BY MOUTH DAILY., Disp: 90 tablet, Rfl: 0  Allergies:  Allergies  Allergen Reactions  . Succinylcholine Shortness Of Breath    Respiratory failure has pseudocholinesterase defiency  . Other     Pseudocholinesterace- prolonged anesthesia. Was on life support after having    Past Medical History, Surgical history, Social history, and Family History were reviewed and updated.  Review of Systems: Review of Systems  Constitutional: Negative.   HENT:  Negative.   Eyes: Negative.   Respiratory: Negative.   Cardiovascular: Negative.   Gastrointestinal: Negative.   Endocrine: Negative.   Genitourinary: Negative.    Musculoskeletal: Negative.   Skin: Negative.   Neurological: Negative.   Hematological: Negative.   Psychiatric/Behavioral: Negative.     Physical Exam:  weight is 138 lb (62.6 kg). Leah oral temperature is 98.4 F (36.9 C). Leah blood pressure is 123/80 and Leah pulse is 71. Leah respiration is 16 and oxygen saturation is 99%.   Wt Readings from Last 3 Encounters:  05/20/20 138 lb (62.6 kg)  02/18/20 136 lb (61.7 kg)  12/25/19 136 lb (61.7 kg)  Physical Exam Vitals reviewed.  HENT:     Head: Normocephalic and atraumatic.  Eyes:     Pupils: Pupils are equal, round, and reactive to light.  Cardiovascular:     Rate and Rhythm: Normal rate and regular rhythm.     Heart sounds: Normal heart sounds.  Pulmonary:     Effort: Pulmonary effort is normal.     Breath sounds: Normal breath sounds.  Abdominal:     General: Bowel sounds are normal.     Palpations: Abdomen is soft.  Musculoskeletal:        General: No tenderness or deformity. Normal range of motion.     Cervical back: Normal range of motion.  Lymphadenopathy:     Cervical: No cervical adenopathy.  Skin:    General: Skin is warm and dry.      Findings: No erythema or rash.  Neurological:     Mental Status: She is alert and oriented to person, place, and time.  Psychiatric:        Behavior: Behavior normal.        Thought Content: Thought content normal.        Judgment: Judgment normal.      Lab Results  Component Value Date   WBC 7.5 05/20/2020   HGB 13.5 05/20/2020   HCT 41.0 05/20/2020   MCV 94.5 05/20/2020   PLT 326 05/20/2020     Chemistry      Component Value Date/Time   NA 139 05/20/2020 0902   K 4.2 05/20/2020 0902   CL 100 05/20/2020 0902   CO2 35 (H) 05/20/2020 0902   BUN 14 05/20/2020 0902   CREATININE 0.79 05/20/2020 0902      Component Value Date/Time   CALCIUM 9.7 05/20/2020 0902   ALKPHOS 68 05/20/2020 0902   AST 15 05/20/2020 0902   ALT 13 05/20/2020 0902   BILITOT 0.5 05/20/2020 0902      Impression and Plan: [name] is a very charming 70 year old white female.  She certainly looks a lot younger.  She is doing quite nicely.  She is having no problems at all with the Xarelto.  She does not need any Doppler right now.  We will continue Leah on the Xarelto.  We will switch Leah over to maintenance therapy in April.  Again, she knows to be very proactive when she travels.  She drinks a lot of water.  She walks around on the airplane when she travels.  We will plan to get Leah back to see [place] in 3 months.    Josph Macho, MD 2/15/20229:38 AM

## 2020-05-21 LAB — LUPUS ANTICOAGULANT PANEL
DRVVT: 31.1 s (ref 0.0–47.0)
PTT Lupus Anticoagulant: 29.4 s (ref 0.0–51.9)

## 2020-05-22 LAB — CARDIOLIPIN ANTIBODIES, IGG, IGM, IGA
Anticardiolipin IgA: <9 APL U/mL (ref 0–11)
Anticardiolipin IgG: <9 GPL U/mL (ref 0–14)
Anticardiolipin IgM: 12 MPL U/mL (ref 0–12)

## 2020-06-11 MED FILL — FOLIC ACID 1 MG TABS: 1 | 60 days supply | Qty: 120 | Fill #6

## 2020-08-12 ENCOUNTER — Other Ambulatory Visit (HOSPITAL_COMMUNITY): Payer: Self-pay

## 2020-08-12 ENCOUNTER — Telehealth: Payer: Self-pay

## 2020-08-12 ENCOUNTER — Encounter: Payer: Self-pay | Admitting: Hematology & Oncology

## 2020-08-12 ENCOUNTER — Other Ambulatory Visit: Payer: Self-pay

## 2020-08-12 ENCOUNTER — Inpatient Hospital Stay: Payer: PPO | Attending: Hematology & Oncology

## 2020-08-12 ENCOUNTER — Inpatient Hospital Stay (HOSPITAL_BASED_OUTPATIENT_CLINIC_OR_DEPARTMENT_OTHER): Payer: PPO | Admitting: Hematology & Oncology

## 2020-08-12 VITALS — BP 123/64 | HR 66 | Temp 98.1°F | Resp 18 | Wt 133.2 lb

## 2020-08-12 DIAGNOSIS — Z86711 Personal history of pulmonary embolism: Secondary | ICD-10-CM | POA: Diagnosis not present

## 2020-08-12 DIAGNOSIS — I82401 Acute embolism and thrombosis of unspecified deep veins of right lower extremity: Secondary | ICD-10-CM | POA: Diagnosis not present

## 2020-08-12 DIAGNOSIS — Z86718 Personal history of other venous thrombosis and embolism: Secondary | ICD-10-CM | POA: Insufficient documentation

## 2020-08-12 DIAGNOSIS — Z7901 Long term (current) use of anticoagulants: Secondary | ICD-10-CM | POA: Diagnosis not present

## 2020-08-12 LAB — CMP (CANCER CENTER ONLY)
ALT: 12 U/L (ref 0–44)
AST: 15 U/L (ref 15–41)
Albumin: 4.3 g/dL (ref 3.5–5.0)
Alkaline Phosphatase: 63 U/L (ref 38–126)
Anion gap: 6 (ref 5–15)
BUN: 17 mg/dL (ref 8–23)
CO2: 32 mmol/L (ref 22–32)
Calcium: 9.9 mg/dL (ref 8.9–10.3)
Chloride: 101 mmol/L (ref 98–111)
Creatinine: 0.77 mg/dL (ref 0.44–1.00)
GFR, Estimated: >60 mL/min (ref >60)
Glucose, Bld: 103 mg/dL — ABNORMAL HIGH (ref 70–99)
Potassium: 4 mmol/L (ref 3.5–5.1)
Sodium: 139 mmol/L (ref 135–145)
Total Bilirubin: 0.3 mg/dL (ref 0.3–1.2)
Total Protein: 7.5 g/dL (ref 6.5–8.1)

## 2020-08-12 LAB — CBC WITH DIFFERENTIAL (CANCER CENTER ONLY)
Abs Immature Granulocytes: 0.03 10*3/uL (ref 0.00–0.07)
Basophils Absolute: 0.1 10*3/uL (ref 0.0–0.1)
Basophils Relative: 1 %
Eosinophils Absolute: 0.2 10*3/uL (ref 0.0–0.5)
Eosinophils Relative: 2 %
HCT: 43.1 % (ref 36.0–46.0)
Hemoglobin: 14.4 g/dL (ref 12.0–15.0)
Immature Granulocytes: 0 %
Lymphocytes Relative: 25 %
Lymphs Abs: 2.4 10*3/uL (ref 0.7–4.0)
MCH: 31.3 pg (ref 26.0–34.0)
MCHC: 33.4 g/dL (ref 30.0–36.0)
MCV: 93.7 fL (ref 80.0–100.0)
Monocytes Absolute: 0.6 10*3/uL (ref 0.1–1.0)
Monocytes Relative: 7 %
Neutro Abs: 6.3 10*3/uL (ref 1.7–7.7)
Neutrophils Relative %: 65 %
Platelet Count: 383 10*3/uL (ref 150–400)
RBC: 4.6 MIL/uL (ref 3.87–5.11)
RDW: 12.7 % (ref 11.5–15.5)
WBC Count: 9.6 10*3/uL (ref 4.0–10.5)
nRBC: 0 % (ref 0.0–0.2)

## 2020-08-12 LAB — D-DIMER, QUANTITATIVE: D-Dimer, Quant: 0.53 ug/mL-FEU — ABNORMAL HIGH (ref 0.00–0.50)

## 2020-08-12 MED ORDER — HYDROCODONE-ACETAMINOPHEN 5-325 MG PO TABS
1.0000 | ORAL_TABLET | Freq: Four times a day (QID) | ORAL | 0 refills | Status: DC | PRN
  Filled 2020-08-12: qty 30, 7d supply, fill #0

## 2020-08-12 MED FILL — Folic Acid Tab 1 MG: ORAL | 30 days supply | Qty: 60 | Fill #0 | Status: AC

## 2020-08-12 NOTE — Telephone Encounter (Signed)
S/w pt and she is aware of her f.u appt per 08/12/20 los   Leah Bird 

## 2020-08-12 NOTE — Progress Notes (Signed)
Hematology and Oncology Follow Up Visit  Leah Bird [id number] Jul 26, 1950 70 y.o. 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  Transient  MTHFR -heterozygous  Current Therapy:    Xarelto 20 mg p.o. daily -complete 1 year in April 2022  Xarelto 10 mg po q day -- maintenance -  Start on 07/2020  Folic acid 2 mg p.o. daily     Interim History:  Leah Bird is back for follow-up.  As usual, she will be heading back out to [place] in a couple weeks.  Her grandson is out there.  She really enjoys going out there.  The big event will be his first birthday in August.  She is done well on the Xarelto.  She is on maintenance Xarelto right now.  She is having no problems with this.  There is no bleeding.  She has had no problems with nausea or vomiting.  She has had no change in bowel or bladder habits.  Does have some lower back issues.  I told her that it would be okay to take Advil or Aleve as long she takes it with food.  This does seem to help with the discomfort.  And she has had no problems with rashes.  There is been no leg swelling.  She has had no chest wall pain.  Overall, her performance status is ECOG 1.    Medications:  Current Outpatient Medications:  .  b complex vitamins tablet, Take 1 tablet by mouth daily., Disp: 100 tablet, Rfl: 3 .  Calcium Citrate-Vitamin D (CALCIUM CITRATE +D PO), Take 1 tablet by mouth daily. 600 mg of calcium citrate and 400 mg of D3, Disp: , Rfl:  .  Cholecalciferol (VITAMIN D3) 50 MCG (2000 UT) capsule, Take 2 capsules (4,000 Units total) by mouth daily. (Patient taking differently: Take 2,000 Units by mouth daily.), Disp: 100 capsule, Rfl: 3 .  FLUAD QUADRIVALENT 0.5 ML injection, , Disp: , Rfl:  .  folic acid (FOLVITE) 1 MG tablet, TAKE 2 TABLETS (2 MG TOTAL) BY MOUTH DAILY., Disp: 60 tablet, Rfl: 12 .   HYDROcodone-acetaminophen (NORCO/VICODIN) 5-325 MG tablet, Take 1 tablet by mouth every 6 (six) hours as needed for moderate pain., Disp: , Rfl:  .  rivaroxaban (XARELTO) 10 MG TABS tablet, TAKE 1 TABLET (20 MG TOTAL) BY MOUTH DAILY., Disp: 90 tablet, Rfl: 3 .  nitrofurantoin, macrocrystal-monohydrate, (MACROBID) 100 MG capsule, TAKE 1 CAPSULE EVERY 12 HOURS BY MOUTH FOR 7 DAYS, Disp: 14 capsule, Rfl: 0  Allergies:  Allergies  Allergen Reactions  . Succinylcholine Shortness Of Breath    Respiratory failure has pseudocholinesterase defiency  . Other     Pseudocholinesterace- prolonged anesthesia. Was on life support after having    Past Medical History, Surgical history, Social history, and Family History were reviewed and updated.  Review of Systems: Review of Systems  Constitutional: Negative.   HENT:  Negative.   Eyes: Negative.   Respiratory: Negative.   Cardiovascular: Negative.   Gastrointestinal: Negative.   Endocrine: Negative.   Genitourinary: Negative.    Musculoskeletal: Negative.   Skin: Negative.   Neurological: Negative.   Hematological: Negative.   Psychiatric/Behavioral: Negative.     Physical Exam:  weight is 133 lb 4 oz (60.4 kg). Her oral temperature is 98.1 F (36.7 C). Her blood pressure is 123/64 and her pulse is 66. Her respiration is 18  and oxygen saturation is 100%.   Wt Readings from Last 3 Encounters:  08/12/20 133 lb 4 oz (60.4 kg)  05/20/20 138 lb (62.6 kg)  02/18/20 136 lb (61.7 kg)  To be I see no need that there is remodeling on the  Physical Exam Vitals reviewed.  HENT:     Head: Normocephalic and atraumatic.  Eyes:     Pupils: Pupils are equal, round, and reactive to light.  Cardiovascular:     Rate and Rhythm: Normal rate and regular rhythm.     Heart sounds: Normal heart sounds.  Pulmonary:     Effort: Pulmonary effort is normal.     Breath sounds: Normal breath sounds.  Abdominal:     General: Bowel sounds are normal.      Palpations: Abdomen is soft.  Musculoskeletal:        General: No tenderness or deformity. Normal range of motion.     Cervical back: Normal range of motion.  Lymphadenopathy:     Cervical: No cervical adenopathy.  Skin:    General: Skin is warm and dry.     Findings: No erythema or rash.  Neurological:     Mental Status: She is alert and oriented to person, place, and time.  Psychiatric:        Behavior: Behavior normal.        Thought Content: Thought content normal.        Judgment: Judgment normal.      Lab Results  Component Value Date   WBC 9.6 08/12/2020   HGB 14.4 08/12/2020   HCT 43.1 08/12/2020   MCV 93.7 08/12/2020   PLT 383 08/12/2020     Chemistry      Component Value Date/Time   NA 139 08/12/2020 0846   K 4.0 08/12/2020 0846   CL 101 08/12/2020 0846   CO2 32 08/12/2020 0846   BUN 17 08/12/2020 0846   CREATININE 0.77 08/12/2020 0846      Component Value Date/Time   CALCIUM 9.9 08/12/2020 0846   ALKPHOS 63 08/12/2020 0846   AST 15 08/12/2020 0846   ALT 12 08/12/2020 0846   BILITOT 0.3 08/12/2020 0846      Impression and Plan: [name] is a very charming 70 year old white female.  She certainly looks a lot younger.  She is doing quite nicely.  She is having no problems at all with the Xarelto.  She does not need any Doppler right now.  We will continue her on the Xarelto.  She will be on maintenance Xarelto for 1 year.  I will plan to see her back after Labor Day.  I think we get her through the summer.  She knows to be very proactive when she travels.  She wears compression stockings.  She drinks a lot of water.  We will switch her over to maintenance therapy in April.  Again, she knows to be very proactive when she travels.  She drinks a lot of water.  Josph Macho, MD 5/10/20229:28 AM

## 2020-08-13 ENCOUNTER — Other Ambulatory Visit: Payer: Self-pay | Admitting: Internal Medicine

## 2020-08-13 MED ORDER — DOXYCYCLINE HYCLATE 100 MG PO TABS
100.0000 mg | ORAL_TABLET | Freq: Two times a day (BID) | ORAL | 1 refills | Status: DC
  Filled 2020-08-13: qty 14, 7d supply, fill #0

## 2020-08-14 ENCOUNTER — Other Ambulatory Visit (HOSPITAL_COMMUNITY): Payer: Self-pay

## 2020-08-14 ENCOUNTER — Other Ambulatory Visit: Payer: Self-pay | Admitting: Internal Medicine

## 2020-08-14 MED ORDER — FLUCONAZOLE 100 MG PO TABS
100.0000 mg | ORAL_TABLET | Freq: Every day | ORAL | 1 refills | Status: DC | PRN
  Filled 2020-08-14: qty 6, 6d supply, fill #0

## 2020-08-15 ENCOUNTER — Encounter: Payer: Self-pay | Admitting: Hematology & Oncology

## 2020-08-25 ENCOUNTER — Other Ambulatory Visit (HOSPITAL_COMMUNITY): Payer: Self-pay

## 2020-08-25 MED FILL — Folic Acid Tab 1 MG: ORAL | 30 days supply | Qty: 60 | Fill #1 | Status: AC

## 2020-10-17 ENCOUNTER — Other Ambulatory Visit: Payer: Self-pay

## 2020-10-17 ENCOUNTER — Ambulatory Visit: Payer: PPO | Attending: Internal Medicine

## 2020-10-17 ENCOUNTER — Other Ambulatory Visit (HOSPITAL_BASED_OUTPATIENT_CLINIC_OR_DEPARTMENT_OTHER): Payer: Self-pay

## 2020-10-17 DIAGNOSIS — Z23 Encounter for immunization: Secondary | ICD-10-CM

## 2020-10-17 MED ORDER — COVID-19 MRNA VACC (MODERNA) 100 MCG/0.5ML IM SUSP
INTRAMUSCULAR | 0 refills | Status: DC
  Filled 2020-10-17: qty 0.25, 1d supply, fill #0

## 2020-10-17 NOTE — Progress Notes (Signed)
   Covid-19 Vaccination Clinic  Name:  Leah Bird    MRN: [id number] DOB: [birth year]  10/17/2020  [name] was observed post Covid-19 immunization for 15 minutes without incident. She was provided with Vaccine Information Sheet and instruction to access the V-Safe system.   [name] was instructed to call 911 with any severe reactions post vaccine: Difficulty breathing  Swelling of face and throat  A fast heartbeat  A bad rash all over body  Dizziness and weakness   Immunizations Administered     Name Date Dose VIS Date Route   Moderna Covid-19 Booster Vaccine 10/17/2020 10:04 AM 0.25 mL 01/23/2020 Intramuscular   Manufacturer: Moderna   Lot: [id number]   NDC: 45364-680-32

## 2020-10-27 ENCOUNTER — Other Ambulatory Visit: Payer: Self-pay | Admitting: Hematology & Oncology

## 2020-10-27 ENCOUNTER — Other Ambulatory Visit (HOSPITAL_COMMUNITY): Payer: Self-pay

## 2020-10-27 MED ORDER — FOLIC ACID 1 MG PO TABS
2.0000 mg | ORAL_TABLET | Freq: Every day | ORAL | 12 refills | Status: DC
  Filled 2020-10-27: qty 60, 30d supply, fill #0
  Filled 2020-12-16: qty 60, 30d supply, fill #1
  Filled 2021-02-16: qty 60, 30d supply, fill #2
  Filled 2021-03-23: qty 60, 30d supply, fill #3
  Filled 2021-05-04: qty 60, 30d supply, fill #4
  Filled 2021-06-28: qty 60, 30d supply, fill #5

## 2020-10-27 MED FILL — Rivaroxaban Tab 10 MG: ORAL | 90 days supply | Qty: 90 | Fill #0 | Status: AC

## 2020-11-13 ENCOUNTER — Telehealth: Payer: Self-pay

## 2020-11-13 DIAGNOSIS — B029 Zoster without complications: Secondary | ICD-10-CM

## 2020-11-13 MED ORDER — VALACYCLOVIR HCL 1 G PO TABS: 1000.0000 mg | ORAL_TABLET | Freq: Three times a day (TID) | ORAL | 0 refills | Status: DC

## 2020-11-13 NOTE — Telephone Encounter (Signed)
Phone call to patient regarding a new rash that is suspect for shingles. Prescription sent to patients pharmacy.

## 2020-11-13 NOTE — Telephone Encounter (Signed)
Phone call from patient stating that she is visiting family and she believes she has shingles on the left side of her face. Per patient the lesion started at her chin and stinging, painful to touch. No blisters, it is crusty. Patient wants to know if [facility]chwood, [facility] would be able to send something into [facility] in [place], [place]?  Patient informed that we will send a message to [facility].

## 2020-12-16 ENCOUNTER — Other Ambulatory Visit: Payer: Self-pay

## 2020-12-16 ENCOUNTER — Encounter: Payer: Self-pay | Admitting: Hematology & Oncology

## 2020-12-16 ENCOUNTER — Inpatient Hospital Stay: Payer: PPO | Admitting: Hematology & Oncology

## 2020-12-16 ENCOUNTER — Telehealth: Payer: Self-pay

## 2020-12-16 ENCOUNTER — Inpatient Hospital Stay: Payer: PPO | Attending: Hematology & Oncology

## 2020-12-16 ENCOUNTER — Other Ambulatory Visit (HOSPITAL_COMMUNITY): Payer: Self-pay

## 2020-12-16 VITALS — BP 134/72 | HR 72 | Temp 97.8°F | Resp 18 | Wt 125.5 lb

## 2020-12-16 DIAGNOSIS — H26493 Other secondary cataract, bilateral: Secondary | ICD-10-CM | POA: Diagnosis not present

## 2020-12-16 DIAGNOSIS — Z7901 Long term (current) use of anticoagulants: Secondary | ICD-10-CM | POA: Insufficient documentation

## 2020-12-16 DIAGNOSIS — Z86718 Personal history of other venous thrombosis and embolism: Secondary | ICD-10-CM | POA: Diagnosis not present

## 2020-12-16 DIAGNOSIS — H524 Presbyopia: Secondary | ICD-10-CM | POA: Diagnosis not present

## 2020-12-16 DIAGNOSIS — I82401 Acute embolism and thrombosis of unspecified deep veins of right lower extremity: Secondary | ICD-10-CM | POA: Diagnosis not present

## 2020-12-16 DIAGNOSIS — Z86711 Personal history of pulmonary embolism: Secondary | ICD-10-CM | POA: Insufficient documentation

## 2020-12-16 DIAGNOSIS — Z961 Presence of intraocular lens: Secondary | ICD-10-CM | POA: Diagnosis not present

## 2020-12-16 LAB — CBC WITH DIFFERENTIAL (CANCER CENTER ONLY)
Abs Immature Granulocytes: 0.02 10*3/uL (ref 0.00–0.07)
Basophils Absolute: 0.1 10*3/uL (ref 0.0–0.1)
Basophils Relative: 1 %
Eosinophils Absolute: 0.4 10*3/uL (ref 0.0–0.5)
Eosinophils Relative: 6 %
HCT: 39 % (ref 36.0–46.0)
Hemoglobin: 12.8 g/dL (ref 12.0–15.0)
Immature Granulocytes: 0 %
Lymphocytes Relative: 31 %
Lymphs Abs: 2.1 10*3/uL (ref 0.7–4.0)
MCH: 31.4 pg (ref 26.0–34.0)
MCHC: 32.8 g/dL (ref 30.0–36.0)
MCV: 95.8 fL (ref 80.0–100.0)
Monocytes Absolute: 0.5 10*3/uL (ref 0.1–1.0)
Monocytes Relative: 7 %
Neutro Abs: 3.8 10*3/uL (ref 1.7–7.7)
Neutrophils Relative %: 55 %
Platelet Count: 280 10*3/uL (ref 150–400)
RBC: 4.07 MIL/uL (ref 3.87–5.11)
RDW: 13.1 % (ref 11.5–15.5)
WBC Count: 6.9 10*3/uL (ref 4.0–10.5)
nRBC: 0 % (ref 0.0–0.2)

## 2020-12-16 LAB — CMP (CANCER CENTER ONLY)
ALT: 11 U/L (ref 0–44)
AST: 15 U/L (ref 15–41)
Albumin: 4 g/dL (ref 3.5–5.0)
Alkaline Phosphatase: 59 U/L (ref 38–126)
Anion gap: 6 (ref 5–15)
BUN: 15 mg/dL (ref 8–23)
CO2: 33 mmol/L — ABNORMAL HIGH (ref 22–32)
Calcium: 9.2 mg/dL (ref 8.9–10.3)
Chloride: 103 mmol/L (ref 98–111)
Creatinine: 0.73 mg/dL (ref 0.44–1.00)
GFR, Estimated: >60 mL/min (ref >60)
Glucose, Bld: 97 mg/dL (ref 70–99)
Potassium: 4.1 mmol/L (ref 3.5–5.1)
Sodium: 142 mmol/L (ref 135–145)
Total Bilirubin: 0.4 mg/dL (ref 0.3–1.2)
Total Protein: 6.7 g/dL (ref 6.5–8.1)

## 2020-12-16 NOTE — Progress Notes (Signed)
Hematology and Oncology Follow Up Visit  Leah Bird [id number] 1950/11/06 70 y.o. 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  Transient MTHFR -heterozygous  Current Therapy:   Xarelto 20 mg p.o. daily -complete 1 year in April 2022 Xarelto 10 mg po q day -- maintenance -  Start on 07/2020 Folic acid 2 mg p.o. daily     Interim History:  Leah Bird is back for follow-up.  She is doing fantastic.  Her only complaint is that she says she feels a little bit off balance.  This is been going on for couple weeks.  I am not sure as to why this would happen.  There is no vertigo.  She has had no ringing in the ears.  There is no nausea or vomiting.  Her family from out in [place] will be coming in to [place] for a wedding the end of September.  She is looking forward to this.  She was out in [place] back in August.  She visits her family and her new grandson.  She has had no problem with her legs.  There is no swelling in the legs.  She has had no bleeding.  She is on Xarelto and doing well.  She is on the low-dose of maintenance Xarelto.  She is also is on folic acid.  She has had no change in bowel or bladder habits.  There is been no chest wall pain.  She has had no cough.  Overall, her performance status is ECOG 0.     Medications:  Current Outpatient Medications:    b complex vitamins tablet, Take 1 tablet by mouth daily., Disp: 100 tablet, Rfl: 3   Calcium Citrate-Vitamin D (CALCIUM CITRATE +D PO), Take 1 tablet by mouth daily. 600 mg of calcium citrate and 400 mg of D3, Disp: , Rfl:    Cholecalciferol (VITAMIN D3) 50 MCG (2000 UT) capsule, Take 2 capsules (4,000 Units total) by mouth daily. (Patient taking differently: Take 2,000 Units by mouth daily.), Disp: 100 capsule, Rfl: 3   COVID-19 mRNA vaccine, Moderna, 100 MCG/0.5ML injection, Inject into the  muscle., Disp: 0.25 mL, Rfl: 0   FLUAD QUADRIVALENT 0.5 ML injection, , Disp: , Rfl:    folic acid (FOLVITE) 1 MG tablet, Take 2 tablets (2 mg total) by mouth daily., Disp: 60 tablet, Rfl: 12   HYDROcodone-acetaminophen (NORCO/VICODIN) 5-325 MG tablet, Take 1 tablet by mouth every 6 (six) hours as needed for moderate pain., Disp: 30 tablet, Rfl: 0   rivaroxaban (XARELTO) 10 MG TABS tablet, TAKE 1 TABLET (20 MG TOTAL) BY MOUTH DAILY., Disp: 90 tablet, Rfl: 3   rivaroxaban (XARELTO) 20 MG TABS tablet, Xarelto 20 mg tablet  TAKE 1 TABLET (20 MG TOTAL) BY MOUTH DAILY., Disp: , Rfl:   Allergies:  Allergies  Allergen Reactions   Succinylcholine Shortness Of Breath    Respiratory failure has pseudocholinesterase defiency   Other     Pseudocholinesterace- prolonged anesthesia. Was on life support after having    Past Medical History, Surgical history, Social history, and Family History were reviewed and updated.  Review of Systems: Review of Systems  Constitutional: Negative.   HENT:  Negative.    Eyes: Negative.   Respiratory: Negative.    Cardiovascular: Negative.   Gastrointestinal: Negative.   Endocrine: Negative.   Genitourinary: Negative.    Musculoskeletal: Negative.   Skin: Negative.  Neurological: Negative.   Hematological: Negative.   Psychiatric/Behavioral: Negative.     Physical Exam:  weight is 125 lb 8 oz (56.9 kg). Her oral temperature is 97.8 F (36.6 C). Her blood pressure is 134/72 and her pulse is 72. Her respiration is 18 and oxygen saturation is 100%.   Wt Readings from Last 3 Encounters:  12/16/20 125 lb 8 oz (56.9 kg)  08/12/20 133 lb 4 oz (60.4 kg)  05/20/20 138 lb (62.6 kg)  To be I see no need that there is remodeling on the  Physical Exam Vitals reviewed.  HENT:     Head: Normocephalic and atraumatic.  Eyes:     Pupils: Pupils are equal, round, and reactive to light.  Cardiovascular:     Rate and Rhythm: Normal rate and regular rhythm.     Heart  sounds: Normal heart sounds.  Pulmonary:     Effort: Pulmonary effort is normal.     Breath sounds: Normal breath sounds.  Abdominal:     General: Bowel sounds are normal.     Palpations: Abdomen is soft.  Musculoskeletal:        General: No tenderness or deformity. Normal range of motion.     Cervical back: Normal range of motion.  Lymphadenopathy:     Cervical: No cervical adenopathy.  Skin:    General: Skin is warm and dry.     Findings: No erythema or rash.  Neurological:     Mental Status: She is alert and oriented to person, place, and time.  Psychiatric:        Behavior: Behavior normal.        Thought Content: Thought content normal.        Judgment: Judgment normal.     Lab Results  Component Value Date   WBC 6.9 12/16/2020   HGB 12.8 12/16/2020   HCT 39.0 12/16/2020   MCV 95.8 12/16/2020   PLT 280 12/16/2020     Chemistry      Component Value Date/Time   NA 142 12/16/2020 0843   K 4.1 12/16/2020 0843   CL 103 12/16/2020 0843   CO2 33 (H) 12/16/2020 0843   BUN 15 12/16/2020 0843   CREATININE 0.73 12/16/2020 0843      Component Value Date/Time   CALCIUM 9.2 12/16/2020 0843   ALKPHOS 59 12/16/2020 0843   AST 15 12/16/2020 0843   ALT 11 12/16/2020 0843   BILITOT 0.4 12/16/2020 0843      Impression and Plan: Leah Bird is a very charming 70 year old white female.  She certainly looks a lot younger.  She is doing quite nicely.  She is having no problems at all with the Xarelto.  She does not need any Doppler right now.  We will continue her on the Xarelto.  She will be on maintenance Xarelto for 1 year.  Again, I am not sure why she has this issue not being on balance.  Neurologically I cannot find anything on exam.  Her electrolytes all look okay.  At this point, I think we can probably get her back after the holidays.  We will plan to get her back in January.  If she has any issues that we can help with, we will be 1 happy to help out.   Josph Macho, MD 9/13/20229:19 AM

## 2020-12-16 NOTE — Telephone Encounter (Signed)
Appts made and printed for pt per 9/13/ 22 los  Maveryk Renstrom 

## 2020-12-18 DIAGNOSIS — M25512 Pain in left shoulder: Secondary | ICD-10-CM | POA: Diagnosis not present

## 2020-12-18 DIAGNOSIS — M7542 Impingement syndrome of left shoulder: Secondary | ICD-10-CM | POA: Diagnosis not present

## 2020-12-21 DIAGNOSIS — M7542 Impingement syndrome of left shoulder: Secondary | ICD-10-CM | POA: Insufficient documentation

## 2020-12-30 NOTE — Telephone Encounter (Signed)
This encounter was created in error - please disregard.

## 2021-01-16 ENCOUNTER — Other Ambulatory Visit (HOSPITAL_COMMUNITY): Payer: Self-pay

## 2021-01-16 MED FILL — Rivaroxaban Tab 10 MG: ORAL | 90 days supply | Qty: 90 | Fill #1 | Status: AC

## 2021-02-02 ENCOUNTER — Other Ambulatory Visit: Payer: Self-pay | Admitting: Internal Medicine

## 2021-02-02 DIAGNOSIS — Z1231 Encounter for screening mammogram for malignant neoplasm of breast: Secondary | ICD-10-CM

## 2021-02-16 ENCOUNTER — Other Ambulatory Visit (HOSPITAL_COMMUNITY): Payer: Self-pay

## 2021-03-09 ENCOUNTER — Ambulatory Visit: Payer: PPO

## 2021-03-09 ENCOUNTER — Ambulatory Visit
Admission: RE | Admit: 2021-03-09 | Discharge: 2021-03-09 | Disposition: A | Discharge status: Home / Self Care | Payer: PPO | Source: Ambulatory Visit | Attending: Internal Medicine | Admitting: Internal Medicine

## 2021-03-09 DIAGNOSIS — Z1231 Encounter for screening mammogram for malignant neoplasm of breast: Secondary | ICD-10-CM | POA: Diagnosis not present

## 2021-03-23 ENCOUNTER — Other Ambulatory Visit (HOSPITAL_COMMUNITY): Payer: Self-pay

## 2021-04-29 ENCOUNTER — Other Ambulatory Visit: Payer: PPO

## 2021-04-29 ENCOUNTER — Ambulatory Visit: Payer: PPO | Admitting: Hematology & Oncology

## 2021-04-30 ENCOUNTER — Inpatient Hospital Stay: Payer: PPO | Attending: Hematology & Oncology

## 2021-04-30 ENCOUNTER — Inpatient Hospital Stay: Payer: PPO | Admitting: Hematology & Oncology

## 2021-04-30 ENCOUNTER — Encounter: Payer: Self-pay | Admitting: Hematology & Oncology

## 2021-04-30 ENCOUNTER — Other Ambulatory Visit: Payer: Self-pay

## 2021-04-30 VITALS — BP 119/70 | HR 61 | Temp 98.2°F | Resp 16 | Ht 63.0 in | Wt 119.1 lb

## 2021-04-30 DIAGNOSIS — Z79899 Other long term (current) drug therapy: Secondary | ICD-10-CM | POA: Insufficient documentation

## 2021-04-30 DIAGNOSIS — Z86711 Personal history of pulmonary embolism: Secondary | ICD-10-CM | POA: Insufficient documentation

## 2021-04-30 DIAGNOSIS — I82402 Acute embolism and thrombosis of unspecified deep veins of left lower extremity: Secondary | ICD-10-CM | POA: Diagnosis not present

## 2021-04-30 DIAGNOSIS — Z7901 Long term (current) use of anticoagulants: Secondary | ICD-10-CM | POA: Diagnosis not present

## 2021-04-30 DIAGNOSIS — Z86718 Personal history of other venous thrombosis and embolism: Secondary | ICD-10-CM | POA: Diagnosis present

## 2021-04-30 DIAGNOSIS — I82401 Acute embolism and thrombosis of unspecified deep veins of right lower extremity: Secondary | ICD-10-CM

## 2021-04-30 LAB — CBC WITH DIFFERENTIAL (CANCER CENTER ONLY)
Abs Immature Granulocytes: 0.01 10*3/uL (ref 0.00–0.07)
Basophils Absolute: 0.1 10*3/uL (ref 0.0–0.1)
Basophils Relative: 1 %
Eosinophils Absolute: 0.3 10*3/uL (ref 0.0–0.5)
Eosinophils Relative: 4 %
HCT: 41 % (ref 36.0–46.0)
Hemoglobin: 13.4 g/dL (ref 12.0–15.0)
Immature Granulocytes: 0 %
Lymphocytes Relative: 23 %
Lymphs Abs: 1.9 10*3/uL (ref 0.7–4.0)
MCH: 31.4 pg (ref 26.0–34.0)
MCHC: 32.7 g/dL (ref 30.0–36.0)
MCV: 96 fL (ref 80.0–100.0)
Monocytes Absolute: 0.5 10*3/uL (ref 0.1–1.0)
Monocytes Relative: 7 %
Neutro Abs: 5.3 10*3/uL (ref 1.7–7.7)
Neutrophils Relative %: 65 %
Platelet Count: 327 10*3/uL (ref 150–400)
RBC: 4.27 MIL/uL (ref 3.87–5.11)
RDW: 13.2 % (ref 11.5–15.5)
WBC Count: 8.1 10*3/uL (ref 4.0–10.5)
nRBC: 0 % (ref 0.0–0.2)

## 2021-04-30 LAB — CMP (CANCER CENTER ONLY)
ALT: 17 U/L (ref 0–44)
AST: 17 U/L (ref 15–41)
Albumin: 4.3 g/dL (ref 3.5–5.0)
Alkaline Phosphatase: 71 U/L (ref 38–126)
Anion gap: 7 (ref 5–15)
BUN: 16 mg/dL (ref 8–23)
CO2: 32 mmol/L (ref 22–32)
Calcium: 10.1 mg/dL (ref 8.9–10.3)
Chloride: 103 mmol/L (ref 98–111)
Creatinine: 0.75 mg/dL (ref 0.44–1.00)
GFR, Estimated: >60 mL/min (ref >60)
Glucose, Bld: 102 mg/dL — ABNORMAL HIGH (ref 70–99)
Potassium: 4.8 mmol/L (ref 3.5–5.1)
Sodium: 142 mmol/L (ref 135–145)
Total Bilirubin: 0.3 mg/dL (ref 0.3–1.2)
Total Protein: 7.4 g/dL (ref 6.5–8.1)

## 2021-04-30 LAB — D-DIMER, QUANTITATIVE: D-Dimer, Quant: 0.4 ug/mL-FEU (ref 0.00–0.50)

## 2021-04-30 NOTE — Progress Notes (Signed)
Hematology and Oncology Follow Up Visit  Leah Bird [id number] 09/27/1950 71 y.o. 04/30/2021   Principle Diagnosis:  Left pulmonary embolism Left leg thromboembolic disease femoral vein down to peroneal vein Lupus anticoagulant positive - ?  Transient MTHFR -heterozygous  Current Therapy:   Xarelto 20 mg p.o. daily -complete 1 year in April 2022 Xarelto 10 mg po q day -- maintenance -  Start on 07/2020 Folic acid 2 mg p.o. daily     Interim History:  Leah Bird is back for follow-up.  She is doing fantastic.  Her only complaint is that she says she feels a little bit off balance.  This is been going on for couple weeks.  I am not sure as to why this would happen.  There is no vertigo.  She has had no ringing in the ears.  There is no nausea or vomiting.  Her family from out in [place] will be coming in to [place] for a wedding the end of September.  She is looking forward to this.  She was out in [place] back in August.  She visits her family and her new grandson.  She has had no problem with her legs.  There is no swelling in the legs.  She has had no bleeding.  She is on Xarelto and doing well.  She is on the low-dose of maintenance Xarelto.  She is also is on folic acid.  She has had no change in bowel or bladder habits.  There is been no chest wall pain.  She has had no cough.  Overall, her performance status is ECOG 0.     Medications:  Current Outpatient Medications:    b complex vitamins tablet, Take 1 tablet by mouth daily., Disp: 100 tablet, Rfl: 3   Calcium Citrate-Vitamin D (CALCIUM CITRATE +D PO), Take 1 tablet by mouth daily. 600 mg of calcium citrate and 400 mg of D3, Disp: , Rfl:    Cholecalciferol (VITAMIN D3) 50 MCG (2000 UT) capsule, Take 2 capsules (4,000 Units total) by mouth daily. (Patient taking differently: Take 2,000 Units by mouth daily.), Disp: 100 capsule, Rfl: 3   COVID-19 mRNA vaccine, Moderna, 100 MCG/0.5ML injection, Inject into the  muscle., Disp: 0.25 mL, Rfl: 0   FLUAD QUADRIVALENT 0.5 ML injection, , Disp: , Rfl:    folic acid (FOLVITE) 1 MG tablet, Take 2 tablets (2 mg total) by mouth daily., Disp: 60 tablet, Rfl: 12   HYDROcodone-acetaminophen (NORCO/VICODIN) 5-325 MG tablet, Take 1 tablet by mouth every 6 (six) hours as needed for moderate pain., Disp: 30 tablet, Rfl: 0   rivaroxaban (XARELTO) 10 MG TABS tablet, TAKE 1 TABLET (20 MG TOTAL) BY MOUTH DAILY., Disp: 90 tablet, Rfl: 3   rivaroxaban (XARELTO) 20 MG TABS tablet, Xarelto 20 mg tablet  TAKE 1 TABLET (20 MG TOTAL) BY MOUTH DAILY. (Patient not taking: Reported on 04/30/2021), Disp: , Rfl:   Allergies:  Allergies  Allergen Reactions   Succinylcholine Shortness Of Breath    Respiratory failure has pseudocholinesterase defiency   Other     Pseudocholinesterace- prolonged anesthesia. Was on life support after having    Past Medical History, Surgical history, Social history, and Family History were reviewed and updated.  Review of Systems: Review of Systems  Constitutional: Negative.   HENT:  Negative.    Eyes: Negative.   Respiratory: Negative.    Cardiovascular: Negative.   Gastrointestinal: Negative.   Endocrine: Negative.   Genitourinary: Negative.    Musculoskeletal: Negative.  Skin: Negative.   Neurological: Negative.   Hematological: Negative.   Psychiatric/Behavioral: Negative.     Physical Exam:  height is 5\' 3"  (1.6 m) and weight is 119 lb 1.9 oz (54 kg). Her oral temperature is 98.2 F (36.8 C). Her blood pressure is 119/70 and her pulse is 61. Her respiration is 16 and oxygen saturation is 98%.   Wt Readings from Last 3 Encounters:  04/30/21 119 lb 1.9 oz (54 kg)  12/16/20 125 lb 8 oz (56.9 kg)  08/12/20 133 lb 4 oz (60.4 kg)  To be I see no need that there is remodeling on the  Physical Exam Vitals reviewed.  HENT:     Head: Normocephalic and atraumatic.  Eyes:     Pupils: Pupils are equal, round, and reactive to light.   Cardiovascular:     Rate and Rhythm: Normal rate and regular rhythm.     Heart sounds: Normal heart sounds.  Pulmonary:     Effort: Pulmonary effort is normal.     Breath sounds: Normal breath sounds.  Abdominal:     General: Bowel sounds are normal.     Palpations: Abdomen is soft.  Musculoskeletal:        General: No tenderness or deformity. Normal range of motion.     Cervical back: Normal range of motion.  Lymphadenopathy:     Cervical: No cervical adenopathy.  Skin:    General: Skin is warm and dry.     Findings: No erythema or rash.  Neurological:     Mental Status: She is alert and oriented to person, place, and time.  Psychiatric:        Behavior: Behavior normal.        Thought Content: Thought content normal.        Judgment: Judgment normal.     Lab Results  Component Value Date   WBC 8.1 04/30/2021   HGB 13.4 04/30/2021   HCT 41.0 04/30/2021   MCV 96.0 04/30/2021   PLT 327 04/30/2021     Chemistry      Component Value Date/Time   NA 142 04/30/2021 0903   K 4.8 04/30/2021 0903   CL 103 04/30/2021 0903   CO2 32 04/30/2021 0903   BUN 16 04/30/2021 0903   CREATININE 0.75 04/30/2021 0903      Component Value Date/Time   CALCIUM 10.1 04/30/2021 0903   ALKPHOS 71 04/30/2021 0903   AST 17 04/30/2021 0903   ALT 17 04/30/2021 0903   BILITOT 0.3 04/30/2021 0903      Impression and Plan: Leah Bird is a very charming 71 year old white female.  She certainly looks a lot younger.  She had a DVT in the left leg.  She had a pulmonary embolism.  The last time we checked her lupus anticoagulant it was negative.  Will be a year in April that she has been on maintenance Xarelto.  As such, we will recheck a Doppler of her left leg see how that looks.  No she travels out to [facility] quite a bit.  We may have to think about keeping her on some kind of low-dose anticoagulation since she does travel quite a bit.  I want a make sure that she is safe when she does  travel.  [facility]   Marland Kitchen, MD 1/26/202310:03 AM

## 2021-05-04 ENCOUNTER — Other Ambulatory Visit (HOSPITAL_COMMUNITY): Payer: Self-pay

## 2021-05-04 MED FILL — Rivaroxaban Tab 10 MG: ORAL | 90 days supply | Qty: 90 | Fill #2 | Status: AC

## 2021-06-29 ENCOUNTER — Other Ambulatory Visit (HOSPITAL_COMMUNITY): Payer: Self-pay

## 2021-07-29 ENCOUNTER — Inpatient Hospital Stay: Payer: PPO | Attending: Hematology & Oncology

## 2021-07-29 ENCOUNTER — Inpatient Hospital Stay: Payer: PPO | Admitting: Hematology & Oncology

## 2021-07-29 ENCOUNTER — Other Ambulatory Visit: Payer: Self-pay

## 2021-07-29 ENCOUNTER — Other Ambulatory Visit: Payer: Self-pay | Admitting: Hematology & Oncology

## 2021-07-29 ENCOUNTER — Ambulatory Visit (HOSPITAL_BASED_OUTPATIENT_CLINIC_OR_DEPARTMENT_OTHER)
Admission: RE | Admit: 2021-07-29 | Discharge: 2021-07-29 | Disposition: A | Discharge status: Home / Self Care | Payer: PPO | Source: Ambulatory Visit | Attending: Hematology & Oncology | Admitting: Hematology & Oncology

## 2021-07-29 ENCOUNTER — Encounter: Payer: Self-pay | Admitting: Hematology & Oncology

## 2021-07-29 ENCOUNTER — Other Ambulatory Visit (HOSPITAL_COMMUNITY): Payer: Self-pay

## 2021-07-29 VITALS — BP 117/71 | HR 55 | Temp 98.1°F | Resp 16 | Wt 118.0 lb

## 2021-07-29 DIAGNOSIS — Z86711 Personal history of pulmonary embolism: Secondary | ICD-10-CM | POA: Insufficient documentation

## 2021-07-29 DIAGNOSIS — I82402 Acute embolism and thrombosis of unspecified deep veins of left lower extremity: Secondary | ICD-10-CM | POA: Insufficient documentation

## 2021-07-29 DIAGNOSIS — Z7901 Long term (current) use of anticoagulants: Secondary | ICD-10-CM | POA: Insufficient documentation

## 2021-07-29 DIAGNOSIS — Z86718 Personal history of other venous thrombosis and embolism: Secondary | ICD-10-CM | POA: Diagnosis present

## 2021-07-29 LAB — CMP (CANCER CENTER ONLY)
ALT: 13 U/L (ref 0–44)
AST: 14 U/L — ABNORMAL LOW (ref 15–41)
Albumin: 4 g/dL (ref 3.5–5.0)
Alkaline Phosphatase: 63 U/L (ref 38–126)
Anion gap: 6 (ref 5–15)
BUN: 12 mg/dL (ref 8–23)
CO2: 31 mmol/L (ref 22–32)
Calcium: 9.2 mg/dL (ref 8.9–10.3)
Chloride: 103 mmol/L (ref 98–111)
Creatinine: 0.8 mg/dL (ref 0.44–1.00)
GFR, Estimated: >60 mL/min (ref >60)
Glucose, Bld: 76 mg/dL (ref 70–99)
Potassium: 4.2 mmol/L (ref 3.5–5.1)
Sodium: 140 mmol/L (ref 135–145)
Total Bilirubin: 0.7 mg/dL (ref 0.3–1.2)
Total Protein: 7.1 g/dL (ref 6.5–8.1)

## 2021-07-29 LAB — CBC WITH DIFFERENTIAL (CANCER CENTER ONLY)
Abs Immature Granulocytes: 0.04 10*3/uL (ref 0.00–0.07)
Basophils Absolute: 0.1 10*3/uL (ref 0.0–0.1)
Basophils Relative: 0 %
Eosinophils Absolute: 0.3 10*3/uL (ref 0.0–0.5)
Eosinophils Relative: 2 %
HCT: 40 % (ref 36.0–46.0)
Hemoglobin: 13.1 g/dL (ref 12.0–15.0)
Immature Granulocytes: 0 %
Lymphocytes Relative: 12 %
Lymphs Abs: 1.6 10*3/uL (ref 0.7–4.0)
MCH: 31.2 pg (ref 26.0–34.0)
MCHC: 32.8 g/dL (ref 30.0–36.0)
MCV: 95.2 fL (ref 80.0–100.0)
Monocytes Absolute: 0.9 10*3/uL (ref 0.1–1.0)
Monocytes Relative: 7 %
Neutro Abs: 10.3 10*3/uL — ABNORMAL HIGH (ref 1.7–7.7)
Neutrophils Relative %: 79 %
Platelet Count: 269 10*3/uL (ref 150–400)
RBC: 4.2 MIL/uL (ref 3.87–5.11)
RDW: 13.2 % (ref 11.5–15.5)
WBC Count: 13.3 10*3/uL — ABNORMAL HIGH (ref 4.0–10.5)
nRBC: 0 % (ref 0.0–0.2)

## 2021-07-29 LAB — D-DIMER, QUANTITATIVE: D-Dimer, Quant: 0.37 ug/mL-FEU (ref 0.00–0.50)

## 2021-07-29 MED ORDER — FOLIC ACID 1 MG PO TABS
2.0000 mg | ORAL_TABLET | Freq: Every day | ORAL | 6 refills | Status: DC
  Filled 2021-07-29: qty 180, 90d supply, fill #0

## 2021-07-29 MED ORDER — RIVAROXABAN 10 MG PO TABS
ORAL_TABLET | Freq: Every day | ORAL | 3 refills | Status: DC
  Filled 2021-07-29: qty 90, fill #0

## 2021-07-29 MED ORDER — RIVAROXABAN 10 MG PO TABS
10.0000 mg | ORAL_TABLET | Freq: Every day | ORAL | 3 refills | Status: DC
Start: 2021-07-29 — End: 2022-06-08
  Filled 2021-07-29: qty 90, 90d supply, fill #0
  Filled 2021-09-17: qty 90, 90d supply, fill #1
  Filled 2021-09-18: qty 30, 30d supply, fill #1
  Filled 2021-11-12: qty 30, 30d supply, fill #2
  Filled 2021-11-12: qty 90, 90d supply, fill #2
  Filled 2022-03-05: qty 90, 90d supply, fill #3

## 2021-07-29 NOTE — Progress Notes (Signed)
?Hematology and Oncology Follow Up Visit ? ?Leah Bird ?[id number] ?December 04, 1950 71 y.o. ?07/29/2021 ? ? ?Principle Diagnosis:  ?Left pulmonary embolism ?Left leg thromboembolic disease femoral vein down to peroneal vein ?Lupus anticoagulant positive - ?  Transient ?MTHFR -heterozygous ? ?Current Therapy:   ?Xarelto 20 mg p.o. daily -complete 1 year in April 2022 ?Xarelto 10 mg po q day -- maintenance -  Start on 07/2020 ?Folic acid 2 mg p.o. daily ?    ?Interim History:  Leah Bird is back for follow-up.  The big news is another daughter is going to have twins.  She also lives in [place].  The due date is June 23.  Will be a boy and a girl. ? ?Her other daughter, who lives in [place], is doing well.  Her grandson is doing nicely. ? ?As always, she will be going out to [place].  Should be out there for a while.  She is leaving [place].  We did do a Doppler test.  This did show a chronic nonocclusive thrombus in the left popliteal vein.  I think she will always have this. ? ?She is on Xarelto 10 mg.  I think she probably needs to stay on this given that she travels quite a bit.  I just worry that with her traveling, that she would be at a higher risk for thromboembolic disease if she is off anticoagulation. ? ?She has had no bleeding.  She has had no bruising.  She has had no nausea or vomiting.  There is been no cough or chest wall pain.  She has had no leg swelling. ? ?She stays incredibly active.  She exercises. ? ?Overall, I would say performance status is probably ECOG 1.    ? ?Medications:  ?Current Outpatient Medications:  ?  B Complex Vitamins (VITAMIN B COMPLEX 100 IJ), vitamin B complex, Disp: , Rfl:  ?  b complex vitamins tablet, Take 1 tablet by mouth daily., Disp: 100 tablet, Rfl: 3 ?  Calcium Citrate-Vitamin D (CALCIUM CITRATE +D PO), Take 1 tablet by mouth daily. 600 mg of calcium citrate and 400 mg of D3, Disp: , Rfl:  ?  Cholecalciferol (VITAMIN D3) 50 MCG (2000 UT) capsule, Take 2  capsules (4,000 Units total) by mouth daily. (Patient taking differently: Take 2,000 Units by mouth daily.), Disp: 100 capsule, Rfl: 3 ?  COVID-19 mRNA vaccine, Moderna, 100 MCG/0.5ML injection, Inject into the muscle., Disp: 0.25 mL, Rfl: 0 ?  FLUAD QUADRIVALENT 0.5 ML injection, , Disp: , Rfl:  ?  folic acid (FOLVITE) 1 MG tablet, Take 2 tablets (2 mg total) by mouth daily., Disp: 60 tablet, Rfl: 12 ?  HYDROcodone-acetaminophen (NORCO/VICODIN) 5-325 MG tablet, Take 1 tablet by mouth every 6 (six) hours as needed for moderate pain., Disp: 30 tablet, Rfl: 0 ?  rivaroxaban (XARELTO) 10 MG TABS tablet, TAKE 1 TABLET (20 MG TOTAL) BY MOUTH DAILY., Disp: 90 tablet, Rfl: 3 ? ?Allergies:  ?Allergies  ?Allergen Reactions  ? Succinylcholine Shortness Of Breath  ?  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 Other   ?  Pseudocholinesterace- prolonged anesthesia. Was on life support after having  ? ? ?Past Medical History, Surgical history, Social history, and Family History were reviewed and updated. ? ?Review of Systems: ?Review of Systems  ?Constitutional: Negative.   ?HENT:  Negative.    ?Eyes: Negative.   ?Respiratory: Negative.    ?Cardiovascular: Negative.   ?Gastrointestinal: Negative.   ?Endocrine: Negative.   ?Genitourinary: Negative.    ?  Musculoskeletal: Negative.   ?Skin: Negative.   ?Neurological: Negative.   ?Hematological: Negative.   ?Psychiatric/Behavioral: Negative.    ? ?Physical Exam: ? weight is 118 lb (53.5 kg). Her oral temperature is 98.1 ?F (36.7 ?C). Her blood pressure is 117/71 and her pulse is 55 (abnormal). Her respiration is 16 and oxygen saturation is 97%.  ? ?Wt Readings from Last 3 Encounters:  ?07/29/21 118 lb (53.5 kg)  ?04/30/21 119 lb 1.9 oz (54 kg)  ?12/16/20 125 lb 8 oz (56.9 kg)  ?To be I see no need that there is remodeling on the ? ?Physical Exam ?Vitals reviewed.  ?HENT:  ?   Head: Normocephalic and atraumatic.  ?Eyes:  ?   Pupils: Pupils are equal, round, and reactive  to light.  ?Cardiovascular:  ?   Rate and Rhythm: Normal rate and regular rhythm.  ?   Heart sounds: Normal heart sounds.  ?Pulmonary:  ?   Effort: Pulmonary effort is normal.  ?   Breath sounds: Normal breath sounds.  ?Abdominal:  ?   General: Bowel sounds are normal.  ?   Palpations: Abdomen is soft.  ?Musculoskeletal:     ?   General: No tenderness or deformity. Normal range of motion.  ?   Cervical back: Normal range of motion.  ?Lymphadenopathy:  ?   Cervical: No cervical adenopathy.  ?Skin: ?   General: Skin is warm and dry.  ?   Findings: No erythema or rash.  ?Neurological:  ?   Mental Status: She is alert and oriented to person, place, and time.  ?Psychiatric:     ?   Behavior: Behavior normal.     ?   Thought Content: Thought content normal.     ?   Judgment: Judgment normal.  ? ? ? ?Lab Results  ?Component Value Date  ? WBC 13.3 (H) 07/29/2021  ? HGB 13.1 07/29/2021  ? HCT 40.0 07/29/2021  ? MCV 95.2 07/29/2021  ? PLT 269 07/29/2021  ? ?  Chemistry   ?   ?Component Value Date/Time  ? NA 140 07/29/2021 0825  ? K 4.2 07/29/2021 0825  ? CL 103 07/29/2021 0825  ? CO2 31 07/29/2021 0825  ? BUN 12 07/29/2021 0825  ? CREATININE 0.80 07/29/2021 0825  ?    ?Component Value Date/Time  ? CALCIUM 9.2 07/29/2021 0825  ? ALKPHOS 63 07/29/2021 0825  ? AST 14 (L) 07/29/2021 0825  ? ALT 13 07/29/2021 0825  ? BILITOT 0.7 07/29/2021 0825  ?  ? ? ?Impression and Plan: ?Leah Bird is a very charming 71 year old white female.  She certainly looks a lot younger.  She had a DVT in the left leg.  She had a pulmonary embolism.  The last time we checked her lupus anticoagulant it was negative. ? ?I think that she will always have this chronic thrombus.  Again it is nonocclusive. ? ?Again she will stay on the low-dose Xarelto.  I do think this would be a good idea given that she travels quite a bit. ? ?I think we can now get her back after Labor Day. ? ? ? ?Volanda Napoleon, MD ?4/26/20239:03 AM ?

## 2021-07-29 NOTE — Addendum Note (Signed)
Addended by: Arlan Organ R on: 07/29/2021 01:39 PM ? ? Modules accepted: Orders ? ?

## 2021-07-30 ENCOUNTER — Other Ambulatory Visit (HOSPITAL_COMMUNITY): Payer: Self-pay

## 2021-08-24 ENCOUNTER — Telehealth: Payer: Self-pay | Admitting: Physician Assistant

## 2021-08-24 ENCOUNTER — Other Ambulatory Visit (HOSPITAL_COMMUNITY): Payer: Self-pay

## 2021-08-24 MED ORDER — VALACYCLOVIR HCL 1 G PO TABS
1000.0000 mg | ORAL_TABLET | Freq: Three times a day (TID) | ORAL | 1 refills | Status: DC
  Filled 2021-08-24: qty 30, 10d supply, fill #0

## 2021-08-24 MED ORDER — PREDNISONE 20 MG PO TABS
ORAL_TABLET | ORAL | 0 refills | Status: DC
Start: 2021-08-24 — End: 2021-12-17
  Filled 2021-08-24: qty 30, 40d supply, fill #0

## 2021-08-24 NOTE — Telephone Encounter (Signed)
[facility] left message on office voice mail saying that they had received a prescription for Prednisone for Leah Bird.  For insurance to be filed they need the amount and frequency of the medication.

## 2021-08-24 NOTE — Telephone Encounter (Signed)
Phone call with small pink papules. Painful and pruritic. She is going on a trip to [place] so prescribed Valtrex and prednisone for potential Herpes Zoster outbreak.

## 2021-09-17 ENCOUNTER — Encounter: Payer: Self-pay | Admitting: Hematology & Oncology

## 2021-09-17 ENCOUNTER — Other Ambulatory Visit (HOSPITAL_COMMUNITY): Payer: Self-pay

## 2021-09-18 ENCOUNTER — Other Ambulatory Visit (HOSPITAL_COMMUNITY): Payer: Self-pay

## 2021-11-12 ENCOUNTER — Other Ambulatory Visit (HOSPITAL_COMMUNITY): Payer: Self-pay

## 2021-12-17 ENCOUNTER — Encounter: Payer: Self-pay | Admitting: Hematology & Oncology

## 2021-12-17 ENCOUNTER — Inpatient Hospital Stay: Payer: PPO | Admitting: Hematology & Oncology

## 2021-12-17 ENCOUNTER — Other Ambulatory Visit: Payer: Self-pay

## 2021-12-17 ENCOUNTER — Inpatient Hospital Stay: Payer: PPO | Attending: Hematology & Oncology

## 2021-12-17 VITALS — BP 108/67 | HR 65 | Temp 98.7°F | Resp 18 | Ht 63.0 in | Wt 120.0 lb

## 2021-12-17 DIAGNOSIS — Z86718 Personal history of other venous thrombosis and embolism: Secondary | ICD-10-CM | POA: Diagnosis present

## 2021-12-17 DIAGNOSIS — I82401 Acute embolism and thrombosis of unspecified deep veins of right lower extremity: Secondary | ICD-10-CM | POA: Diagnosis not present

## 2021-12-17 DIAGNOSIS — Z7901 Long term (current) use of anticoagulants: Secondary | ICD-10-CM | POA: Diagnosis not present

## 2021-12-17 DIAGNOSIS — I82402 Acute embolism and thrombosis of unspecified deep veins of left lower extremity: Secondary | ICD-10-CM

## 2021-12-17 DIAGNOSIS — Z7982 Long term (current) use of aspirin: Secondary | ICD-10-CM | POA: Insufficient documentation

## 2021-12-17 DIAGNOSIS — Z86711 Personal history of pulmonary embolism: Secondary | ICD-10-CM | POA: Insufficient documentation

## 2021-12-17 LAB — CBC WITH DIFFERENTIAL (CANCER CENTER ONLY)
Abs Immature Granulocytes: 0.02 10*3/uL (ref 0.00–0.07)
Basophils Absolute: 0.1 10*3/uL (ref 0.0–0.1)
Basophils Relative: 1 %
Eosinophils Absolute: 0.3 10*3/uL (ref 0.0–0.5)
Eosinophils Relative: 3 %
HCT: 40.1 % (ref 36.0–46.0)
Hemoglobin: 13.1 g/dL (ref 12.0–15.0)
Immature Granulocytes: 0 %
Lymphocytes Relative: 25 %
Lymphs Abs: 2 10*3/uL (ref 0.7–4.0)
MCH: 31.3 pg (ref 26.0–34.0)
MCHC: 32.7 g/dL (ref 30.0–36.0)
MCV: 95.7 fL (ref 80.0–100.0)
Monocytes Absolute: 0.6 10*3/uL (ref 0.1–1.0)
Monocytes Relative: 7 %
Neutro Abs: 5.2 10*3/uL (ref 1.7–7.7)
Neutrophils Relative %: 64 %
Platelet Count: 299 10*3/uL (ref 150–400)
RBC: 4.19 MIL/uL (ref 3.87–5.11)
RDW: 13.2 % (ref 11.5–15.5)
WBC Count: 8.2 10*3/uL (ref 4.0–10.5)
nRBC: 0 % (ref 0.0–0.2)

## 2021-12-17 LAB — CMP (CANCER CENTER ONLY)
ALT: 13 U/L (ref 0–44)
AST: 15 U/L (ref 15–41)
Albumin: 4.2 g/dL (ref 3.5–5.0)
Alkaline Phosphatase: 70 U/L (ref 38–126)
Anion gap: 5 (ref 5–15)
BUN: 18 mg/dL (ref 8–23)
CO2: 32 mmol/L (ref 22–32)
Calcium: 9.5 mg/dL (ref 8.9–10.3)
Chloride: 102 mmol/L (ref 98–111)
Creatinine: 0.75 mg/dL (ref 0.44–1.00)
GFR, Estimated: >60 mL/min (ref >60)
Glucose, Bld: 86 mg/dL (ref 70–99)
Potassium: 4.3 mmol/L (ref 3.5–5.1)
Sodium: 139 mmol/L (ref 135–145)
Total Bilirubin: 0.3 mg/dL (ref 0.3–1.2)
Total Protein: 7.4 g/dL (ref 6.5–8.1)

## 2021-12-17 NOTE — Progress Notes (Signed)
Hematology and Oncology Follow Up Visit  Leah Bird [id number] 09-12-1950 71 y.o. 12/17/2021   Principle Diagnosis:  Left pulmonary embolism Left leg thromboembolic disease femoral vein down to peroneal vein Lupus anticoagulant positive - ?  Transient MTHFR -heterozygous  Current Therapy:   Xarelto 20 mg p.o. daily -complete 1 year in April 2022 Xarelto 10 mg po q day -- maintenance -  Start on 07/2020 Folic acid 2 mg p.o. daily     Interim History:  Leah Bird is back for follow-up.  She is so happy that she now has 3 grandchildren.  Her daughter in [place] had twin.  One was a boy and one was a girl.  I saw pictures.  They are both subacute.  The real big news is that her husband is moving out of the [facility] system.  He is going to set up his own private practice.  I know this is a little bit of an aggravation for them.  However, he feels this is best for his patients.  I am not surprised that he would make this decision based on his patients.  She has been feeling well.  She had little pain in the right leg.  There is been no swelling.  She is on low-dose Xarelto.  She is also on folic acid.  She has had no bleeding.  She has had no nausea or vomiting.  She has had no cough or chest wall pain.  She has had no rashes.  There is been no headache.  Overall, I would say performance status is probably ECOG 0.     Medications:  Current Outpatient Medications:    b complex vitamins tablet, Take 1 tablet by mouth daily., Disp: 100 tablet, Rfl: 3   Calcium Citrate-Vitamin D (CALCIUM CITRATE +D PO), Take 1 tablet by mouth daily. 600 mg of calcium citrate and 400 mg of D3, Disp: , Rfl:    Cholecalciferol (VITAMIN D3) 50 MCG (2000 UT) capsule, Take 2 capsules (4,000 Units total) by mouth daily. (Patient taking differently: Take 2,000 Units by mouth daily.), Disp: 100 capsule, Rfl: 3   folic acid (FOLVITE) 1 MG tablet, Take 2 tablets (2 mg total) by mouth daily., Disp: 180 tablet, Rfl:  6   HYDROcodone-acetaminophen (NORCO/VICODIN) 5-325 MG tablet, Take 1 tablet by mouth every 6 (six) hours as needed for moderate pain., Disp: 30 tablet, Rfl: 0   rivaroxaban (XARELTO) 10 MG TABS tablet, Take 1 tablet (10 mg total) by mouth daily., Disp: 90 tablet, Rfl: 3  Allergies:  Allergies  Allergen Reactions   Other Shortness Of Breath    Pseudocholinesterace- prolonged anesthesia. 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  Review of Systems: Review of Systems  Constitutional: Negative.   HENT:  Negative.    Eyes: Negative.   Respiratory: Negative.    Cardiovascular: Negative.   Gastrointestinal: Negative.   Endocrine: Negative.   Genitourinary: Negative.    Musculoskeletal: Negative.   Skin: Negative.   Neurological: Negative.   Hematological: Negative.   Psychiatric/Behavioral: Negative.      Physical Exam:  height is 5\' 3"  (1.6 m) and weight is 120 lb 0.6 oz (54.4 kg). Her oral temperature is 98.7 F (37.1 C). Her blood pressure is 108/67 and her pulse is 65. Her respiration is 18 and oxygen saturation  is 100%.   Wt Readings from Last 3 Encounters:  12/17/21 120 lb 0.6 oz (54.4 kg)  07/29/21 118 lb (53.5 kg)  04/30/21 119 lb 1.9 oz (54 kg)  To be I see no need that there is remodeling on the  Physical Exam Vitals reviewed.  HENT:     Head: Normocephalic and atraumatic.  Eyes:     Pupils: Pupils are equal, round, and reactive to light.  Cardiovascular:     Rate and Rhythm: Normal rate and regular rhythm.     Heart sounds: Normal heart sounds.  Pulmonary:     Effort: Pulmonary effort is normal.     Breath sounds: Normal breath sounds.  Abdominal:     General: Bowel sounds are normal.     Palpations: Abdomen is soft.  Musculoskeletal:        General: No tenderness or  deformity. Normal range of motion.     Cervical back: Normal range of motion.  Lymphadenopathy:     Cervical: No cervical adenopathy.  Skin:    General: Skin is warm and dry.     Findings: No erythema or rash.  Neurological:     Mental Status: She is alert and oriented to person, place, and time.  Psychiatric:        Behavior: Behavior normal.        Thought Content: Thought content normal.        Judgment: Judgment normal.      Lab Results  Component Value Date   WBC 8.2 12/17/2021   HGB 13.1 12/17/2021   HCT 40.1 12/17/2021   MCV 95.7 12/17/2021   PLT 299 12/17/2021     Chemistry      Component Value Date/Time   NA 139 12/17/2021 0938   K 4.3 12/17/2021 0938   CL 102 12/17/2021 0938   CO2 32 12/17/2021 0938   BUN 18 12/17/2021 0938   CREATININE 0.75 12/17/2021 0938      Component Value Date/Time   CALCIUM 9.5 12/17/2021 0938   ALKPHOS 70 12/17/2021 0938   AST 15 12/17/2021 0938   ALT 13 12/17/2021 0938   BILITOT 0.3 12/17/2021 0938      Impression and Plan: Leah Bird is a very charming 71 year old white female.  She certainly looks a lot younger.  She had a DVT in the left leg.  She had a pulmonary embolism.  The last time we checked her lupus anticoagulant it was negative.  I think that she will always have this chronic thrombus.  Again it is nonocclusive.  Again she will stay on the low-dose Xarelto.  I do think this would be a good idea given that she travels quite a bit.  I know that she is going back out to [place] in November.  I know that she takes aspirin when she goes out there to help minimize her risk of blood clots.  I think that we can probably get her back after the holidays.  Again, I am sure that everything will work out well for her husband.  He is a Copy of the medical community as he is one of the preeminent dermatologists in town.    Josph Macho, MD 9/14/202310:29 AM

## 2021-12-18 LAB — LUPUS ANTICOAGULANT PANEL
DRVVT: 46.9 s (ref 0.0–47.0)
PTT Lupus Anticoagulant: 31.2 s (ref 0.0–43.5)

## 2022-02-12 ENCOUNTER — Emergency Department (HOSPITAL_COMMUNITY): Payer: Medicare PPO

## 2022-02-12 ENCOUNTER — Inpatient Hospital Stay
Admission: EM | Admit: 2022-02-12 | Discharge: 2022-02-13 | DRG: 605 | Disposition: A | Discharge status: Home / Self Care | Payer: Medicare PPO | Attending: Student in an Organized Health Care Education/Training Program | Admitting: Student in an Organized Health Care Education/Training Program

## 2022-02-12 ENCOUNTER — Emergency Department (HOSPITAL_BASED_OUTPATIENT_CLINIC_OR_DEPARTMENT_OTHER): Payer: Medicare PPO

## 2022-02-12 DIAGNOSIS — R760 Raised antibody titer: Secondary | ICD-10-CM | POA: Diagnosis present

## 2022-02-12 DIAGNOSIS — M7989 Other specified soft tissue disorders: Secondary | ICD-10-CM

## 2022-02-12 DIAGNOSIS — E861 Hypovolemia: Secondary | ICD-10-CM | POA: Diagnosis present

## 2022-02-12 DIAGNOSIS — E7212 Methylenetetrahydrofolate reductase deficiency: Secondary | ICD-10-CM | POA: Diagnosis present

## 2022-02-12 DIAGNOSIS — Z79899 Other long term (current) drug therapy: Secondary | ICD-10-CM

## 2022-02-12 DIAGNOSIS — R42 Dizziness and giddiness: Secondary | ICD-10-CM

## 2022-02-12 DIAGNOSIS — S7011XA Contusion of right thigh, initial encounter: Principal | ICD-10-CM | POA: Diagnosis present

## 2022-02-12 DIAGNOSIS — D72829 Elevated white blood cell count, unspecified: Secondary | ICD-10-CM | POA: Diagnosis present

## 2022-02-12 DIAGNOSIS — W101XXA Fall (on)(from) sidewalk curb, initial encounter: Secondary | ICD-10-CM | POA: Diagnosis present

## 2022-02-12 DIAGNOSIS — D62 Acute posthemorrhagic anemia: Secondary | ICD-10-CM | POA: Diagnosis not present

## 2022-02-12 DIAGNOSIS — R55 Syncope and collapse: Secondary | ICD-10-CM | POA: Diagnosis present

## 2022-02-12 DIAGNOSIS — Z8249 Family history of ischemic heart disease and other diseases of the circulatory system: Secondary | ICD-10-CM

## 2022-02-12 DIAGNOSIS — M47816 Spondylosis without myelopathy or radiculopathy, lumbar region: Secondary | ICD-10-CM

## 2022-02-12 DIAGNOSIS — Y9301 Activity, walking, marching and hiking: Secondary | ICD-10-CM | POA: Diagnosis present

## 2022-02-12 DIAGNOSIS — Y92480 Sidewalk as the place of occurrence of the external cause: Secondary | ICD-10-CM

## 2022-02-12 DIAGNOSIS — I4510 Unspecified right bundle-branch block: Secondary | ICD-10-CM | POA: Diagnosis present

## 2022-02-12 DIAGNOSIS — W19XXXA Unspecified fall, initial encounter: Secondary | ICD-10-CM

## 2022-02-12 DIAGNOSIS — R4182 Altered mental status, unspecified: Secondary | ICD-10-CM

## 2022-02-12 DIAGNOSIS — I82502 Chronic embolism and thrombosis of unspecified deep veins of left lower extremity: Secondary | ICD-10-CM | POA: Diagnosis present

## 2022-02-12 DIAGNOSIS — Z86711 Personal history of pulmonary embolism: Secondary | ICD-10-CM

## 2022-02-12 DIAGNOSIS — Z7901 Long term (current) use of anticoagulants: Secondary | ICD-10-CM

## 2022-02-12 DIAGNOSIS — T148XXA Other injury of unspecified body region, initial encounter: Principal | ICD-10-CM | POA: Diagnosis present

## 2022-02-12 DIAGNOSIS — I951 Orthostatic hypotension: Secondary | ICD-10-CM | POA: Insufficient documentation

## 2022-02-12 DIAGNOSIS — I82561 Chronic embolism and thrombosis of right calf muscular vein: Secondary | ICD-10-CM

## 2022-02-12 DIAGNOSIS — Z2821 Immunization not carried out because of patient refusal: Secondary | ICD-10-CM

## 2022-02-12 LAB — BASIC METABOLIC PANEL, BLOOD
Anion Gap: 13 mmol/L (ref 7–15)
BUN: 15 mg/dL (ref 8–23)
Bicarbonate: 25 mmol/L (ref 22–29)
Calcium: 9 mg/dL (ref 8.5–10.6)
Chloride: 99 mmol/L (ref 98–107)
Creatinine: 0.68 mg/dL (ref 0.51–0.95)
Glucose: 135 mg/dL — ABNORMAL HIGH (ref 70–99)
Potassium: 4.5 mmol/L (ref 3.5–5.1)
Sodium: 137 mmol/L (ref 136–145)
eGFR Based on CKD-EPI 2021 Equation: >60 mL/min/{1.73_m2}

## 2022-02-12 LAB — ECG 12-LEAD
ATRIAL RATE: 69 {beats}/min
ATRIAL RATE: 81 {beats}/min
ECG INTERPRETATION: NORMAL
P AXIS: 54 degrees
P AXIS: 70 degrees
PR INTERVAL: 204 ms
PR INTERVAL: 214 ms
QRS INTERVAL/DURATION: 114 ms
QRS INTERVAL/DURATION: 130 ms
QT: 392 ms
QT: 420 ms
QTc (Bazett): 450 ms
QTc (Bazett): 455 ms
R AXIS: -32 degrees
R AXIS: 0 degrees
T AXIS: 15 degrees
T AXIS: 5 degrees
VENTRICULAR RATE: 69 {beats}/min
VENTRICULAR RATE: 81 {beats}/min

## 2022-02-12 LAB — CBC WITH DIFF, BLOOD
ANC-Automated: 9.5 10*3/uL — ABNORMAL HIGH (ref 1.6–7.0)
Abs Basophils: 0.1 10*3/uL (ref <0.2)
Abs Eosinophils: 0.2 10*3/uL (ref 0.0–0.5)
Abs Lymphs: 2.2 10*3/uL (ref 0.8–3.1)
Abs Monos: 0.9 10*3/uL — ABNORMAL HIGH (ref 0.2–0.8)
Basophils: 1 %
Eosinophils: 2 %
Hct: 35.8 % (ref 34.0–45.0)
Hgb: 11.9 gm/dL (ref 11.2–15.7)
Lymphocytes: 17 %
MCH: 31.3 pg (ref 26.0–32.0)
MCHC: 33.2 g/dL (ref 32.0–36.0)
MCV: 94.2 um3 (ref 79.0–95.0)
MPV: 10 fL (ref 9.4–12.4)
Monocytes: 7 %
Plt Count: 301 10*3/uL (ref 140–370)
RBC: 3.8 10*6/uL — ABNORMAL LOW (ref 3.90–5.20)
RDW: 13.2 % (ref 12.0–14.0)
Segs: 74 %
WBC: 12.8 10*3/uL — ABNORMAL HIGH (ref 4.0–10.0)

## 2022-02-12 LAB — TYPE & SCREEN
ABO/RH: A POS
Antibody Screen: NEGATIVE

## 2022-02-12 LAB — PROTHROMBIN TIME, BLOOD
INR: 1.2
PT,Patient: 13.3 s — ABNORMAL HIGH (ref 9.7–12.5)

## 2022-02-12 LAB — TROPONIN T GEN 5 W/REFLEX TO CK/CKMB
Troponin T Gen 5 w/Reflex CK/CKMB: 7 ng/L (ref <14)
Troponin T Gen 5: 8 ng/L (ref <14)

## 2022-02-12 LAB — ABO/RH CONFIRMATION: ABO/RH: A POS

## 2022-02-12 LAB — MAGNESIUM, BLOOD: Magnesium: 1.8 mg/dL (ref 1.6–2.4)

## 2022-02-12 LAB — PHOSPHORUS, BLOOD: Phosphorous: 3.3 mg/dL (ref 2.7–4.5)

## 2022-02-12 LAB — GLUCOSE (POCT): Glucose (POCT): 139 mg/dL — ABNORMAL HIGH (ref 70–99)

## 2022-02-12 LAB — APTT, BLOOD: PTT: 28 s (ref 27–36)

## 2022-02-12 MED ORDER — ACETAMINOPHEN 325 MG PO TABS
975.0000 mg | ORAL_TABLET | Freq: Once | ORAL | Status: AC
Start: 2022-02-12 — End: 2022-02-12
  Administered 2022-02-12: 975 mg via ORAL
  Filled 2022-02-12: qty 3

## 2022-02-12 MED ORDER — IOHEXOL 350 MG/ML IV SOLN
100.0000 mL | Freq: Once | INTRAVENOUS | Status: AC
Start: 2022-02-12 — End: 2022-02-12
  Administered 2022-02-12: 100 mL via INTRAVENOUS
  Filled 2022-02-12: qty 100

## 2022-02-12 NOTE — ED Notes (Signed)
Simon MD speaking with Pt.

## 2022-02-12 NOTE — ED Notes (Signed)
Report to Mike RN

## 2022-02-12 NOTE — ED Notes (Addendum)
Patient reported fall on thinners, MD notified and patient moved to Triage B.

## 2022-02-12 NOTE — ED Notes (Signed)
Applied ACE wrap bandage x 2 right hip

## 2022-02-12 NOTE — ED Notes (Signed)
Called CT. Ready for pt. Primary RN notified.

## 2022-02-12 NOTE — H&P (Signed)
Internal Medicine History & Physical:  Marissa Larsen, Marissa Larsen 1950-09-06, [id number]  02/12/2022    Chief Complaint:  "I fell and my husband insisted I come to the hospital"    History of Present Illness:     Marissa Larsen is a 71 year old female with past medical history of LLE DVT (07/2019), and L main pulmonary artery PE, on xarelto who p/w a RLE hematoma.    The patient stated she was walking with her family in [place] earlier in the day when she tripped over a crack in the sidewalk. She tried to brace herself onto her R hand but ended up falling onto her R hip. She remembers the whole event, denies any loss of consciousness, and denies light-headedness, dizziness, and chest pain leading up to it. The patient was feeling fine and waited at home for several hours then began feeling her heart race as well as some flushing and diaphoresis when standing for a while baking cookies with her family. She felt weak at this time and had to sit down. After arrival to the ED, the her husband witnessed her have a 30 second episode of syncope while she was sitting. There were no tonic-clonic movements or twitches during this episode and she promptly regained full consciousness. During the interview, the patient endorses mild light-headedness and nausea while standing.    Patient is very active, walks 5 miles a day and denies chest pain/SOB on those walks. She endorses mild SOB and needing to rest when walking up hill.     ROS  Patient endorses - light-headedness (after the fall), dizziness (after the fall), RLE pain, RLE bruising, palpitations (after the fall), nausea (after the fall)  Patient denies - f/c/n/v/d, chest pain (with exertion and at rest), palpitations (chronic), SOB, cough, abdominal pain, melena, hematochezia, dysuria, urinary urgency/frequency, hematuria    Orthostatic vitals in ED  Laying: BP - 124/59, p - 76  Sitting: BP 102/66, p - 75  Standing: BP - 82/58, p - 100    ED Course:  CTA Lower extremity - multiple  RLE hematomas w/ largest being ~6 cm, active extravasation of contrast.   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    Allergies:  No Known Allergies    Medications:  None     Social History:  -Alcohol use: none  -Tobacco use: none  -Recreational drug use: none  -Current living situation[place]/ [place]  -ADLs: independent    Family History:  Son with multiple coronary PCI at the age of 24  Father with CABG in 34s    Review of Systems:  See HPI    Physical Exam:  Temp  Avg: 97.4 F (36.3 C)  Min: 97.4 F (36.3 C)  Max: 97.4 F (36.3 C)  Pulse  Avg: 77  Min: 76  Max: 78  BP  Min: 125/85  Max: 150/83  No data recorded  Resp  Avg: 17  Min: 16  Max: 18  SpO2  Avg: 97.5 %  Min: 96 %  Max: 99 %    Physical Exam  GEN - resting comfortably in bed, in NAD, answering questions appropriately  HEENT - mmm, trachea midline, NC/AT  NEURO - AOx3, moving extremities spontaneously  CARDIAC - RRR, no m/r/g, radial pulses 2+  PULM - CTAB, no increased wob,  no use of accessory muscles  ABD - bowel sounds present, soft, NTND, no guarding, no rebound  EXTREMITIES - no LE or UE edema, wwp  SKIN - RLE bruising and hematoma on hip    Labs and Other Data:  Recent Labs     02/12/22  2002   NA 137   K 4.5   CL 99   BICARB 25   BUN 15   CREAT 0.68   [place] 9.0   MG 1.8   PHOS 3.3     Recent Labs     02/12/22  2002   WBC 12.8*   HGB 11.9   HCT 35.8   MCV 94.2   PLT 301   SEG 74   LYMPHS 17   MONOS 7   EOS 2     Recent Labs     02/12/22  2002   PTT 28   INR 1.2     Imaging:  CT Rt Lower Extremity WO/W IV Contrast  Result Date: 02/12/2022  IMPRESSION: THIS PRELIMINARY INTERPRETATION HAS NOT BEEN REVIEWED BY AN ATTENDING RADIOLOGIST: Extensive soft tissue edema with several hematomas in the right gluteal  region/posterolateral right thigh, the largest of which measures up to 6.7 cm and contains several curvilinear areas of hyperattenuation compatible with arterial extravasation. No acute fracture of the right femur. Important findings delineated above were seen at 22:10 on 02/12/2022 and were verbally communicated by Marisa Hua  to the care provider, Arlester Marker, at 22:27 on 02/12/2022.   CTRM:2002:verbal.     CT Head W/O Contrast  Result Date: 02/12/2022  IMPRESSION: No acute intracranial hemorrhage, mass effect, hydrocephalus, extra-axial collection, or evidence of large territorial infarct.     X-Ray Hip Unilateral With Pelvis 2 or 3 Views - Right    Result Date: 02/12/2022  IMPRESSION: There is no acute fracture or malalignment. The hip and sacroiliac joints are congruent with mild degenerative change. There is mild enthesopathic change at the bilateral greater trochanters, greater on the right. Moderate degenerative change lower lumbar spine. Partially imaged soft tissue edema overlying the right greater trochanter. Correlate clinically. Nonobstructive bowel gas pattern.    TTE 08/09/2019 ([facility]):  IMPRESSIONS  1. Left ventricular ejection fraction, by estimation, is 60 to 65%. The left ventricle has normal function. The left ventricle has no regional wall motion abnormalities. Left ventricular diastolic parameters are consistent with Grade I diastolic  dysfunction (impaired relaxation). The average left ventricular global longitudinal strain is -18.1 %.  2. Right ventricular systolic function is normal. The right ventricular size is normal. There is normal pulmonary artery systolic pressure.  3. The mitral valve is normal in structure. Trivial mitral valve regurgitation. No evidence of mitral stenosis.  4. The aortic valve is normal in structure. Aortic valve regurgitation is not visualized. No aortic stenosis is present.     CT PE 07/31/2019:  IMPRESSION: 1. Examination is positive for pulmonary embolus  with small volume thrombus in the bifurcation of the left main pulmonary artery, extending minimally into the segmental and subsegmental branches in the left lower lobe. Thin linear filling defect in the right involves the lower lobe pulmonary artery, with possible subsegmental right middle lobe emboli. Thromboembolic burden is small. Emboli in the right are likely subacute, age indeterminate on the left. 2. Mild central bronchial thickening, can be seen with bronchitis or reactive airways disease. 3. Endplate irregularity at T7-T8, and to a lesser extent T6-T7, chronic likely sequela of prior process with abnormal marrow signal on  thoracic MRI 01/29/2012.     Assessment and Care Plan:  Lamyah Creed is a 71 year old female with past medical history of LLE DVT (07/2019) and PE, on xarelto who p/w a RLE hematoma.     # RLE hematoma w/ acute blood loss  # Fall  # Orthostasis  Hgb 11.9, stable vitals. Given history and limited medical co-morbidities, suspect traumatic/mechanical fall after tripping over unstable surface. No cardiac history w/ a normal TTE 08/2019. No h/o seizures or epilepsy, no s/s of neurodegenerative disease. Do no believe has recurrent PE this admission. Normal HR and currently on anticoagulation. Suspect positive orthostatic vitals iso hypovolemia. Unable to determine if patient suffers from chronic orthostatic hypotension iso acute blood loss. Per IR consulted in ED, no intervention necessary. Will continue to hold anticoagulation while patient is admitted.  - Hold rivaroxaban  - 1L LR  - tele while admitted  - consider outpatient TTE  - CBC Q12H    # H/o LLE DVT  # H/o PE  Per chart review, occurred in 07/2019. Believe this to be a provoked event iso long plane flight and limited ambulation. Patient has been on Xarelto since. Recommend holding or discontinuing Xarelto since patient has completed 3-6 months of treatment for provoked DVT. Could consider re-starting after conversation with outpatient  hematologist.    # RBBB  # h/o family cardiac disease  Unclear if patient has new RBBB. No symptoms of CAD, although strong family history cardiovascular disease.  - f/u lipid panel  - consider OP TTE    Fluids: OK for fluids  Electrolytes: Replete PRN  Nutrition: No diet orders on file  VTE PPX: contraindicated  Access: PIVs  Foley: No foley catheter  Code: No Order  Dispo: home    Discussed with Attending Abbe Amsterdam MD, who agrees with above plan.    Louann Sjogren, MD PhD  [facility]  Resident Physician PGY-1

## 2022-02-12 NOTE — ED Notes (Signed)
Pt back from CT via gurney with Clinical research associate.

## 2022-02-12 NOTE — ED Notes (Signed)
X-ray at bedside

## 2022-02-12 NOTE — ED Notes (Signed)
Pt reports losing sensation in toes d/t wrapping. This RN re-wrapped pressure dressing at this time. Pt requesting tylenol, MD notified

## 2022-02-12 NOTE — ED Notes (Signed)
02/12/2022 8:22 PM Marissa [name]    An EKG was handed to Dr. Hermelinda Medicus.

## 2022-02-12 NOTE — ED Notes (Signed)
Bed: 18  Expected date:   Expected time:   Means of arrival:   Comments:  Move 8b here

## 2022-02-12 NOTE — ED Notes (Signed)
Pt to CT, via gurney

## 2022-02-12 NOTE — ED Provider Notes (Signed)
ED Provider Note  [facility] electronic medical record reviewed for pertinent medical history.     Marissa Larsen DOB: Jun 21, 1950 PMD: Provider, Not In System     Chief Complaint   Patient presents with   . Falls     Patient fell on to right side of hip and having significant swelling to that side and states it feels like a baseball size mass. Patient takes thinners (Xarelto) for chronic DVT on LLE. Denies LOC       HPI: Marissa Larsen is a 71 year old female with past medical history of chronic DVT in left lower extremity (on rivaroxaban), presenting to the emergency department with right hip pain/swelling after mechanical fall.  Per patient and patient's husband patient was walking at approximately 2:00 p.m. today when she tripped on uneven sidewalk and suffered a mechanical fall landing on her left hip.  Patient had pain to her left hip but was able to ambulate, did not hit her head and did not have any loss of consciousness.  No other symptoms just prior to fall.  Patient was able to return home and ambulate and approximately 7:00 p.m. this evening had an acute episode of presyncope/syncope where her husband reports that she became diaphoretic, pale, and needed assistance to sit down.  No trauma during this episode, but husband did report noticing a large hematoma to right lateral hip then.  Denies any other symptoms at this time including ongoing headache, dizziness, fevers, chills, chest pain, dyspnea, nausea, vomiting, abdominal pain, focal numbness or weakness.          External Data Sources (Select all that apply):      Pertinent Medical History:    PMHx: As above    No past surgical history on file.    No family history on file.    Current Outpatient Medications   Medication Instructions   . folic acid (FOLVITE) 1 mg, Oral, DAILY       Physical Exam  BP 101/61 (BP Location: Left arm, BP Patient Position: Semi-Fowlers)   Pulse 79   Temp 98.4 F (36.9 C)   Resp 25   Ht 5\' 3"  (1.6 m)   Wt 55.8 kg (123 lb 0.3 oz)    SpO2 100%   BMI 21.79 kg/m   Physical Exam  Gen: Alert. Patient is in no acute distress. Non-toxic appearing  HEENT: Head is normocephalic, atraumatic. Extra occular movements intact without nystagmus. Moist mucus membranes.  Cardiovascular: Regular rate and normal rhythm. Peripheral pulses 2+ and equal in all extremities.  Pulmonary/Chest: Lungs are clear to auscultation bilaterally. Normal work of breathing.  GI: Abdomen is soft, non-tender, non-distended. No guarding, rebound, or rigidity.  MSK:  Large hematoma with overlying ecchymosis noted to right lateral thigh.  No pedal edema.  Neuro: Cranial nerves II-XII grossly intact.  5/5 strength to bilateral upper and lower extremities.  Normal sensation throughout.  Normal speech.   Skin: Skin is warm and dry. No rashes. No lesions noted.  Psych: Appropriate mood and affect.    Orders/Medications    Orders Placed This Encounter   Procedures   . X-Ray Hip Unilateral With Pelvis 2 or 3 Views - Right   . CT Head W/O Contrast   . CT Rt Lower Extremity WO/W IV Contrast   . CBC w/ Diff Lavender   . Prothrombin Time, Blood Blue   . Type & Screen Lavender   . aPTT, Blood Blue   . Basic Metabolic Panel, Blood Green Plasma  Separator Tube   . Troponin T Gen 5 w/Reflex to CK/CKMB Green Plasma Separator Tube   . Troponin T Gen 5 w/Reflex to CK/CKMB Green Plasma Separator Tube   . Magnesium, Blood Green Plasma Separator Tube   . Phosphorus, Blood Green Plasma Separator Tube   . GLUCOSE (POCT)   . ABO/RH CONFIRMATION - See Instructions   . CBC w/ Diff Lavender   . Consult/Referral to [facility]) Clinic   . ECG 12 Lead   . ECG 12 Lead       Medications   iohexol (OMNIPAQUE 350) 350 MG/ML solution 100 mL (100 mL IntraVENOUS Given 02/12/22 2140)   acetaminophen (TYLENOL) tablet 975 mg (975 mg Oral Given 02/12/22 2329)   lactated ringers 1,000 mL IV bolus ( IntraVENOUS New Bag 02/13/22 0056)       Medical Decision Making/Assessment/Plan    This is a(n) 71  year old female with past medical history of DVT/PE in 2021 on rivaroxaban, presenting to the emergency department after mechanical fall with later episode of presyncope/syncope with noted large right hip hematoma.  During attending MD exam in triage, pt was initially alert and appropriate but had recurrent episode of lightheadedness, syncope while in wheelchair, was assoc with diaphoresis and eyes rolling back with pt not responding. Immediately brought to gurney and placed on cardiopulmonary monitoring.  Vital signs were immediately repeated after patient was roomed; blood pressure was noted to be [id number] systolic, not tachycardic.  EKG was obtained which did not show any ischemic changes or arrhythmias.  Given nature of syncope, most concern for hypovolemic blood loss in setting of acute right hip subcutaneous hematoma but also considered vasovagal/neurocardiogenic given prodrome of diaphoresis and patient became pale with stable vital signs upon immediate recheck.  There is concern for syncope due to ongoing bleed due to large hematoma and right lateral hip/thigh.    Otherwise low clinical suspicion to suggest life threatening etiology of syncope, such as myocardial ischemia (patient without significant risk factors, not currently endorsing CP), aortic dissection or similar vascular pathology (pulses symmetrical, no CP) , PE (on anticoagulation), ventricular arrhythmia, SAH or CVA (no headache, no neuro deficits). Doubt seizure. Very low clinical history to suggest significant dysrhythmias causing hemodynamic compromise.     Initial workup including EKG, CBC, BNP, Mag, phos, glucose, coags, type and screen, x-ray of the right hip, in CTA of right lower extremity.  Disposition pending workup.    Update:  CBC notable for hemoglobin 11.9.  Troponins normal.  No acute fracture shown on hip x-ray however CTA lower extremity did show arterial extravasation into superficial thigh hematoma.  Given these findings,  Interventional Radiology was consulted who stated that there was no acute need for embolization at this time, and recommended pressure bandaging, further monitoring, and holding home dose of anticoagulants.  Case was discussed with Medicine admitting team for further admission for monitoring and repeat hemoglobin. Pt remains NVI in RLE. Pt and husband updated.      ED Course/Updates/Disposition  ED Course as of 02/16/22 1843   Others' Documentation   Fri Feb 12, 2022   2033 Hgb: 11.9 [NS]   2039 X-Ray Hip Unilateral With Pelvis 2 or 3 Views - Right  IMPRESSION:  There is no acute fracture or malalignment. The hip and sacroiliac joints are congruent with mild degenerative change. There is mild enthesopathic change at the bilateral greater trochanters, greater on the right.     Moderate degenerative change lower lumbar spine.  Partially imaged soft tissue edema overlying the right greater trochanter. Correlate clinically.     Nonobstructive bowel gas pattern.   [NS]   2223 S/o NS. Hx of DVT on rivaroxaban. Here with presyncope. Large hematoma to right thigh. CT of leg pending. [BS]   2223 S/O NS: [id number] h/o DVT on rivaroxaban, here with presyncope, fall onto R thigh with large hematoma. CT RLE. Likely IR embolization. In triage another episode of presyncope. [AL]   2236 Per IR on-call, agree with compression bandage, holding anticoagulation. Bleed is in superficial compartment and does not require embolization at this point. [NS]   2254 Medicine paged for admission [NS]      ED Course User Index  [AL] Waldron Labs, MD  [BS] Supat, Alfonso Patten, MD  [NS] Dartha Lodge, MD         ED Clinical Impression as of 02/16/22 1843   Chronic anticoagulation   History of pulmonary embolus (PE)   Chronic deep vein thrombosis (DVT) of calf muscle vein of right lower extremity (CMS-HCC)   Syncope and collapse       CMS-HCC/Risk Adjustment Factor Diagnoses       Needs Recertification        Category and  Diagnosis From    CMS-HCC 107:  Pulmonary embolism (CMS-HCC) [facility]                               Data Reviewed:        Risk of Complications and/or Morbidity:               Dartha Lodge, MD  Resident  02/13/22 1349    Attending Attestation:    I personally interviewed and examined the patient during the ED visit beginning 02/12/2022 at 1934 hours, and I have reviewed the note by Dartha Lodge, MD for the ED visit beginning 02/12/2022 at 1934 hours. I agree with the Resident history, exam, assessment, and plan.       The following independent historians provided additional history: spouse. Information obtained: details as noted in HPI. Unable to obtain this information from the patient because: syncopal episode after witnessed fall.  primary care clinic note from (institution & date) [facility]. Information obtained: med list.            Data Reviewed:  XR right (interpreted by me): negative for acute bony findings  EKG (interpreted by me): No STEMI or arrhythmia     Risk of Complications and/or Morbidity:    Risk - Treatment: Admission was considered for this patient, and they were admitted given the severity of their illness.  1. Hematoma  CBC w/ Diff Lavender    Consult/Referral to Virtual Transition of Care [facility]) Clinic      2. Acute blood loss anemia  CBC w/ Diff Lavender      3. Chronic anticoagulation        4. History of pulmonary embolus (PE)        5. Chronic deep vein thrombosis (DVT) of calf muscle vein of right lower extremity (CMS-HCC)        6. Syncope and collapse             Does this Patient meet Critical Care Criteria? No  Kristy Lyn Gwenlyn Fudge, MD         Aleen Campi Alba Cory, MD  02/16/22 [phone]

## 2022-02-13 DIAGNOSIS — W19XXXA Unspecified fall, initial encounter: Secondary | ICD-10-CM | POA: Insufficient documentation

## 2022-02-13 DIAGNOSIS — I951 Orthostatic hypotension: Secondary | ICD-10-CM | POA: Insufficient documentation

## 2022-02-13 DIAGNOSIS — T148XXA Other injury of unspecified body region, initial encounter: Secondary | ICD-10-CM | POA: Insufficient documentation

## 2022-02-13 DIAGNOSIS — D62 Acute posthemorrhagic anemia: Secondary | ICD-10-CM | POA: Insufficient documentation

## 2022-02-13 DIAGNOSIS — R55 Syncope and collapse: Secondary | ICD-10-CM | POA: Insufficient documentation

## 2022-02-13 LAB — LIPID(CHOL FRACT) PANEL, BLOOD
Cholesterol: 153 mg/dL (ref <200)
HDL-Cholesterol: 49 mg/dL
LDL-Chol (Calc): 81 mg/dL (ref <160)
Non-HDL Cholesterol: 104 mg/dL
Triglycerides: 114 mg/dL (ref 10–170)

## 2022-02-13 LAB — CBC WITH DIFF, BLOOD
ANC-Automated: 5.8 10*3/uL (ref 1.6–7.0)
Abs Basophils: 0 10*3/uL (ref <0.2)
Abs Eosinophils: 0.1 10*3/uL (ref 0.0–0.5)
Abs Lymphs: 1.3 10*3/uL (ref 0.8–3.1)
Abs Monos: 0.6 10*3/uL (ref 0.2–0.8)
Basophils: 0 %
Eosinophils: 1 %
Hct: 32.5 % — ABNORMAL LOW (ref 34.0–45.0)
Hgb: 10.8 gm/dL — ABNORMAL LOW (ref 11.2–15.7)
Lymphocytes: 17 %
MCH: 31 pg (ref 26.0–32.0)
MCHC: 33.2 g/dL (ref 32.0–36.0)
MCV: 93.4 um3 (ref 79.0–95.0)
MPV: 10.6 fL (ref 9.4–12.4)
Monocytes: 8 %
Plt Count: 278 10*3/uL (ref 140–370)
RBC: 3.48 10*6/uL — ABNORMAL LOW (ref 3.90–5.20)
RDW: 13.2 % (ref 12.0–14.0)
Segs: 74 %
WBC: 7.8 10*3/uL (ref 4.0–10.0)

## 2022-02-13 MED ORDER — SODIUM CHLORIDE 0.9 % IJ SOLN (CUSTOM)
3.0000 mL | INTRAMUSCULAR | Status: DC | PRN
Start: 2022-02-13 — End: 2022-02-13

## 2022-02-13 MED ORDER — HYDROCODONE-ACETAMINOPHEN 5-325 MG OR TABS
1.0000 | ORAL_TABLET | Freq: Four times a day (QID) | ORAL | Status: DC | PRN
Start: 2022-02-13 — End: 2022-02-13

## 2022-02-13 MED ORDER — FOLIC ACID 1 MG OR TABS
1.0000 mg | ORAL_TABLET | Freq: Every day | ORAL | Status: DC
Start: 2022-02-13 — End: 2022-02-13
  Administered 2022-02-13: 1 mg via ORAL
  Filled 2022-02-13: qty 1

## 2022-02-13 MED ORDER — BISACODYL EC 5 MG OR TBEC
10.0000 mg | DELAYED_RELEASE_TABLET | Freq: Every evening | ORAL | Status: DC | PRN
Start: 2022-02-13 — End: 2022-02-13

## 2022-02-13 MED ORDER — SODIUM CHLORIDE 0.9% TKO INFUSION
INTRAVENOUS | Status: DC | PRN
Start: 2022-02-13 — End: 2022-02-13

## 2022-02-13 MED ORDER — SODIUM CHLORIDE 0.9 % IJ SOLN (CUSTOM)
3.0000 mL | Freq: Three times a day (TID) | INTRAMUSCULAR | Status: DC
Start: 2022-02-13 — End: 2022-02-13
  Administered 2022-02-13: 3 mL via INTRAVENOUS

## 2022-02-13 MED ORDER — ONDANSETRON HCL 4 MG/2ML IV SOLN
4.0000 mg | Freq: Four times a day (QID) | INTRAMUSCULAR | Status: DC | PRN
Start: 2022-02-13 — End: 2022-02-13

## 2022-02-13 MED ORDER — ACETAMINOPHEN 325 MG PO TABS
650.0000 mg | ORAL_TABLET | ORAL | Status: DC | PRN
Start: 2022-02-13 — End: 2022-02-13

## 2022-02-13 MED ORDER — INFLUENZA VAC 65+ YRS WRAPPED RECORD (~~LOC~~)
1.0000 | PREFILLED_SYRINGE | INTRAMUSCULAR | Status: DC
Start: 2022-02-13 — End: 2022-02-13

## 2022-02-13 MED ORDER — FOLIC ACID 1 MG OR TABS: 1.00 mg | ORAL_TABLET | Freq: Every day | ORAL | Status: AC

## 2022-02-13 MED ORDER — LACTATED RINGERS IV SOLN
Freq: Once | INTRAVENOUS | Status: AC
Start: 2022-02-13 — End: 2022-02-13

## 2022-02-13 MED ORDER — NALOXONE HCL 0.4 MG/ML IJ SOLN
0.1000 mg | INTRAMUSCULAR | Status: DC | PRN
Start: 2022-02-13 — End: 2022-02-13

## 2022-02-13 MED ORDER — RIVAROXABAN 10 MG PO TABS
10.00 mg | ORAL_TABLET | Freq: Every day | ORAL | Status: DC
Start: ? — End: 2022-02-13

## 2022-02-13 MED ORDER — HYDROCODONE-ACETAMINOPHEN 10-325 MG OR TABS
1.0000 | ORAL_TABLET | Freq: Four times a day (QID) | ORAL | Status: DC | PRN
Start: 2022-02-13 — End: 2022-02-13

## 2022-02-13 NOTE — Plan of Care (Signed)
Pt d/c home today with husband. Went over AVS and questions were answered. PIVs removed, no issues. Pt left with transport.

## 2022-02-13 NOTE — ED Notes (Signed)
Pt placed on purewick 

## 2022-02-13 NOTE — Consults (Signed)
VASCULAR AND INTERVENTIONAL RADIOLOGY CONSULTATION     IMPRESSION/PLAN   71 year old female with superficial right thigh hematoma with active extravasation. Recommend extension manual compression to tamponade the bleeding.      SUBJECTIVE   History obtained by chart review. 71 year old female with a relevant history of anticoagulation in the setting of PE/DVT who suffered a fall complicated by superficial thigh hematoma with active arterial phase extravasation on CTA.    OBJECTIVE     BP 128/65 (BP Location: Left arm, BP Patient Position: Semi-Fowlers)   Pulse 78   Temp 98.3 F (36.8 C)   Resp (!) 27   Ht 5\' 3"  (1.6 m)   Wt 55.8 kg (123 lb 0.3 oz)   SpO2 97%   BMI 21.79 kg/m     No Known Allergies    LABS     Lab Results   Component Value Date    PLT 278 02/13/2022    INR 1.2 02/12/2022    CREAT 0.68 02/12/2022    EGFRCKDEPI >60 02/12/2022         Case discussed with VIR attending physician Dr. 13/01/2022.    Levonne Spiller, MD  R5, PGY-7  Interventional Radiology    Non-Emergent or Inpatient Scheduling Questions:    IR Charge Nurse (M-F, 7 am - 5 pm)  [facility] - [phone]  [facility] - [phone]    Interventional Radiology Pagers:    Working Hours (M-F, 7 am - 5 pm)  [phone] - "Group [facility] Pager" [phone]  [facility] - "Group [facility] Pager" [phone]    After Hours ( M-F, 5 pm - 7 am and weekends)   All Hospitals: "Call IR Home" [phone]

## 2022-02-13 NOTE — Discharge Summary (Signed)
Date of Admission:  02/12/2022  Date of Discharge:  02/13/2022    Patient Name:  Marissa Larsen    Principal Diagnosis (required):  Hematoma    Hospital Problem List (required):  Active Hospital Problems    Diagnosis    Hematoma [T14.8XXA]    Near syncope [R55]    Fall [W19.XXXA]    Acute blood loss anemia [D62]    Orthostatic hypotension [I95.1]      Resolved Hospital Problems   No resolved problems to display.     Principal Procedure During This Hospitalization (required):  CT Rt Lower Extremity WO/W IV Contrast 02/13/2022  IMPRESSION: Extensive soft tissue edema with several hematomas in the right gluteal region/posterolateral right thigh, the largest of which measures up to 6.7 cm and contains several curvilinear areas of hyperattenuation compatible with arterial extravasation. No acute fracture of the right femur.     CT Head W/O Contrast 02/12/2022  IMPRESSION: No acute intracranial hemorrhage, mass effect, hydrocephalus, extra-axial collection, or evidence of large territorial infarct.     X-Ray Hip Unilateral With Pelvis 2 or 3 Views - Right 02/12/2022  IMPRESSION: There is no acute fracture or malalignment. The hip and sacroiliac joints are congruent with mild degenerative change. There is mild enthesopathic change at the bilateral greater trochanters, greater on the right. Moderate degenerative change lower lumbar spine. Partially imaged soft tissue edema overlying the right greater trochanter. Correlate clinically. Nonobstructive bowel gas pattern.     Consultations Obtained During This Hospitalization:  Interventional Radiology  71 year old female with superficial right thigh hematoma with active extravasation. Recommend extension manual compression to tamponade the bleeding.     Reason for Admission to the Hospital / History of Present Illness:  From H&P: "Rena Hunke is a 71 year old female with past medical history of LLE DVT (07/2019), and L main pulmonary artery PE, on xarelto who p/w a RLE  hematoma.  The patient stated she was walking with her family in [place] earlier in the day when she tripped over a crack in the sidewalk. She tried to brace herself onto her R hand but ended up falling onto her R hip. She remembers the whole event, denies any loss of consciousness, and denies light-headedness, dizziness, and chest pain leading up to it. The patient was feeling fine and waited at home for several hours then began feeling her heart race as well as some flushing and diaphoresis when standing for a while baking cookies with her family. She felt weak at this time and had to sit down. After arrival to the ED, the her husband witnessed her have a 30 second episode of syncope while she was sitting. There were no tonic-clonic movements or twitches during this episode and she promptly regained full consciousness. During the interview, the patient endorses mild light-headedness and nausea while standing.  Patient is very active, walks 5 miles a day and denies chest pain/SOB on those walks. She endorses mild SOB and needing to rest when walking up hill. "    Hospital Course by Problem (required):    #Mechanical fall  #RLE hematoma 2/2 mechanical fall   #Mild acute blood loss anemia  Suffered mechanical fall after tripping on uneven surface while walking, landing on hip. CTA noted several hematomas to right gluteal region/thigh with arterial extravasation, exacerbated by Xarelto use. Seen by IR who recommended manual compression to tamponade the bleeding, no acute need for embolization. CTH negative, no fractures on imaging. CBC with slight expected drop, otherwise  remained HDS. Pain controlled with tylenol/ice, eager to be discharged.   - continue pressure bandage/ACE wrap, family to continue to monitor site if evidence of worsening/progression  - continue holding rivaroxaban / aspirin for at least 1 week; follow up with outpatient hematology for further discussion of AC need  - can repeat CBC outpatient in  5-7d to trend; CBC ordered  - referred for [facility] for follow up; PCP is [place] of state with plan to remain in [place] for another 2 weeks     #Presyncope/dizziness  #Orthostatics  Most likely due to hypovolemia from recent bleed from hematoma. Orthostatic + in ED, received IV fluids with 1L LR with clinical improvement, remained HDS with resolution of sx with ambulation. Telemetry was unremarkable, no cardiac sx.   - counseled on conservative measures, gradual position changes, adequate hydration     #Hx of LLE DVT   #Hx of L PE  Per chart review, occurred in 07/2019. Believe this to be a provoked event iso long plane flight and limited ambulation. Followed by outpt heme-oncologist who has kept patent on xarelto b/c DVT had not completely resolved, flies frequently. Xarelto held in setting of above hematoma.  - cont to hold Xarelto x 1 week at least  - pt to follow up with her outpatient hematologist about ongoing need      #Fam hx of cardiac dx  #potential new RBBB  #MTHFR gene  Patient has high risk of cardiac dx based on family hx and gene. Lipid panel WNL.  -consider further work up outpt such as TTE     Tests Outstanding at Discharge Requiring Follow Up:  There are no tests or other items that require follow up after hospital discharge.    Discharge Condition (required):  Stable.    Key Physical Exam Findings at Discharge:  Mental Status Exam: Patient is alert and oriented to person, place, time, and situation.  Physical examination is significant for:  pleasant female, resting comfortably NAD. RRR, CTAB. LLE with diffuse purple ecchymosis to  .    Discharge Diet:  Regular.    Discharge Disposition:  Home.    Discharge Code Status:  Full code / full care  This code status is not changed from the time of admission.    Discharge Medications:     What To Do With Your Medications        CONTINUE taking these medications        Add'l Info   folic acid 1 MG tablet  Commonly known as: FOLVITE  Take 1 tablet (1 mg) by mouth  daily.   Refills: 0            STOP taking these medications      Xarelto 10 MG  Generic drug: rivaroxaban              Allergies:  )No Known Allergies    Follow Up Appointments:    Already Scheduled:  No future appointments.    PostDischarge Referrals and Orders       Consult/Referral to Virtual Transition of Care [facility]) Clinic      CBC w/ Diff Lav[place]            Certain appointment types may not appear in this section. Refer to the Post Discharge Referrals section of the After Visit Summary for further information.    Discharging Physician's Contact Information:  [facility] phone triage at [phone].

## 2022-02-13 NOTE — Discharge Instructions (Addendum)
Diagnosis and Reason for Admission    You were admitted to the hospital for the following reason(s):  Fall, hematoma to leg, dizziness with loss of consciousness    Your full diagnosis list is located on this After Visit Summary in the Hospital Problems section.    What Happened During Your Hospital Stay    The main tests and treatments done for you during this hospitalization were:    - CT scans of your leg which showed several hematomas (pooling of blood) with bleeding. This was reviewed by the interventional radiology team who did not recommend intervention. This was managed with compression wrap  - CT scan of your head, x-ray of hip did not show any acute findings  - You received IV fluids and were monitored on heart monitors (telemetry). Your blood counts had a slight decrease, which is expected, but you did not require any blood transfusion. Your blood thinners were held.      Instructions for After Discharge    The following evaluation is still important to complete after discharge from the hospital:    Please follow up promptly with your primary care physician upon discharge.   Discuss with your hematologist about your blood thinners. We plan to hold your Xarelto / Aspirin for at least 1 week.   You can have repeat lab work done in 5 days - 1 week to recheck your blood counts (ordered for [place] labs)    Your diet at home should be a regular diet.    Your activity level at home should be: as much exercise or activity as you can tolerate.    Specific activity restrictions: None. Keep pressure on the leg site, and can keep it elevated to help with the swelling.     Wound or tube care instructions: None    Sometimes tests performed in the hospital do not yet have results by the time a patient goes home.  The following key tests will need to be followed up at your next appointment: None     Your medication list is located on this After Visit Summary in the Current Discharge Medication List section.  Your nurse  will review this information with you before you leave the hospital.    It is very important for you to keep a current medication list with you in order to assist your doctors with your medical care.     Bring this After Visit Summary with you to your follow up appointments.    Reasons to Contact a Doctor Urgently    Call 911 or return to the hospital immediately if: You have a fever > 101, shortness of breath, feel dizzy, weak, or confused, have uncontrolled nausea/vomiting, chest pain, coughing or vomiting blood, bloody stools, or you feel worse, or are unable to perform your normal activities/duties.    You should contact either your primary care physician or your hospital physician for any of the following reasons:  any other health concerns or issues with your medications.    Your hospital physician at the time of hospital discharge is Toy, Annabelle Harman, MD    Your physicians strive to explain things in a way you can understand.  If you have any questions or concerns about your hospital care or your medications, your hospital physician can be contacted in the following manner:  [facility] phone triage at [phone].    New problems and symptoms unrelated to your hospitalization are best addressed by your primary outpatient physician (PCP), as  are requests for medication refills or appointment referrals after hospital discharge.  However, if new issues arise before you can see or contact your PCP, your hospital physician may be able to assist with these issues during this time.    What Needs to Happen Next After Discharge -- Appointments and Follow Up    Any appointments already scheduled at [facility] clinics will be listed in the Future Appointments section at the top of this After Visit Summary.  Any appointments that have been requested, but have not yet been scheduled, will be listed below that under Post Discharge Referrals.    Additional Information for You from your Case Manager and/or Social Worker (if  applicable)        Additional Information for You from your Inpatient Therapists (if applicable)        Additional Information for You from your Discharging Nurse (if applicable)        Handouts Given to You (if applicable)

## 2022-02-15 ENCOUNTER — Encounter: Payer: Self-pay | Admitting: Hematology & Oncology

## 2022-02-16 ENCOUNTER — Other Ambulatory Visit: Payer: Self-pay

## 2022-02-16 NOTE — Telephone Encounter (Signed)
VTOC APPOINTMENT SCHEDULED     Internal Referal received for VTOC    Patient has been contacted and agreed to scheduled appointment with hospitalist  [x] Mychart  [] Doximity  Instructions of video visit discussed w patient/cargiver.   PCP F/U: not sched yet     Future Appointments   Date Time Provider Department Center   02/19/2022 10:00 AM Horman, , MD [facility] Virtual Virtual

## 2022-02-17 ENCOUNTER — Other Ambulatory Visit: Payer: Medicare PPO | Attending: Physician Assistant

## 2022-02-17 ENCOUNTER — Other Ambulatory Visit: Payer: Self-pay

## 2022-02-17 DIAGNOSIS — T148XXA Other injury of unspecified body region, initial encounter: Secondary | ICD-10-CM | POA: Insufficient documentation

## 2022-02-17 DIAGNOSIS — D62 Acute posthemorrhagic anemia: Secondary | ICD-10-CM | POA: Insufficient documentation

## 2022-02-17 LAB — CBC WITH DIFF, BLOOD
ANC-Automated: 5.8 10*3/uL (ref 1.6–7.0)
Abs Basophils: 0 10*3/uL (ref <0.2)
Abs Eosinophils: 0.3 10*3/uL (ref 0.0–0.5)
Abs Lymphs: 1.5 10*3/uL (ref 0.8–3.1)
Abs Monos: 0.7 10*3/uL (ref 0.2–0.8)
Basophils: 1 %
Eosinophils: 3 %
Hct: 34.8 % (ref 34.0–45.0)
Hgb: 11.4 gm/dL (ref 11.2–15.7)
Lymphocytes: 18 %
MCH: 31.8 pg (ref 26.0–32.0)
MCHC: 32.8 g/dL (ref 32.0–36.0)
MCV: 96.9 um3 — ABNORMAL HIGH (ref 79.0–95.0)
MPV: 11.3 fL (ref 9.4–12.4)
Monocytes: 8 %
Plt Count: 328 10*3/uL (ref 140–370)
RBC: 3.59 10*6/uL — ABNORMAL LOW (ref 3.90–5.20)
RDW: 13.6 % (ref 12.0–14.0)
Segs: 70 %
WBC: 8.4 10*3/uL (ref 4.0–10.0)

## 2022-02-17 NOTE — Telephone Encounter (Signed)
Automated Post Discharge Call: No Issues Identified by patient    This patient received an automated post discharge call, inquiring about the following items as part of a successful transition to home.  The call informed the patient that if they indicate an issue with any of the following items, a Registered Nurse would call them.     Symptoms : feeling better or worse   Medications :  able to pick up new medications, understands administration instructions   Appointments: has follow up, needs assist with appointment scheduling   Discharge instructions: has questions about discharge instructions   Transportation : needs assist with transportation to appointments   Feedback: has feedback regarding hospital stay     The patient indicated that there were no problems to be addressed regarding the above issues.   [facility], Population Health    [phone] opt 2

## 2022-02-18 NOTE — Telephone Encounter (Signed)
Vtoc appt confirmed with patient husband  Details of appt mode and time reviewed with patient. Also confirmed no PCP appt has been done at this time and or is scheduled before next VToc appt.  Confirmed will be in state of [place] .    Future Appointments   Date Time Provider Department Center   02/19/2022 10:00 AM Horman, Bradley Ferris, MD [facility] Virtual Virtual

## 2022-02-19 ENCOUNTER — Telehealth (INDEPENDENT_AMBULATORY_CARE_PROVIDER_SITE_OTHER): Payer: Medicare PPO | Admitting: Internal Medicine

## 2022-02-19 DIAGNOSIS — E7212 Methylenetetrahydrofolate reductase deficiency: Secondary | ICD-10-CM

## 2022-02-19 DIAGNOSIS — W010XXD Fall on same level from slipping, tripping and stumbling without subsequent striking against object, subsequent encounter: Secondary | ICD-10-CM

## 2022-02-19 DIAGNOSIS — I951 Orthostatic hypotension: Secondary | ICD-10-CM

## 2022-02-19 DIAGNOSIS — Z86718 Personal history of other venous thrombosis and embolism: Secondary | ICD-10-CM

## 2022-02-19 DIAGNOSIS — W19XXXD Unspecified fall, subsequent encounter: Secondary | ICD-10-CM

## 2022-02-19 DIAGNOSIS — T148XXA Other injury of unspecified body region, initial encounter: Secondary | ICD-10-CM

## 2022-02-19 DIAGNOSIS — S7011XD Contusion of right thigh, subsequent encounter: Secondary | ICD-10-CM

## 2022-02-19 NOTE — Progress Notes (Signed)
 Hospital Medicine Virtual Transitions MyChart Video Visit      ---------------------(data below generated by Gardiner Coins, MD)--------------------     Patient Verification & Telemedicine Consent:  ---------------------(data below generated by Gardiner Coins, MD)--------------------     Patient Verification & Telemedicine Consent & Financial Waiver:    1.   Identity: I have verified this patient's identity to be accurate.  2.   Consent: I verify consent has been secured in one of the following methods: (a) obtained written/ online attestation consent (via MyChartVideoVisit pathway), (b) the spoke-side provider has obtained verbal or written consent from patient/surrogate (if this is a "provider to provider" evaluation), or (c) in all other cases, I have personally obtained verbal consent from the patient/ surrogate (noting all elements below) to perform this voluntary telemedicine evaluation (including obtaining history, performing examination and reviewing data provided by the patient).   The patient/ surrogate has the right to refuse this evaluation.  I have explained risks (including potential loss of confidentiality), benefits, alternatives, and the potential need for subsequent face to face care. Patient/ surrogate understands that there is a risk of medical inaccuracies given that our recommendations will be made based on reported data (and we must therefore assume this information is accurate).  Knowing that there is a risk that this information is not reported accurately, and that the telemedicine video, audio, or data feed may be incomplete, the patient agrees to proceed with evaluation and holds [place] harmless knowing these risks.  3.   Healthcare Team: The patient/ surrogate has been notified that other healthcare professionals (including students, residents and Engineer, maintenance) may be involved in this audio-video evaluation.   All laws concerning confidentiality and patient access to medical  records and copies of medical records apply to telemedicine.  4.   Privacy: If this is a Restaurant manager, fast food Visit, the patient/ surrogate has received the [place] Notice of Privacy Practices via E-Checkin process.  For all other video visit techniques, I have verbally provided the patient/ surrogate with the [place] web link in [place] (https://health.dDotCom.si.aspx) or Spanish (https://health.LavishToys.ch.aspx).  The patient/ surrogate acknowledges both being provided the NPP link, and has been offered to have the NPP mailed to the patient/ surrogate by [place] mail.  The patient/ surrogate has voiced understanding an acknowledgement of receipt of this NPP web address.  If the patient/surrogate has elected to receive the NPP via [place] mail, I verify that the NPP will be sent promptly to the patient/surrogate via [place] mail.  5.   Capacity: I have reviewed this above verification and consent paragraph with the patient/ surrogate and the patient is capacitated or has a surrogate. If the patient is not capacitated to understand the above, and no surrogate is available, since this is not an emergency evaluation, the visit will be rescheduled until such time that the patient can consent, or the surrogate is available to consent. If this is an emergency evaluation and the patient is not capacitated to understand the above, and no surrogate is available, I am proceeding with this evaluation as this is felt to be an emergency setting and no appropriate specialist is available at the bedside to perform these evaluations.  6.   Financial Waiver: If this is a Restaurant manager, fast food Visit, the patient has been made aware of the financial waiver via E-Checkin process.  For all other video visit techniques, an E-Checkin process is not performed.  As such, I have personally verbally informed the patient/ surrogate that this evaluation will be  a billable encounter similar to an in-person clinic visit, and the patient/ surrogate  has agreed to pay the fee for services rendered.  If we are billing insurance for the patient's telehealth visit, her out-of-pocket cost will be determined based on her plan and will be billed to her.  The patient/ surrogate has also been informed that if the patient does not have insurance or does not wish to use insurance, [facility] [address] [employer] price for a primary care telehealth visit is $59.00 and specialist telehealth visit is $88.00.  I have further informed the patient/ surrogate that in the event the patient has additional services provided in conjunction with the specialty visit (Ex. Psychotherapy services), those services will be billed at the current rate less a 45% discount.  7.   Intra-State Location: The patient/ surrogate attests to understanding that if the patient accesses these services from a location outside of [place], that the patient does so at the patient's own risk and initiative and that the patient is ultimately responsible for compliance with any laws or regulations associated with the patient's use.  8.   Specific Use:The patient/ surrogate understands that [facility] makes no representation that materials or servicesdelivered via telecommunication services, or listed on telemedicine websites, are appropriate or available for use in any other location.           Patient Demographics:   Medical Record #: [id number]     Date: February 21, 2022    Patient Name: Marissa Larsen  DOB: [birth year]   Age: 71 year old   Sex: female    Location: Home address on file        Evaluator(s):    Shilee Biggs was evaluated by me today.       Clinic Location: [facility]  [facility]OSPITAL[facility] VTOC  [address]  [place] [address]      Telemedicine Video Visit Telemedicine Appointment:  [name] is a 70 year old female who is attending a virtual based visit.   Patient consent was acquired.     PCP: Provider, Not In System     Recent Hospitalization:  Dates: 02/12/22  - 02/13/22    Discharging Physician: Evangeline Gula   Discharge Disposition: Home Routine   [facility]    Readmission risk (LACE+):   LACE+ Score: 50      Abbreviated Hospital Course (problem based)    # #Mechanical fall  #RLE hematoma 2/2 mechanical fall   #Mild acute blood loss anemia  Suffered mechanical fall after tripping on uneven surface while walking, landing on hip. CTA noted several hematomas to right gluteal region/thigh with arterial extravasation, exacerbated by Xarelto use. Seen by IR who recommended manual compression to tamponade the bleeding, no acute need for embolization. CTH negative, no fractures on imaging. CBC with slight expected drop, otherwise remained HDS. Pain controlled with tylenol/ice, eager to be discharged.   - continue pressure bandage/ACE wrap, family to continue to monitor site if evidence of worsening/progression  - continue holding rivaroxaban / aspirin for at least 1 week; follow up with outpatient hematology for further discussion of AC need  - can repeat CBC outpatient in 5-7d to trend; CBC ordered  - referred for [facility] for follow up; PCP is out of state with plan to remain in [place] for another 2 weeks     #Presyncope/dizziness  #Orthostatics  Most likely due to hypovolemia from recent bleed  from hematoma. Orthostatic + in ED, received IV fluids with 1L LR with clinical improvement, remained HDS with resolution of sx with ambulation. Telemetry was unremarkable, no cardiac sx.   - counseled on conservative measures, gradual position changes, adequate hydration     #Hx of LLE DVT   #Hx of L PE  Per chart review, occurred in 07/2019. Believe this to be a provoked event iso long plane flight and limited ambulation. Followed by outpt heme-oncologist who has kept patent on xarelto b/c DVT had not completely resolved, flies frequently. Xarelto held in setting of above hematoma.  - cont to hold Xarelto x 1 week at least  - pt to follow up with  her outpatient hematologist about ongoing need      #Fam hx of cardiac dx  #potential new RBBB  #MTHFR gene  Patient has high risk of cardiac dx based on family hx and gene. Lipid panel WNL.  -consider further work up outpt such as TTE       Medication changes made at discharge:  CONTINUE taking these medications         Add'l Info   folic acid 1 MG tablet  Commonly known as: FOLVITE  Take 1 tablet (1 mg) by mouth daily.    Refills: 0                STOP taking these medications       Xarelto 10 MG  Generic drug: rivaroxaban          Subjective (HPI):  Overall doing well   Plans to restart doac after a week of holding - already discussed with her PCP  Pain/edema improving  Walking without trouble, usually very active  No further signs of bleeding  No further dizziness/orthostasis        Medication adherence:  takes medications regularly and no intolerable side effects noted    Medications were reviewed and updated on the Active Medication List   No outpatient medications have been marked as taking for the 02/19/22 encounter [facility] Telemedicine) with Gardiner Coins, MD.        Past Medical History:    No past medical history on file.      Psychosocial:         Allergies:    No Known Allergies    Physical Exam:  Gen: no acute distress      Studies/Imaging:  Reviewed in Epic    WBC 8.4 (11/15) HGB 11.4 (11/15) PLT 328 (11/15)    HCT 34.8 (11/15)      Na 137 (11/10) CL 99 (11/10) BUN 15 (11/10) GLU   135* (11/10)   K 4.5 (11/10) CO2 25 (11/10) Cr 0.68 (11/10)      TP   ALT   TBILI   ALK PHOS      ALB   AST   DBILI               ASSESSMENT AND PLAN     Lakin Rhine is a 71 year old female here for post discharge f/u.     Diagnoses and all orders for this visit:    Hematoma    Fall, subsequent encounter    History of DVT (deep vein thrombosis)      Ok to resume anticoagulation - hx chronic DVT, frequent air travel to and from [place] ; discussed long term risk benefit profile today  Does not seem to be at risk  of more falls in  general at this point based on history  We discussed establishing care with a PCP in [facility] given frequent travel here.         The plan was carefully reviewed verbally with the patient and also affirmed that the patient understood next steps and follow up plan.         Post Discharge Follow-up Plan:    Recommended follow up with PCP: 1-2 weeks    Scheduled? Yes    Scheduled upcoming specialty follow-up:  No future appointments.      Specialty follow-up that is needed but not yet scheduled (with recommended timing):  N/a    Barriers to Follow up: none     Handoff sent to NOT IN SYSTEM PROVIDER, MD             I personally spent 25 minutes in direct care of the patient today, excluding time spent on any separately reportable procedures. I reviewed discharge summary, labs/imaging prior to visit and forwarded patient management recommendations to the patient's PCP.

## 2022-02-19 NOTE — Patient Instructions (Signed)
It was nice to meet you today. If you have any questions about our visit, please contact us at [phone]. Thank you for trusting us with your care!     Sincerely,  Maxson Oddo M.D.

## 2022-03-05 ENCOUNTER — Other Ambulatory Visit (HOSPITAL_COMMUNITY): Payer: Self-pay

## 2022-04-07 ENCOUNTER — Other Ambulatory Visit: Payer: Self-pay | Admitting: Internal Medicine

## 2022-04-07 MED ORDER — ZOSTER VAC RECOMB ADJUVANTED 50 MCG/0.5ML IM SUSR: INTRAMUSCULAR | 1 refills | Status: DC

## 2022-04-07 MED ORDER — RSVPREF3 VAC RECOMB ADJUVANTED 120 MCG/0.5ML IM SUSR: 0.5000 mL | Freq: Once | INTRAMUSCULAR | 0 refills | Status: AC

## 2022-04-07 NOTE — Progress Notes (Signed)
Needs Rx for vaccines sent

## 2022-04-20 ENCOUNTER — Other Ambulatory Visit: Payer: Self-pay

## 2022-04-20 ENCOUNTER — Encounter: Payer: Self-pay | Admitting: Hematology & Oncology

## 2022-04-20 ENCOUNTER — Inpatient Hospital Stay: Payer: Medicare HMO | Admitting: Hematology & Oncology

## 2022-04-20 ENCOUNTER — Inpatient Hospital Stay: Payer: Medicare HMO | Attending: Hematology & Oncology

## 2022-04-20 VITALS — BP 113/75 | HR 70 | Temp 98.6°F | Resp 18 | Ht 63.0 in | Wt 121.0 lb

## 2022-04-20 DIAGNOSIS — Z86711 Personal history of pulmonary embolism: Secondary | ICD-10-CM | POA: Diagnosis not present

## 2022-04-20 DIAGNOSIS — I82401 Acute embolism and thrombosis of unspecified deep veins of right lower extremity: Secondary | ICD-10-CM

## 2022-04-20 DIAGNOSIS — Z86718 Personal history of other venous thrombosis and embolism: Secondary | ICD-10-CM | POA: Diagnosis not present

## 2022-04-20 DIAGNOSIS — Z7901 Long term (current) use of anticoagulants: Secondary | ICD-10-CM | POA: Insufficient documentation

## 2022-04-20 LAB — CBC WITH DIFFERENTIAL (CANCER CENTER ONLY)
Abs Immature Granulocytes: 0.02 10*3/uL (ref 0.00–0.07)
Basophils Absolute: 0.1 10*3/uL (ref 0.0–0.1)
Basophils Relative: 1 %
Eosinophils Absolute: 0.2 10*3/uL (ref 0.0–0.5)
Eosinophils Relative: 3 %
HCT: 42.3 % (ref 36.0–46.0)
Hemoglobin: 13.7 g/dL (ref 12.0–15.0)
Immature Granulocytes: 0 %
Lymphocytes Relative: 24 %
Lymphs Abs: 1.8 10*3/uL (ref 0.7–4.0)
MCH: 30.8 pg (ref 26.0–34.0)
MCHC: 32.4 g/dL (ref 30.0–36.0)
MCV: 95.1 fL (ref 80.0–100.0)
Monocytes Absolute: 0.6 10*3/uL (ref 0.1–1.0)
Monocytes Relative: 7 %
Neutro Abs: 5 10*3/uL (ref 1.7–7.7)
Neutrophils Relative %: 65 %
Platelet Count: 370 10*3/uL (ref 150–400)
RBC: 4.45 MIL/uL (ref 3.87–5.11)
RDW: 12.8 % (ref 11.5–15.5)
WBC Count: 7.6 10*3/uL (ref 4.0–10.5)
nRBC: 0 % (ref 0.0–0.2)

## 2022-04-20 LAB — CMP (CANCER CENTER ONLY)
ALT: 11 U/L (ref 0–44)
AST: 15 U/L (ref 15–41)
Albumin: 4.4 g/dL (ref 3.5–5.0)
Alkaline Phosphatase: 71 U/L (ref 38–126)
Anion gap: 8 (ref 5–15)
BUN: 16 mg/dL (ref 8–23)
CO2: 33 mmol/L — ABNORMAL HIGH (ref 22–32)
Calcium: 9.9 mg/dL (ref 8.9–10.3)
Chloride: 99 mmol/L (ref 98–111)
Creatinine: 0.75 mg/dL (ref 0.44–1.00)
GFR, Estimated: >60 mL/min (ref >60)
Glucose, Bld: 94 mg/dL (ref 70–99)
Potassium: 4.1 mmol/L (ref 3.5–5.1)
Sodium: 140 mmol/L (ref 135–145)
Total Bilirubin: 0.6 mg/dL (ref 0.3–1.2)
Total Protein: 7.9 g/dL (ref 6.5–8.1)

## 2022-04-20 NOTE — Progress Notes (Signed)
Hematology and Oncology Follow Up Visit  Leah Bird [id number] April 29, 1950 72 y.o. 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  Transient MTHFR -heterozygous  Current Therapy:   Xarelto 20 mg p.o. daily -complete 1 year in April 2022 Xarelto 10 mg po q day -- maintenance -  Start on 49/7026 Folic acid 2 mg p.o. daily     Interim History:  Leah Bird is back for follow-up.  She is doing okay.  Unfortunately, when she was out in [place] back in November, she fell.  She hit her right buttock area.  She had a large hematoma.  She actually had to go to the emergency room.  She had some hypotension.  She really did not bleed out.  She had scans that were done.  CT of the brain did not show any evidence of bleed.  She did have a CT of the right leg.  This showed a large hematoma measuring 6.7 x 3.8 x 6 cm.  They did not reverse the Xarelto.  She currently is feeling much better.  She is exercising.  She is able to eat well.  They are headed back to [place] in a week or so.  But the big trip is going to be out to [place] and April.  Currently, I would have to say that her performance status is probably ECOG 1.     Medications:  Current Outpatient Medications:    b complex vitamins tablet, Take 1 tablet by mouth daily., Disp: 100 tablet, Rfl: 3   Calcium Citrate-Vitamin D (CALCIUM CITRATE +D PO), Take 1 tablet by mouth daily. 600 mg of calcium citrate and 400 mg of D3, Disp: , Rfl:    Cholecalciferol (VITAMIN D3) 50 MCG (2000 UT) capsule, Take 2 capsules (4,000 Units total) by mouth daily. (Patient taking differently: Take 2,000 Units by mouth daily.), Disp: 100 capsule, Rfl: 3   folic acid (FOLVITE) 1 MG tablet, Take 2 tablets (2 mg total) by mouth daily., Disp: 180 tablet, Rfl: 6   HYDROcodone-acetaminophen (NORCO/VICODIN) 5-325 MG tablet, Take 1 tablet by mouth  every 6 (six) hours as needed for moderate pain., Disp: 30 tablet, Rfl: 0   rivaroxaban (XARELTO) 10 MG TABS tablet, Take 1 tablet (10 mg total) by mouth daily., Disp: 90 tablet, Rfl: 3  Allergies:  Allergies  Allergen Reactions   Other Shortness Of Breath    Pseudocholinesterace- prolonged anesthesia. 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  Review of Systems: Review of Systems  Constitutional: Negative.   HENT:  Negative.    Eyes: Negative.   Respiratory: Negative.    Cardiovascular: Negative.   Gastrointestinal: Negative.   Endocrine: Negative.   Genitourinary: Negative.    Musculoskeletal: Negative.   Skin: Negative.   Neurological: Negative.   Hematological: Negative.   Psychiatric/Behavioral: Negative.      Physical Exam:  height is 5\' 3"  (1.6 m) and weight is 121 lb (54.9 kg). Her oral temperature is 98.6 F (37 C). Her blood pressure is 113/75 and her pulse is 70. Her respiration is 18 and oxygen saturation is 98%.   Wt Readings from Last 3 Encounters:  04/20/22 121 lb (54.9 kg)  12/17/21 120 lb 0.6 oz (54.4 kg)  07/29/21 118 lb (53.5 kg)  To be I see no need that there is remodeling on the  Physical Exam Vitals reviewed.  HENT:     Head: Normocephalic and atraumatic.  Eyes:     Pupils: Pupils are equal, round, and reactive to light.  Cardiovascular:     Rate and Rhythm: Normal rate and regular rhythm.     Heart sounds: Normal heart sounds.  Pulmonary:     Effort: Pulmonary effort is normal.     Breath sounds: Normal breath sounds.  Abdominal:     General: Bowel sounds are normal.     Palpations: Abdomen is soft.  Musculoskeletal:        General: No tenderness or deformity. Normal range of motion.     Cervical back: Normal range of motion.  Lymphadenopathy:      Cervical: No cervical adenopathy.  Skin:    General: Skin is warm and dry.     Findings: No erythema or rash.  Neurological:     Mental Status: She is alert and oriented to person, place, and time.  Psychiatric:        Behavior: Behavior normal.        Thought Content: Thought content normal.        Judgment: Judgment normal.      Lab Results  Component Value Date   WBC 7.6 04/20/2022   HGB 13.7 04/20/2022   HCT 42.3 04/20/2022   MCV 95.1 04/20/2022   PLT 370 04/20/2022     Chemistry      Component Value Date/Time   NA 140 04/20/2022 0937   K 4.1 04/20/2022 0937   CL 99 04/20/2022 0937   CO2 33 (H) 04/20/2022 0937   BUN 16 04/20/2022 0937   CREATININE 0.75 04/20/2022 0937      Component Value Date/Time   CALCIUM 9.9 04/20/2022 0937   ALKPHOS 71 04/20/2022 0937   AST 15 04/20/2022 0937   ALT 11 04/20/2022 0937   BILITOT 0.6 04/20/2022 0937      Impression and Plan: Leah Bird is a very charming 72 year old white female.  She certainly looks a lot younger.  She had a DVT in the left leg.  She had a pulmonary embolism.  The last time we checked her lupus anticoagulant it was negative.  I think that she will always have this chronic thrombus.  Again it is nonocclusive.  I just feel bad that she had this bleed with the fall.  Her blood counts are is quite normal now.  She will stay on the low-dose Xarelto.  She travels quite a bit.  I will plan to see her back in May.  This will be after she gets back from [place].   Siyon Linck R Naly Schwanz, MD 1/16/202411:03 AM

## 2022-05-03 DIAGNOSIS — R69 Illness, unspecified: Secondary | ICD-10-CM | POA: Diagnosis not present

## 2022-05-06 ENCOUNTER — Telehealth (INDEPENDENT_AMBULATORY_CARE_PROVIDER_SITE_OTHER): Payer: Medicare HMO | Admitting: Family Medicine

## 2022-05-06 ENCOUNTER — Encounter: Payer: Self-pay | Admitting: Family Medicine

## 2022-05-06 DIAGNOSIS — U071 COVID-19: Secondary | ICD-10-CM

## 2022-05-06 MED ORDER — NIRMATRELVIR/RITONAVIR (PAXLOVID)TABLET: 3.0000 | ORAL_TABLET | Freq: Two times a day (BID) | ORAL | 0 refills | Status: AC

## 2022-05-06 NOTE — Patient Instructions (Addendum)
Paxlovid sent to your pharmacy.   Please hold Xarelto as discussed. You may start it back the day after completing Paxlovid.   I communicated with Dr. Marin Olp and he is fine with the plan.

## 2022-05-06 NOTE — Progress Notes (Signed)
MyChart Video Visit    Virtual Visit via Video Note   This visit type was conducted due to national recommendations for restrictions regarding the COVID-19 Pandemic (e.g. social distancing) in an effort to limit this patient's exposure and mitigate transmission in our community. This patient is at least at moderate risk for complications without adequate follow up. This format is felt to be most appropriate for this patient at this time. Physical exam was limited by quality of the video and audio technology used for the visit. CMA was able to get the patient set up on a video visit.  Patient location: Home. Patient and provider in visit Provider location: Office  I discussed the limitations of evaluation and management by telemedicine and the availability of in person appointments. The patient expressed understanding and agreed to proceed.  Visit Date: 05/06/2022  Today's healthcare provider: Harland Dingwall, NP-C     Subjective:    Patient ID: Leah Bird, female    DOB: 1951/03/20, 72 y.o.   MRN: [id number]  Chief Complaint  Patient presents with   Covid Positive    HPI  Tested positive for Covid today. Symptom onset 3 days ago.  Body aches, low grade fever, ST, rhinorrhea, nasal congestion, and dry cough.   Using Dayquil and Afrin prn.   She is currently in [place].   On Xarelto for hx of DVT and PE.  States she has been able to come off of Xarelto as needed and she is fairly certain she can stop it long enough to take Paxlovid.   She did not take her Xarelto dose today  No dizziness, chest pain, palpitations, shortness of breath, abdominal pain, N/V/D currently.   This is her first known Covid infection.  Fully vaccinated.     Past Medical History:  Diagnosis Date   Arthritis    Chicken pox    Degenerative disk disease    whole spine-modic 1 spinal disease   History of UTI    No pertinent past medical history     Past Surgical History:   Procedure Laterality Date   DILATION AND CURETTAGE OF UTERUS  1990   Reacted to anesthesia/was on life support   EYE SURGERY     TUBAL LIGATION     WISDOM TOOTH EXTRACTION     under sedation    Family History  Problem Relation Age of Onset   Heart disease Mother    Arthritis Mother    Heart disease Father    Kidney disease Father    Heart attack Father    Arthritis Sister    Depression Sister    Diabetes Sister    Asthma Son    Hearing loss Son    Learning disabilities Son    Arthritis Maternal Grandmother    Hearing loss Maternal Grandfather    Hearing loss Paternal Grandfather    Heart attack Paternal Grandfather    Colon cancer Neg Hx    Rectal cancer Neg Hx    Stomach cancer Neg Hx     Social History   Socioeconomic History   Marital status: Married    Spouse name: Not on file   Number of children: Not on file   Years of education: Not on file   Highest education level: Not on file  Occupational History   Not on file  Tobacco Use   Smoking status: Never   Smokeless tobacco: Never  Vaping Use   Vaping Use: Never used  Substance and Sexual  Activity   Alcohol use: Yes    Alcohol/week: 4.0 standard drinks of alcohol    Types: 4 Glasses of wine per week   Drug use: No   Sexual activity: Not on file  Other Topics Concern   Not on file  Social History Narrative   Not on file   Social Determinants of Health   Financial Resource Strain: Not on file  Food Insecurity: Not on file  Transportation Needs: Not on file  Physical Activity: Not on file  Stress: Not on file  Social Connections: Not on file  Intimate Partner Violence: Not on file    Outpatient Medications Prior to Visit  Medication Sig Dispense Refill   b complex vitamins tablet Take 1 tablet by mouth daily. 100 tablet 3   Calcium Citrate-Vitamin D (CALCIUM CITRATE +D PO) Take 1 tablet by mouth daily. 600 mg of calcium citrate and 400 mg of D3     Cholecalciferol (VITAMIN D3) 50 MCG (2000 UT)  capsule Take 2 capsules (4,000 Units total) by mouth daily. (Patient taking differently: Take 2,000 Units by mouth daily.) 952 capsule 3   folic acid (FOLVITE) 1 MG tablet Take 2 tablets (2 mg total) by mouth daily. 180 tablet 6   HYDROcodone-acetaminophen (NORCO/VICODIN) 5-325 MG tablet Take 1 tablet by mouth every 6 (six) hours as needed for moderate pain. 30 tablet 0   rivaroxaban (XARELTO) 10 MG TABS tablet Take 1 tablet (10 mg total) by mouth daily. 90 tablet 3   No facility-administered medications prior to visit.    Allergies  Allergen Reactions   Other Shortness Of Breath    Pseudocholinesterace- prolonged anesthesia. 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 Wt Readings from Last 3 Encounters:  04/20/22 121 lb (54.9 kg)  12/17/21 120 lb 0.6 oz (54.4 kg)  07/29/21 118 lb (53.5 kg)   Alert and oriented and in no acute distress.  Respirations unlabored.  Speaking in complete sentences without difficulty.    Assessment & Plan:   Problem List Items Addressed This Visit   None Visit Diagnoses     COVID-19 virus infection    -  Primary   Relevant Medications   nirmatrelvir/ritonavir (PAXLOVID) 20 x 150 MG & 10 x 100MG  TABS      Reviewed recent note from Dr. Marin Olp as well as recent lab results showing normal renal function.  Communicated with Dr. Marin Olp via secure chat and he has no concerns with her stopping Xarelto to take Paxlovid.  Xarelto sent to her pharmacy in [place].  Counseling on how to take the medication and potential side effects.  Discussed symptomatic management.  Discussed guidelines for quarantine and isolation.  She will follow-up as needed.  I am having Leah Bird start on nirmatrelvir/ritonavir. I am also having her maintain her b complex vitamins, Calcium Citrate-Vitamin D  (CALCIUM CITRATE +D PO), Vitamin D3, HYDROcodone-acetaminophen, folic acid, and rivaroxaban.  Meds ordered this encounter  Medications   nirmatrelvir/ritonavir (PAXLOVID) 20 x 150 MG & 10 x 100MG  TABS    Sig: Take 3 tablets by mouth 2 (two) times daily for 5 days. (Take nirmatrelvir 150 mg two tablets twice daily for 5 days and ritonavir 100 mg one tablet twice daily for 5 days) Patient GFR is >60  Dispense:  30 tablet    Refill:  0    Order Specific Question:   Supervising Provider    Answer:   Pricilla Holm A [[id number]]    I discussed the assessment and treatment plan with the patient. The patient was provided an opportunity to ask questions and all were answered. The patient agreed with the plan and demonstrated an understanding of the instructions.   The patient was advised to call back or seek an in-person evaluation if the symptoms worsen or if the condition fails to improve as anticipated.  I provided 16 minutes of face-to-face time during this encounter.   Harland Dingwall, NP-C [facility] [place] [phone] (phone) [phone] (fax)  [place]

## 2022-05-20 ENCOUNTER — Encounter: Payer: Self-pay | Admitting: Internal Medicine

## 2022-05-22 ENCOUNTER — Other Ambulatory Visit: Payer: Self-pay | Admitting: Internal Medicine

## 2022-05-22 DIAGNOSIS — E785 Hyperlipidemia, unspecified: Secondary | ICD-10-CM

## 2022-05-31 ENCOUNTER — Other Ambulatory Visit (HOSPITAL_COMMUNITY): Payer: Self-pay

## 2022-06-07 ENCOUNTER — Encounter: Payer: Self-pay | Admitting: Hematology & Oncology

## 2022-06-07 DIAGNOSIS — I82401 Acute embolism and thrombosis of unspecified deep veins of right lower extremity: Secondary | ICD-10-CM

## 2022-06-08 MED ORDER — RIVAROXABAN 10 MG PO TABS: 10.0000 mg | ORAL_TABLET | Freq: Every day | ORAL | 3 refills | Status: DC

## 2022-06-23 ENCOUNTER — Ambulatory Visit
Admission: RE | Admit: 2022-06-23 | Discharge: 2022-06-23 | Disposition: A | Discharge status: Home / Self Care | Payer: No Typology Code available for payment source | Source: Ambulatory Visit | Attending: Internal Medicine | Admitting: Internal Medicine

## 2022-06-23 DIAGNOSIS — E785 Hyperlipidemia, unspecified: Secondary | ICD-10-CM

## 2022-06-24 ENCOUNTER — Telehealth: Payer: Self-pay

## 2022-06-24 NOTE — Telephone Encounter (Signed)
Called patient to schedule Medicare Annual Wellness Visit (AWV). Left message for patient to call back and schedule Medicare Annual Wellness Visit (AWV).  Last date of AWV: eligible for AWV-I as of 03/05/17  Please schedule an appointment at any time with [facility]. Patient is coming in to see PCP on 06/28/22, please schedule same day if possible.  Leah Bird, [place] (AAMA)  [facility] [phone]

## 2022-06-28 ENCOUNTER — Encounter: Payer: Self-pay | Admitting: Internal Medicine

## 2022-06-28 ENCOUNTER — Ambulatory Visit (INDEPENDENT_AMBULATORY_CARE_PROVIDER_SITE_OTHER): Payer: Medicare HMO | Admitting: Internal Medicine

## 2022-06-28 VITALS — BP 120/78 | HR 76 | Temp 98.6°F | Ht 63.0 in | Wt 124.0 lb

## 2022-06-28 DIAGNOSIS — I82402 Acute embolism and thrombosis of unspecified deep veins of left lower extremity: Secondary | ICD-10-CM | POA: Diagnosis not present

## 2022-06-28 DIAGNOSIS — Z0000 Encounter for general adult medical examination without abnormal findings: Secondary | ICD-10-CM | POA: Diagnosis not present

## 2022-06-28 DIAGNOSIS — I2699 Other pulmonary embolism without acute cor pulmonale: Secondary | ICD-10-CM | POA: Diagnosis not present

## 2022-06-28 DIAGNOSIS — I2510 Atherosclerotic heart disease of native coronary artery without angina pectoris: Secondary | ICD-10-CM | POA: Insufficient documentation

## 2022-06-28 DIAGNOSIS — I2583 Coronary atherosclerosis due to lipid rich plaque: Secondary | ICD-10-CM | POA: Diagnosis not present

## 2022-06-28 DIAGNOSIS — E559 Vitamin D deficiency, unspecified: Secondary | ICD-10-CM | POA: Diagnosis not present

## 2022-06-28 DIAGNOSIS — E538 Deficiency of other specified B group vitamins: Secondary | ICD-10-CM | POA: Diagnosis not present

## 2022-06-28 LAB — LIPID PANEL
Cholesterol: 191 mg/dL (ref 0–200)
HDL: 64.5 mg/dL (ref >39.00)
LDL Cholesterol: 106 mg/dL — ABNORMAL HIGH (ref 0–99)
NonHDL: 126.81
Total CHOL/HDL Ratio: 3
Triglycerides: 103 mg/dL (ref 0.0–149.0)
VLDL: 20.6 mg/dL (ref 0.0–40.0)

## 2022-06-28 LAB — URINALYSIS, ROUTINE W REFLEX MICROSCOPIC
Bilirubin Urine: NEGATIVE
Hgb urine dipstick: NEGATIVE
Ketones, ur: NEGATIVE
Nitrite: NEGATIVE
RBC / HPF: NONE SEEN (ref 0–?)
Specific Gravity, Urine: 1.015 (ref 1.000–1.030)
Total Protein, Urine: NEGATIVE
Urine Glucose: NEGATIVE
Urobilinogen, UA: 0.2 (ref 0.0–1.0)
pH: 7 (ref 5.0–8.0)

## 2022-06-28 LAB — CBC WITH DIFFERENTIAL/PLATELET
Basophils Absolute: 0 10*3/uL (ref 0.0–0.1)
Basophils Relative: 0.6 % (ref 0.0–3.0)
Eosinophils Absolute: 0.2 10*3/uL (ref 0.0–0.7)
Eosinophils Relative: 3 % (ref 0.0–5.0)
HCT: 41.3 % (ref 36.0–46.0)
Hemoglobin: 13.8 g/dL (ref 12.0–15.0)
Lymphocytes Relative: 22.8 % (ref 12.0–46.0)
Lymphs Abs: 1.4 10*3/uL (ref 0.7–4.0)
MCHC: 33.5 g/dL (ref 30.0–36.0)
MCV: 91.5 fl (ref 78.0–100.0)
Monocytes Absolute: 0.4 10*3/uL (ref 0.1–1.0)
Monocytes Relative: 7 % (ref 3.0–12.0)
Neutro Abs: 4.1 10*3/uL (ref 1.4–7.7)
Neutrophils Relative %: 66.6 % (ref 43.0–77.0)
Platelets: 346 10*3/uL (ref 150.0–400.0)
RBC: 4.51 Mil/uL (ref 3.87–5.11)
RDW: 14.7 % (ref 11.5–15.5)
WBC: 6.1 10*3/uL (ref 4.0–10.5)

## 2022-06-28 LAB — COMPREHENSIVE METABOLIC PANEL
ALT: 14 U/L (ref 0–35)
AST: 18 U/L (ref 0–37)
Albumin: 4.3 g/dL (ref 3.5–5.2)
Alkaline Phosphatase: 79 U/L (ref 39–117)
BUN: 16 mg/dL (ref 6–23)
CO2: 32 mEq/L (ref 19–32)
Calcium: 9.7 mg/dL (ref 8.4–10.5)
Chloride: 99 mEq/L (ref 96–112)
Creatinine, Ser: 0.77 mg/dL (ref 0.40–1.20)
GFR: 76.76 mL/min (ref >60.00)
Glucose, Bld: 92 mg/dL (ref 70–99)
Potassium: 4.5 mEq/L (ref 3.5–5.1)
Sodium: 139 mEq/L (ref 135–145)
Total Bilirubin: 0.5 mg/dL (ref 0.2–1.2)
Total Protein: 7.7 g/dL (ref 6.0–8.3)

## 2022-06-28 MED ORDER — METHYLPREDNISOLONE 4 MG PO TBPK: ORAL_TABLET | ORAL | 0 refills | Status: DC

## 2022-06-28 MED ORDER — AZITHROMYCIN 250 MG PO TABS: ORAL_TABLET | ORAL | 0 refills | Status: DC

## 2022-06-28 MED ORDER — DIPHENOXYLATE-ATROPINE 2.5-0.025 MG PO TABS: 1.0000 | ORAL_TABLET | Freq: Four times a day (QID) | ORAL | 0 refills | Status: DC | PRN

## 2022-06-28 MED ORDER — HYDROCODONE-ACETAMINOPHEN 5-325 MG PO TABS: 1.0000 | ORAL_TABLET | Freq: Four times a day (QID) | ORAL | 0 refills | Status: DC | PRN

## 2022-06-28 NOTE — Assessment & Plan Note (Signed)

## 2022-06-28 NOTE — Assessment & Plan Note (Signed)
No relapse 

## 2022-06-28 NOTE — Assessment & Plan Note (Signed)
On B complex 

## 2022-06-28 NOTE — Assessment & Plan Note (Addendum)
Total CT calcium score of 95.9 - 2024 On Xarelto Satins discussed Dr Marlou Porch - Cardiology consult ECHO - 2021 D3+K2 and ?MK7

## 2022-06-28 NOTE — Progress Notes (Signed)
Subjective:  Patient ID: Leah Bird, female    DOB: 18-Jun-1950  Age: 72 y.o. MRN: [id number]  CC: Annual Exam (physical)   HPI Jaspreet B Senske presents for a well exam R hip hematoma f/u Travelling to [place]   Outpatient Medications Prior to Visit  Medication Sig Dispense Refill   b complex vitamins tablet Take 1 tablet by mouth daily. 100 tablet 3   Calcium Citrate-Vitamin D (CALCIUM CITRATE +D PO) Take 1 tablet by mouth daily. 600 mg of calcium citrate and 400 mg of D3     Cholecalciferol (VITAMIN D3) 50 MCG (2000 UT) capsule Take 2 capsules (4,000 Units total) by mouth daily. (Patient taking differently: Take 2,000 Units by mouth daily.) [id number] capsule 3   folic acid (FOLVITE) 1 MG tablet Take 2 tablets (2 mg total) by mouth daily. 180 tablet 6   rivaroxaban (XARELTO) 10 MG TABS tablet Take 1 tablet (10 mg total) by mouth daily. 90 tablet 3   HYDROcodone-acetaminophen (NORCO/VICODIN) 5-325 MG tablet Take 1 tablet by mouth every 6 (six) hours as needed for moderate pain. 30 tablet 0   No facility-administered medications prior to visit.    ROS: Review of Systems  Constitutional:  Negative for activity change, appetite change, chills, fatigue and unexpected weight change.  HENT:  Negative for congestion, mouth sores and sinus pressure.   Eyes:  Negative for visual disturbance.  Respiratory:  Positive for shortness of breath. Negative for cough and chest tightness.   Gastrointestinal:  Negative for abdominal pain and nausea.  Genitourinary:  Negative for difficulty urinating, frequency and vaginal pain.  Musculoskeletal:  Positive for arthralgias and gait problem. Negative for back pain.  Skin:  Negative for pallor and rash.  Neurological:  Negative for dizziness, tremors, weakness, numbness and headaches.  Psychiatric/Behavioral:  Negative for confusion, sleep disturbance and suicidal ideas.     Objective:  BP 120/78 (BP Location: Right Arm, Patient Position: Sitting, Cuff  Size: Large)   Pulse 76   Temp 98.6 F (37 C) (Oral)   Ht 5\' 3"  (1.6 m)   Wt 124 lb (56.2 kg)   SpO2 96%   BMI 21.97 kg/m   BP Readings from Last 3 Encounters:  06/28/22 120/78  04/20/22 113/75  12/17/21 108/67    Wt Readings from Last 3 Encounters:  06/28/22 124 lb (56.2 kg)  04/20/22 121 lb (54.9 kg)  12/17/21 120 lb 0.6 oz (54.4 kg)    Physical Exam Constitutional:      General: She is not in acute distress.    Appearance: Normal appearance. She is well-developed.  HENT:     Head: Normocephalic.     Right Ear: External ear normal.     Left Ear: External ear normal.     Nose: Nose normal.  Eyes:     General:        Right eye: No discharge.        Left eye: No discharge.     Conjunctiva/sclera: Conjunctivae normal.     Pupils: Pupils are equal, round, and reactive to light.  Neck:     Thyroid: No thyromegaly.     Vascular: No JVD.     Trachea: No tracheal deviation.  Cardiovascular:     Rate and Rhythm: Normal rate and regular rhythm.     Heart sounds: Normal heart sounds.  Pulmonary:     Effort: No respiratory distress.     Breath sounds: No stridor. No wheezing.  Abdominal:  General: Bowel sounds are normal. There is no distension.     Palpations: Abdomen is soft. There is no mass.     Tenderness: There is no abdominal tenderness. There is no guarding or rebound.  Musculoskeletal:        General: No tenderness.     Cervical back: Normal range of motion and neck supple. No rigidity.  Lymphadenopathy:     Cervical: No cervical adenopathy.  Skin:    Findings: No erythema or rash.  Neurological:     Mental Status: She is oriented to person, place, and time.     Cranial Nerves: No cranial nerve deficit.     Motor: No abnormal muscle tone.     Coordination: Coordination normal.     Gait: Gait abnormal.     Deep Tendon Reflexes: Reflexes normal.  Psychiatric:        Behavior: Behavior normal.        Thought Content: Thought content normal.         Judgment: Judgment normal.    R lat hip w/pain  - hematoma   Lab Results  Component Value Date   WBC 7.6 04/20/2022   HGB 13.7 04/20/2022   HCT 42.3 04/20/2022   PLT 370 04/20/2022   GLUCOSE 94 04/20/2022   CHOL 247 (H) 04/20/2019   TRIG 111.0 04/20/2019   HDL 59.10 04/20/2019   LDLCALC 166 (H) 04/20/2019   ALT 11 04/20/2022   AST 15 04/20/2022   NA 140 04/20/2022   K 4.1 04/20/2022   CL 99 04/20/2022   CREATININE 0.75 04/20/2022   BUN 16 04/20/2022   CO2 33 (H) 04/20/2022   TSH 1.36 04/20/2019   INR 0.91 04/23/2011   HGBA1C 5.8 10/29/2019    CT CARDIAC SCORING ([facility] LOCATIONS ONLY)  Result Date: 06/23/2022 CLINICAL DATA:  72 year old white female with dyslipidemia. * Tracking Code: [place] * EXAM: CT CARDIAC CORONARY ARTERY CALCIUM SCORE TECHNIQUE: Non-contrast imaging through the heart was performed using prospective ECG gating. Image post processing was performed on an independent workstation, allowing for quantitative analysis of the heart and coronary arteries. Note that this exam targets the heart and the chest was not imaged in its entirety. COMPARISON:  Chest CTA 09/14/2019 FINDINGS: CORONARY CALCIUM SCORES: Left Main: 0 LAD: 51.6 LCx: 0 RCA: 44.3 Total Agatston Score: 95.9 MESA database percentile: 67 AORTA MEASUREMENTS: Ascending Aorta: 3.2 cm Descending Aorta: 2.4 cm OTHER FINDINGS: Atherosclerotic calcifications involving the thoracic aorta. Heart size is within normal limits. Visualized mediastinal structures are normal. Images of the upper abdomen are unremarkable. Motion artifact limits evaluation of the lungs. Hazy densities in the visualized lungs appear to represent some chronic changes. No significant airspace disease or consolidation in the visualized lungs. No large pleural effusions. Chronic disc space narrowing and endplate disease in the visualized spine. No acute bone abnormality. Mild S-shaped curvature in the thoracolumbar spine based on the topogram image.  IMPRESSION: 1. Total calcium score of 95.9, which places the patient in the 67th percentile for subjects of the same age, gender, and race/ethnicity. 2. Aortic Atherosclerosis (ICD10-I70.0). 3. Scoliosis and chronic disc disease in the visualized spine. Electronically Signed   By: Markus Daft M.D.   On: 06/23/2022 10:16    Assessment & Plan:   Problem List Items Addressed This Visit       Cardiovascular and Mediastinum   Pulmonary embolism ([place])    No relapse      Relevant Orders   TSH   Urinalysis  CBC with Differential/Platelet   Lipid panel   Comprehensive metabolic panel   Ambulatory referral to Cardiology   DVT (deep venous thrombosis) (HCC) (Chronic)    On Xarelto      Coronary atherosclerosis    Total CT calcium score of 95.9 - 2024 On Xarelto Satins discussed Dr Marlou Porch - Cardiology consult ECHO - 2021 D3+K2 and ?MK7        Other   Well adult exam    We discussed age appropriate health related issues, including available/recomended screening tests and vaccinations. Labs were ordered to be later reviewed . All questions were answered. We discussed one or more of the following - seat belt use, use of sunscreen/sun exposure exercise, fall risk reduction, second hand smoke exposure, firearm use and storage, seat belt use, a need for adhering to healthy diet and exercise. Labs were ordered.  All questions were answered. Travelling to [place]      Relevant Orders   TSH   Urinalysis   CBC with Differential/Platelet   Lipid panel   Comprehensive metabolic panel   Ambulatory referral to Cardiology   Vitamin D deficiency - Primary    Blue-Emu cream use 2-3 times a day Vit D3 and K2  ?MK7      Relevant Orders   TSH   Urinalysis   CBC with Differential/Platelet   Lipid panel   Comprehensive metabolic panel   Ambulatory referral to Cardiology   VITAMIN D 25 Hydroxy (Vit-D Deficiency, Fractures)   Low serum vitamin B12    On B complex      Relevant Orders    TSH   Urinalysis   CBC with Differential/Platelet   Lipid panel   Comprehensive metabolic panel   Ambulatory referral to Cardiology   Vitamin B12      Meds ordered this encounter  Medications   diphenoxylate-atropine (LOMOTIL) 2.5-0.025 MG tablet    Sig: Take 1 tablet by mouth 4 (four) times daily as needed for diarrhea or loose stools.    Dispense:  60 tablet    Refill:  0   methylPREDNISolone (MEDROL DOSEPAK) 4 MG TBPK tablet    Sig: As directed    Dispense:  21 tablet    Refill:  0   azithromycin (ZITHROMAX Z-PAK) 250 MG tablet    Sig: As directed    Dispense:  6 tablet    Refill:  0   HYDROcodone-acetaminophen (NORCO/VICODIN) 5-325 MG tablet    Sig: Take 1 tablet by mouth every 6 (six) hours as needed for moderate pain.    Dispense:  20 tablet    Refill:  0      Follow-up: Return in about 6 months (around 12/29/2022) for a follow-up visit.  Walker Kehr, MD

## 2022-06-28 NOTE — Assessment & Plan Note (Signed)
On Xarelto 

## 2022-06-28 NOTE — Assessment & Plan Note (Signed)
Blue-Emu cream use 2-3 times a day Vit D3 and K2  ?MK7

## 2022-06-28 NOTE — Patient Instructions (Addendum)
Blue-Emu cream use 2-3 times a day  Vit D3 and K2  ?MK7

## 2022-06-29 LAB — VITAMIN B12: Vitamin B-12: 223 pg/mL (ref 211–911)

## 2022-06-29 LAB — VITAMIN D 25 HYDROXY (VIT D DEFICIENCY, FRACTURES): VITD: 35.78 ng/mL (ref 30.00–100.00)

## 2022-06-29 LAB — TSH: TSH: 2.97 u[IU]/mL (ref 0.35–5.50)

## 2022-06-30 ENCOUNTER — Encounter: Payer: Self-pay | Admitting: Internal Medicine

## 2022-07-01 ENCOUNTER — Other Ambulatory Visit: Payer: Self-pay | Admitting: Internal Medicine

## 2022-07-01 MED ORDER — CEPHALEXIN 500 MG PO CAPS: 500.0000 mg | ORAL_CAPSULE | Freq: Four times a day (QID) | ORAL | 0 refills | Status: DC

## 2022-07-05 ENCOUNTER — Other Ambulatory Visit: Payer: Self-pay | Admitting: Internal Medicine

## 2022-07-05 MED ORDER — CEFUROXIME AXETIL 250 MG PO TABS: 250.0000 mg | ORAL_TABLET | Freq: Two times a day (BID) | ORAL | 0 refills | Status: DC

## 2022-07-31 DIAGNOSIS — R399 Unspecified symptoms and signs involving the genitourinary system: Secondary | ICD-10-CM | POA: Diagnosis not present

## 2022-07-31 DIAGNOSIS — R319 Hematuria, unspecified: Secondary | ICD-10-CM | POA: Diagnosis not present

## 2022-07-31 DIAGNOSIS — R350 Frequency of micturition: Secondary | ICD-10-CM | POA: Diagnosis not present

## 2022-07-31 DIAGNOSIS — N390 Urinary tract infection, site not specified: Secondary | ICD-10-CM | POA: Diagnosis not present

## 2022-08-10 ENCOUNTER — Inpatient Hospital Stay: Payer: Medicare HMO | Admitting: Hematology & Oncology

## 2022-08-10 ENCOUNTER — Other Ambulatory Visit: Payer: Self-pay

## 2022-08-10 ENCOUNTER — Encounter: Payer: Self-pay | Admitting: Hematology & Oncology

## 2022-08-10 ENCOUNTER — Inpatient Hospital Stay: Payer: Medicare HMO | Attending: Hematology & Oncology

## 2022-08-10 VITALS — BP 141/75 | HR 63 | Temp 97.9°F | Resp 18 | Ht 63.0 in | Wt 123.0 lb

## 2022-08-10 DIAGNOSIS — Z86711 Personal history of pulmonary embolism: Secondary | ICD-10-CM | POA: Insufficient documentation

## 2022-08-10 DIAGNOSIS — Z7901 Long term (current) use of anticoagulants: Secondary | ICD-10-CM | POA: Insufficient documentation

## 2022-08-10 DIAGNOSIS — Z86718 Personal history of other venous thrombosis and embolism: Secondary | ICD-10-CM | POA: Insufficient documentation

## 2022-08-10 DIAGNOSIS — I82402 Acute embolism and thrombosis of unspecified deep veins of left lower extremity: Secondary | ICD-10-CM

## 2022-08-10 DIAGNOSIS — I82401 Acute embolism and thrombosis of unspecified deep veins of right lower extremity: Secondary | ICD-10-CM

## 2022-08-10 LAB — CMP (CANCER CENTER ONLY)
ALT: 12 U/L (ref 0–44)
AST: 14 U/L — ABNORMAL LOW (ref 15–41)
Albumin: 4.3 g/dL (ref 3.5–5.0)
Alkaline Phosphatase: 73 U/L (ref 38–126)
Anion gap: 4 — ABNORMAL LOW (ref 5–15)
BUN: 14 mg/dL (ref 8–23)
CO2: 33 mmol/L — ABNORMAL HIGH (ref 22–32)
Calcium: 9.2 mg/dL (ref 8.9–10.3)
Chloride: 101 mmol/L (ref 98–111)
Creatinine: 0.72 mg/dL (ref 0.44–1.00)
GFR, Estimated: >60 mL/min (ref >60)
Glucose, Bld: 94 mg/dL (ref 70–99)
Potassium: 4.1 mmol/L (ref 3.5–5.1)
Sodium: 138 mmol/L (ref 135–145)
Total Bilirubin: 0.5 mg/dL (ref 0.3–1.2)
Total Protein: 7.5 g/dL (ref 6.5–8.1)

## 2022-08-10 LAB — CBC WITH DIFFERENTIAL (CANCER CENTER ONLY)
Abs Immature Granulocytes: 0.02 10*3/uL (ref 0.00–0.07)
Basophils Absolute: 0 10*3/uL (ref 0.0–0.1)
Basophils Relative: 1 %
Eosinophils Absolute: 0.2 10*3/uL (ref 0.0–0.5)
Eosinophils Relative: 3 %
HCT: 39.1 % (ref 36.0–46.0)
Hemoglobin: 12.7 g/dL (ref 12.0–15.0)
Immature Granulocytes: 0 %
Lymphocytes Relative: 27 %
Lymphs Abs: 1.8 10*3/uL (ref 0.7–4.0)
MCH: 31 pg (ref 26.0–34.0)
MCHC: 32.5 g/dL (ref 30.0–36.0)
MCV: 95.4 fL (ref 80.0–100.0)
Monocytes Absolute: 0.5 10*3/uL (ref 0.1–1.0)
Monocytes Relative: 7 %
Neutro Abs: 4 10*3/uL (ref 1.7–7.7)
Neutrophils Relative %: 62 %
Platelet Count: 310 10*3/uL (ref 150–400)
RBC: 4.1 MIL/uL (ref 3.87–5.11)
RDW: 13.4 % (ref 11.5–15.5)
WBC Count: 6.4 10*3/uL (ref 4.0–10.5)
nRBC: 0 % (ref 0.0–0.2)

## 2022-08-10 LAB — LACTATE DEHYDROGENASE: LDH: 127 U/L (ref 98–192)

## 2022-08-10 NOTE — Progress Notes (Signed)
Hematology and Oncology Follow Up Visit  Leah Bird [id number] 1950-05-04 72 y.o. 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  Transient MTHFR -heterozygous  Current Therapy:   Xarelto 20 mg p.o. daily -complete 1 year in April 2022 Xarelto 10 mg po q day -- maintenance -  Start on 07/2020 Folic acid 2 mg p.o. daily     Interim History:  Leah Bird is back for follow-up.  She and Leah Bird were recently in [place].  That a wonderful time over in [place].  Next, is that they will be heading out to [place] in June for a month.  She recently had a cardiac CT scan.  Apparently, the score was on the high side.  She will see cardiology this week.  She is doing nicely on the Xarelto.  She is having no problems with Xarelto.  She is also on folic acid.  She is exercising.  She is eating well.  She is having no change in bowel or bladder habits.  Think she is due for mammogram later this month.  She has not noted any leg swelling.  She has had no leg pain.  She has some chronic back issues but this is nothing new.  There is been no chest wall pain.  She has had no shortness of breath.  Overall, I would say that Leah performance status is probably ECOG 0.   Medications:  Current Outpatient Medications:    b complex vitamins tablet, Take 1 tablet by mouth daily., Disp: 100 tablet, Rfl: 3   Calcium Citrate-Vitamin D (CALCIUM CITRATE +D PO), Take 1 tablet by mouth daily. 600 mg of calcium citrate and 400 mg of D3, Disp: , Rfl:    Cholecalciferol (VITAMIN D3) 50 MCG (2000 UT) capsule, Take 2 capsules (4,000 Units total) by mouth daily. (Patient taking differently: Take 2,000 Units by mouth daily.), Disp: 100 capsule, Rfl: 3   folic acid (FOLVITE) 1 MG tablet, Take 2 tablets (2 mg total) by mouth daily., Disp: 180 tablet, Rfl: 6   HYDROcodone-acetaminophen  (NORCO/VICODIN) 5-325 MG tablet, Take 1 tablet by mouth every 6 (six) hours as needed for moderate pain., Disp: 20 tablet, Rfl: 0   rivaroxaban (XARELTO) 10 MG TABS tablet, Take 1 tablet (10 mg total) by mouth daily., Disp: 90 tablet, Rfl: 3  Allergies:  Allergies  Allergen Reactions   Other Shortness Of Breath    Pseudocholinesterace- prolonged anesthesia. 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  Review of Systems: Review of Systems  Constitutional: Negative.   HENT:  Negative.    Eyes: Negative.   Respiratory: Negative.    Cardiovascular: Negative.   Gastrointestinal: Negative.   Endocrine: Negative.   Genitourinary: Negative.    Musculoskeletal: Negative.   Skin: Negative.   Neurological: Negative.   Hematological: Negative.   Psychiatric/Behavioral: Negative.      Physical Exam:  height is 5\' 3"  (1.6 m) and weight is 123 lb (55.8 kg). Leah oral temperature is 97.9 F (36.6 C). Leah blood pressure is 145/81 (abnormal) and Leah pulse is 63. Leah respiration is 18 and oxygen saturation is 100%.   Wt Readings from Last 3 Encounters:  08/10/22 123 lb (55.8 kg)  06/28/22 124 lb (56.2  kg)  04/20/22 121 lb (54.9 kg)    Physical Exam Vitals reviewed.  HENT:     Head: Normocephalic and atraumatic.  Eyes:     Pupils: Pupils are equal, round, and reactive to light.  Cardiovascular:     Rate and Rhythm: Normal rate and regular rhythm.     Heart sounds: Normal heart sounds.  Pulmonary:     Effort: Pulmonary effort is normal.     Breath sounds: Normal breath sounds.  Abdominal:     General: Bowel sounds are normal.     Palpations: Abdomen is soft.  Musculoskeletal:        General: No tenderness or deformity. Normal range of motion.     Cervical back: Normal range of motion.   Lymphadenopathy:     Cervical: No cervical adenopathy.  Skin:    General: Skin is warm and dry.     Findings: No erythema or rash.  Neurological:     Mental Status: She is alert and oriented to person, place, and time.  Psychiatric:        Behavior: Behavior normal.        Thought Content: Thought content normal.        Judgment: Judgment normal.      Lab Results  Component Value Date   WBC 6.4 08/10/2022   HGB 12.7 08/10/2022   HCT 39.1 08/10/2022   MCV 95.4 08/10/2022   PLT 310 08/10/2022     Chemistry      Component Value Date/Time   NA 138 08/10/2022 0932   K 4.1 08/10/2022 0932   CL 101 08/10/2022 0932   CO2 33 (H) 08/10/2022 0932   BUN 14 08/10/2022 0932   CREATININE 0.72 08/10/2022 0932      Component Value Date/Time   CALCIUM 9.2 08/10/2022 0932   ALKPHOS 73 08/10/2022 0932   AST 14 (L) 08/10/2022 0932   ALT 12 08/10/2022 0932   BILITOT 0.5 08/10/2022 0932      Impression and Plan: Leah Bird is a very charming 73 year old white female.  She certainly looks a lot younger.  She had a DVT in the left leg.  She had a pulmonary embolism.  The last time we checked Leah lupus anticoagulant it was negative.  I think that she will always have this chronic thrombus.  Again it is nonocclusive.  I am so glad that she had a wonderful time [place].  She and Leah Bird will also have a great time in [place].  Ultimately, I suspect that they probably will move out to [place].  She will continue on the Xarelto.  We will plan to see Leah back in the Fall.  She was knows that she can call us if there is any problems before then.    Josph Macho, MD 5/7/202410:03 AM

## 2022-08-13 ENCOUNTER — Ambulatory Visit: Payer: Medicare HMO | Attending: Cardiology | Admitting: Cardiology

## 2022-08-13 ENCOUNTER — Encounter: Payer: Self-pay | Admitting: Cardiology

## 2022-08-13 VITALS — BP 147/86 | HR 59 | Ht 63.0 in | Wt 125.0 lb

## 2022-08-13 DIAGNOSIS — R072 Precordial pain: Secondary | ICD-10-CM | POA: Diagnosis not present

## 2022-08-13 DIAGNOSIS — I2510 Atherosclerotic heart disease of native coronary artery without angina pectoris: Secondary | ICD-10-CM

## 2022-08-13 DIAGNOSIS — R0602 Shortness of breath: Secondary | ICD-10-CM | POA: Diagnosis not present

## 2022-08-13 DIAGNOSIS — I2583 Coronary atherosclerosis due to lipid rich plaque: Secondary | ICD-10-CM | POA: Diagnosis not present

## 2022-08-13 NOTE — Patient Instructions (Signed)
Medication Instructions:  The current medical regimen is effective;  continue present plan and medications.  *If you need a refill on your cardiac medications before your next appointment, please call your pharmacy*   Testing/Procedures: Your physician has requested that you have an echocardiogram. Echocardiography is a painless test that uses sound waves to create images of your heart. It provides your doctor with information about the size and shape of your heart and how well your heart's chambers and valves are working. This procedure takes approximately one hour. There are no restrictions for this procedure. Please do NOT wear cologne, perfume, aftershave, or lotions (deodorant is allowed). Please arrive 15 minutes prior to your appointment time.    Your cardiac CT will be scheduled at:   [facility] [address] [place], [place] [address] [phone]  Please arrive at the [facility] and Children's Entrance (Entrance C2) of [facility] 30 minutes prior to test start time. You can use the FREE valet parking offered at entrance C (encouraged to control the heart rate for the test)  Proceed to the [facility] Radiology Department (first floor) to check-in and test prep.  All radiology patients and guests should use entrance C2 at [facility], accessed from [facility], even though the hospital's physical address listed is [address].    Please follow these instructions carefully (unless otherwise directed):  On the Night Before the Test: Be sure to Drink plenty of water. Do not consume any caffeinated/decaffeinated beverages or chocolate 12 hours prior to your test. Do not take any antihistamines 12 hours prior to your test.  On the Day of the Test: Drink plenty of water until 1 hour prior to the test. Do not eat any food 1 hour prior to test. You may take your regular medications prior to the test.  Take metoprolol  (Lopressor) two hours prior to test. If you take Furosemide/Hydrochlorothiazide/Spironolactone, please HOLD on the morning of the test. FEMALES- please wear underwire-free bra if available, avoid dresses & tight clothing  After the Test: Drink plenty of water. After receiving IV contrast, you may experience a mild flushed feeling. This is normal. On occasion, you may experience a mild rash up to 24 hours after the test. This is not dangerous. If this occurs, you can take Benadryl 25 mg and increase your fluid intake. If you experience trouble breathing, this can be serious. If it is severe call 911 IMMEDIATELY. If it is mild, please call our office. If you take any of these medications: Glipizide/Metformin, Avandament, Glucavance, please do not take 48 hours after completing test unless otherwise instructed.  We will call to schedule your test 2-4 weeks out understanding that some insurance companies will need an authorization prior to the service being performed.   For non-scheduling related questions, please contact the cardiac imaging nurse navigator should you have any questions/concerns: Rockwell Alexandria, Cardiac Imaging Nurse Navigator Larey Brick, Cardiac Imaging Nurse Navigator [facility] Direct Office Dial: [phone]   For scheduling needs, including cancellations and rescheduling, please call [place], [phone].   Follow-Up: At [facility], you and your health needs are our priority.  As part of our continuing mission to provide you with exceptional heart care, we have created designated Provider Care Teams.  These Care Teams include your primary Cardiologist (physician) and Advanced Practice Providers (APPs -  Physician Assistants and Nurse Practitioners) who all work together to provide you with the care you  need, when you need it.  We recommend signing up for the patient portal called "MyChart".  Sign up information is provided on this  After Visit Summary.  MyChart is used to connect with patients for Virtual Visits (Telemedicine).  Patients are able to view lab/test results, encounter notes, upcoming appointments, etc.  Non-urgent messages can be sent to your provider as well.   To learn more about what you can do with MyChart, go to [email or web address].    Your next appointment:   1 year(s)  Provider:   Dr Donato Schultz
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
## 2022-08-24 ENCOUNTER — Telehealth (HOSPITAL_COMMUNITY): Payer: Self-pay | Admitting: *Deleted

## 2022-08-24 NOTE — Telephone Encounter (Signed)
Reaching out to patient to offer assistance regarding upcoming cardiac imaging study; pt verbalizes understanding of appt date/time, parking situation and where to check in, pre-test NPO status, and verified current allergies; name and call back number provided for further questions should they arise  Brailyn Delman RN Navigator Cardiac Imaging [facility] [phone] office [phone] cell  Patient aware to arrive at 2pm. 

## 2022-08-25 ENCOUNTER — Ambulatory Visit (HOSPITAL_BASED_OUTPATIENT_CLINIC_OR_DEPARTMENT_OTHER)
Admission: RE | Admit: 2022-08-25 | Discharge: 2022-08-25 | Disposition: A | Discharge status: Home / Self Care | Payer: Medicare HMO | Source: Ambulatory Visit | Attending: Internal Medicine | Admitting: Internal Medicine

## 2022-08-25 ENCOUNTER — Ambulatory Visit (HOSPITAL_COMMUNITY)
Admission: RE | Admit: 2022-08-25 | Discharge: 2022-08-25 | Disposition: A | Discharge status: Home / Self Care | Payer: Medicare HMO | Source: Ambulatory Visit | Attending: Cardiology | Admitting: Cardiology

## 2022-08-25 ENCOUNTER — Other Ambulatory Visit: Payer: Self-pay | Admitting: Internal Medicine

## 2022-08-25 DIAGNOSIS — R931 Abnormal findings on diagnostic imaging of heart and coronary circulation: Secondary | ICD-10-CM

## 2022-08-25 DIAGNOSIS — R072 Precordial pain: Secondary | ICD-10-CM | POA: Diagnosis present

## 2022-08-25 DIAGNOSIS — I2510 Atherosclerotic heart disease of native coronary artery without angina pectoris: Secondary | ICD-10-CM | POA: Insufficient documentation

## 2022-08-25 DIAGNOSIS — I2583 Coronary atherosclerosis due to lipid rich plaque: Secondary | ICD-10-CM | POA: Insufficient documentation

## 2022-08-25 DIAGNOSIS — R0602 Shortness of breath: Secondary | ICD-10-CM | POA: Insufficient documentation

## 2022-08-25 MED ORDER — NITROGLYCERIN 0.4 MG SL SUBL
0.8000 mg | SUBLINGUAL_TABLET | Freq: Once | SUBLINGUAL | Status: AC
  Administered 2022-08-25: 0.8 mg via SUBLINGUAL

## 2022-08-25 MED ORDER — NITROGLYCERIN 0.4 MG SL SUBL
SUBLINGUAL_TABLET | SUBLINGUAL | Status: AC
  Filled 2022-08-25: qty 2

## 2022-08-25 MED ORDER — METOPROLOL TARTRATE 5 MG/5ML IV SOLN
INTRAVENOUS | Status: AC
  Filled 2022-08-25: qty 5

## 2022-08-25 MED ORDER — IOHEXOL 350 MG/ML SOLN
95.0000 mL | Freq: Once | INTRAVENOUS | Status: AC | PRN
  Administered 2022-08-25: 95 mL via INTRAVENOUS

## 2022-08-25 NOTE — Progress Notes (Signed)
Patient tolerated CT well.Vital signs stable encourage to drink water throughout day.Reasons explained and verbalized understanding. Ambulated steady gait.   

## 2022-08-25 NOTE — Progress Notes (Signed)
CT sent for FFR 

## 2022-08-26 DIAGNOSIS — Z01419 Encounter for gynecological examination (general) (routine) without abnormal findings: Secondary | ICD-10-CM | POA: Diagnosis not present

## 2022-08-26 DIAGNOSIS — Z1231 Encounter for screening mammogram for malignant neoplasm of breast: Secondary | ICD-10-CM | POA: Diagnosis not present

## 2022-08-26 DIAGNOSIS — Z6822 Body mass index (BMI) 22.0-22.9, adult: Secondary | ICD-10-CM | POA: Diagnosis not present

## 2022-08-27 ENCOUNTER — Encounter: Payer: Self-pay | Admitting: Cardiology

## 2022-08-31 ENCOUNTER — Other Ambulatory Visit: Payer: Self-pay | Admitting: *Deleted

## 2022-08-31 DIAGNOSIS — Z79899 Other long term (current) drug therapy: Secondary | ICD-10-CM

## 2022-08-31 DIAGNOSIS — I2510 Atherosclerotic heart disease of native coronary artery without angina pectoris: Secondary | ICD-10-CM

## 2022-08-31 DIAGNOSIS — I2583 Coronary atherosclerosis due to lipid rich plaque: Secondary | ICD-10-CM

## 2022-08-31 MED ORDER — ROSUVASTATIN CALCIUM 10 MG PO TABS: 10.0000 mg | ORAL_TABLET | Freq: Every day | ORAL | 3 refills | Status: DC

## 2022-08-31 NOTE — Progress Notes (Signed)
Leah Bathe, MD 08/27/2022  8:07 AM EDT     Mild to moderate coronary plaque that is non flow limiting. Start Crestor 10mg  once a day Check lipid panel in 2 months Donato Schultz, MD    Pt is aware of the above information.  RX sent into pharmacy of choice and Lipid panel ordered.

## 2022-09-23 ENCOUNTER — Encounter: Payer: Self-pay | Admitting: Internal Medicine

## 2022-09-27 ENCOUNTER — Other Ambulatory Visit: Payer: Self-pay | Admitting: Internal Medicine

## 2022-09-27 DIAGNOSIS — M25559 Pain in unspecified hip: Secondary | ICD-10-CM

## 2022-09-30 ENCOUNTER — Ambulatory Visit (HOSPITAL_COMMUNITY): Payer: Medicare HMO | Attending: Cardiology

## 2022-09-30 DIAGNOSIS — R072 Precordial pain: Secondary | ICD-10-CM | POA: Diagnosis not present

## 2022-09-30 DIAGNOSIS — R0602 Shortness of breath: Secondary | ICD-10-CM

## 2022-09-30 LAB — ECHOCARDIOGRAM COMPLETE
Area-P 1/2: 2.33 cm2
P 1/2 time: 610 msec
S' Lateral: 2.5 cm

## 2022-10-06 DIAGNOSIS — M25551 Pain in right hip: Secondary | ICD-10-CM | POA: Insufficient documentation

## 2022-10-11 DIAGNOSIS — M24851 Other specific joint derangements of right hip, not elsewhere classified: Secondary | ICD-10-CM | POA: Diagnosis not present

## 2022-10-11 DIAGNOSIS — S76311A Strain of muscle, fascia and tendon of the posterior muscle group at thigh level, right thigh, initial encounter: Secondary | ICD-10-CM | POA: Diagnosis not present

## 2022-10-11 DIAGNOSIS — R600 Localized edema: Secondary | ICD-10-CM | POA: Diagnosis not present

## 2022-10-11 DIAGNOSIS — M67951 Unspecified disorder of synovium and tendon, right thigh: Secondary | ICD-10-CM | POA: Diagnosis not present

## 2022-10-11 DIAGNOSIS — M1611 Unilateral primary osteoarthritis, right hip: Secondary | ICD-10-CM | POA: Diagnosis not present

## 2022-10-11 DIAGNOSIS — S73191A Other sprain of right hip, initial encounter: Secondary | ICD-10-CM | POA: Diagnosis not present

## 2022-10-11 DIAGNOSIS — S76011A Strain of muscle, fascia and tendon of right hip, initial encounter: Secondary | ICD-10-CM | POA: Diagnosis not present

## 2022-10-13 DIAGNOSIS — M7061 Trochanteric bursitis, right hip: Secondary | ICD-10-CM | POA: Diagnosis not present

## 2022-10-13 DIAGNOSIS — M1611 Unilateral primary osteoarthritis, right hip: Secondary | ICD-10-CM | POA: Diagnosis not present

## 2022-10-14 DIAGNOSIS — M1611 Unilateral primary osteoarthritis, right hip: Secondary | ICD-10-CM | POA: Insufficient documentation

## 2022-10-14 DIAGNOSIS — M7061 Trochanteric bursitis, right hip: Secondary | ICD-10-CM | POA: Insufficient documentation

## 2022-10-20 DIAGNOSIS — M25551 Pain in right hip: Secondary | ICD-10-CM | POA: Diagnosis not present

## 2022-11-04 ENCOUNTER — Ambulatory Visit: Payer: Medicare HMO | Attending: Cardiology

## 2022-11-04 DIAGNOSIS — I2510 Atherosclerotic heart disease of native coronary artery without angina pectoris: Secondary | ICD-10-CM

## 2022-11-04 DIAGNOSIS — Z79899 Other long term (current) drug therapy: Secondary | ICD-10-CM

## 2022-11-04 DIAGNOSIS — I2583 Coronary atherosclerosis due to lipid rich plaque: Secondary | ICD-10-CM | POA: Diagnosis not present

## 2022-11-04 LAB — LIPID PANEL
Chol/HDL Ratio: 2.2 ratio (ref 0.0–4.4)
Cholesterol, Total: 150 mg/dL (ref 100–199)
HDL: 69 mg/dL (ref >39)
LDL Chol Calc (NIH): 68 mg/dL (ref 0–99)
Triglycerides: 63 mg/dL (ref 0–149)
VLDL Cholesterol Cal: 13 mg/dL (ref 5–40)

## 2022-11-18 ENCOUNTER — Encounter (INDEPENDENT_AMBULATORY_CARE_PROVIDER_SITE_OTHER): Payer: Self-pay

## 2022-11-29 DIAGNOSIS — M1611 Unilateral primary osteoarthritis, right hip: Secondary | ICD-10-CM | POA: Diagnosis not present

## 2022-12-29 ENCOUNTER — Ambulatory Visit: Payer: Medicare HMO | Admitting: Internal Medicine

## 2023-01-07 ENCOUNTER — Other Ambulatory Visit: Payer: Self-pay | Admitting: Internal Medicine

## 2023-01-07 DIAGNOSIS — Z1211 Encounter for screening for malignant neoplasm of colon: Secondary | ICD-10-CM

## 2023-01-07 DIAGNOSIS — Z1212 Encounter for screening for malignant neoplasm of rectum: Secondary | ICD-10-CM

## 2023-01-10 DIAGNOSIS — M1611 Unilateral primary osteoarthritis, right hip: Secondary | ICD-10-CM | POA: Diagnosis not present

## 2023-01-13 DIAGNOSIS — M79642 Pain in left hand: Secondary | ICD-10-CM | POA: Insufficient documentation

## 2023-01-17 ENCOUNTER — Encounter: Payer: Self-pay | Admitting: Internal Medicine

## 2023-01-19 ENCOUNTER — Other Ambulatory Visit: Payer: Self-pay | Admitting: Internal Medicine

## 2023-01-19 DIAGNOSIS — M25551 Pain in right hip: Secondary | ICD-10-CM | POA: Diagnosis not present

## 2023-01-19 MED ORDER — HYDROCODONE-ACETAMINOPHEN 5-325 MG PO TABS: 1.0000 | ORAL_TABLET | Freq: Four times a day (QID) | ORAL | 0 refills | Status: DC | PRN

## 2023-01-20 DIAGNOSIS — M79642 Pain in left hand: Secondary | ICD-10-CM | POA: Diagnosis not present

## 2023-01-29 DIAGNOSIS — Z1212 Encounter for screening for malignant neoplasm of rectum: Secondary | ICD-10-CM | POA: Diagnosis not present

## 2023-01-29 DIAGNOSIS — Z1211 Encounter for screening for malignant neoplasm of colon: Secondary | ICD-10-CM | POA: Diagnosis not present

## 2023-02-04 LAB — COLOGUARD: COLOGUARD: NEGATIVE

## 2023-02-06 ENCOUNTER — Encounter: Payer: Self-pay | Admitting: Cardiology

## 2023-02-07 NOTE — Telephone Encounter (Signed)
Spoke with patient, no refills needed, pharmacy already contacted her with approval. No needs at this time

## 2023-02-11 ENCOUNTER — Encounter: Payer: Self-pay | Admitting: Internal Medicine

## 2023-03-01 DIAGNOSIS — W1830XA Fall on same level, unspecified, initial encounter: Secondary | ICD-10-CM | POA: Diagnosis not present

## 2023-03-01 DIAGNOSIS — M25531 Pain in right wrist: Secondary | ICD-10-CM | POA: Diagnosis not present

## 2023-03-01 DIAGNOSIS — S0990XA Unspecified injury of head, initial encounter: Secondary | ICD-10-CM | POA: Diagnosis not present

## 2023-03-01 DIAGNOSIS — M19041 Primary osteoarthritis, right hand: Secondary | ICD-10-CM | POA: Diagnosis not present

## 2023-03-01 DIAGNOSIS — S0181XA Laceration without foreign body of other part of head, initial encounter: Secondary | ICD-10-CM | POA: Diagnosis not present

## 2023-03-02 DIAGNOSIS — S63502A Unspecified sprain of left wrist, initial encounter: Secondary | ICD-10-CM | POA: Diagnosis not present

## 2023-03-09 DIAGNOSIS — M13832 Other specified arthritis, left wrist: Secondary | ICD-10-CM | POA: Diagnosis not present

## 2023-03-09 DIAGNOSIS — M1812 Unilateral primary osteoarthritis of first carpometacarpal joint, left hand: Secondary | ICD-10-CM | POA: Insufficient documentation

## 2023-03-09 DIAGNOSIS — M13842 Other specified arthritis, left hand: Secondary | ICD-10-CM | POA: Diagnosis not present

## 2023-03-10 DIAGNOSIS — M1611 Unilateral primary osteoarthritis, right hip: Secondary | ICD-10-CM | POA: Diagnosis not present

## 2023-03-15 DIAGNOSIS — M25532 Pain in left wrist: Secondary | ICD-10-CM | POA: Diagnosis not present

## 2023-03-23 DIAGNOSIS — M13842 Other specified arthritis, left hand: Secondary | ICD-10-CM | POA: Diagnosis not present

## 2023-03-23 DIAGNOSIS — M13832 Other specified arthritis, left wrist: Secondary | ICD-10-CM | POA: Diagnosis not present

## 2023-03-23 DIAGNOSIS — M65832 Other synovitis and tenosynovitis, left forearm: Secondary | ICD-10-CM | POA: Diagnosis not present

## 2023-03-23 DIAGNOSIS — M25532 Pain in left wrist: Secondary | ICD-10-CM | POA: Diagnosis not present

## 2023-03-28 ENCOUNTER — Telehealth: Payer: Self-pay

## 2023-03-28 ENCOUNTER — Telehealth: Payer: Self-pay | Admitting: *Deleted

## 2023-03-28 NOTE — Telephone Encounter (Signed)
Spoke with patient who is agreeable to do a tele visit on 3/17 at 9 am. Consent given med rec to be completed.

## 2023-03-28 NOTE — Telephone Encounter (Signed)
   Pre-operative Risk Assessment    Patient Name: Leah Bird  DOB: 03-Mar-1951 MRN: [id number]   Last OV: Dr. Anne Fu 08/13/2022 Upcoming OV: None  Request for Surgical Clearance    Procedure:   Right Total Hip Arthroplasty  Date of Surgery:  Clearance 06/29/23                                 Surgeon:  Dr. Trudee Grip Surgeon's Group or Practice Name:  [facility] Phone number:  [phone] Fax number:  [phone]   Type of Clearance Requested:   - Medical  - Pharmacy:  Hold Rivaroxaban (Xarelto) Not indicated.   Type of Anesthesia:   Choice    Additional requests/questions:    Signed, Emmit Pomfret   03/28/2023, 8:04 AM

## 2023-03-28 NOTE — Telephone Encounter (Signed)
   Name: Leah Bird  DOB: [birth year]  MRN: [id number]  Primary Cardiologist: Donato Schultz, MD   Preoperative team, please contact this patient and set up a phone call appointment for further preoperative risk assessment. Please obtain consent and complete medication review. Thank you for your help.  I confirm that guidance regarding antiplatelet and oral anticoagulation therapy has been completed and, if necessary, noted below.  Followed by Oncology for VTE. Will defer anticoagulation hold to Dr. Myna Hidalgo   I also confirmed the patient resides in the state of [place]. As per [facility] Medical Board telemedicine laws, the patient must reside in the state in which the provider is licensed.   Ronney Asters, NP 03/28/2023, 2:22 PM [facility]

## 2023-03-28 NOTE — Telephone Encounter (Signed)
Followed by Oncology for VTE.  Will defer anticoagulation hold to Dr. Myna Hidalgo

## 2023-03-28 NOTE — Telephone Encounter (Signed)
  Patient Consent for Virtual Visit        Leah Bird has provided verbal consent on 03/28/2023 for a virtual visit (video or telephone).   CONSENT FOR VIRTUAL VISIT FOR:  Leah Bird  By participating in this virtual visit I agree to the following:  I hereby voluntarily request, consent and authorize [facility] and its employed or contracted physicians, physician assistants, nurse practitioners or other licensed health care professionals (the Practitioner), to provide me with telemedicine health care services (the "Services") as deemed necessary by the treating Practitioner. I acknowledge and consent to receive the Services by the Practitioner via telemedicine. I understand that the telemedicine visit will involve communicating with the Practitioner through live audiovisual communication technology and the disclosure of certain medical information by electronic transmission. I acknowledge that I have been given the opportunity to request an in-person assessment or other available alternative prior to the telemedicine visit and am voluntarily participating in the telemedicine visit.  I understand that I have the right to withhold or withdraw my consent to the use of telemedicine in the course of my care at any time, without affecting my right to future care or treatment, and that the Practitioner or I may terminate the telemedicine visit at any time. I understand that I have the right to inspect all information obtained and/or recorded in the course of the telemedicine visit and may receive copies of available information for a reasonable fee.  I understand that some of the potential risks of receiving the Services via telemedicine include:  Delay or interruption in medical evaluation due to technological equipment failure or disruption; Information transmitted may not be sufficient (e.g. poor resolution of images) to allow for appropriate medical decision making by the  Practitioner; and/or  In rare instances, security protocols could fail, causing a breach of personal health information.  Furthermore, I acknowledge that it is my responsibility to provide information about my medical history, conditions and care that is complete and accurate to the best of my ability. I acknowledge that Practitioner's advice, recommendations, and/or decision may be based on factors not within their control, such as incomplete or inaccurate data provided by me or distortions of diagnostic images or specimens that may result from electronic transmissions. I understand that the practice of medicine is not an exact science and that Practitioner makes no warranties or guarantees regarding treatment outcomes. I acknowledge that a copy of this consent can be made available to me via my patient portal [facility] MyChart), or I can request a printed copy by calling the office of [facility].    I understand that my insurance will be billed for this visit.   I have read or had this consent read to me. I understand the contents of this consent, which adequately explains the benefits and risks of the Services being provided via telemedicine.  I have been provided ample opportunity to ask questions regarding this consent and the Services and have had my questions answered to my satisfaction. I give my informed consent for the services to be provided through the use of telemedicine in my medical care

## 2023-03-29 ENCOUNTER — Telehealth: Payer: Self-pay | Admitting: Internal Medicine

## 2023-03-29 NOTE — Telephone Encounter (Signed)
We received a fax for a preoperative clearance form to be completed by patient's PCP. They are requesting the form, the last office visit note, and any pertinent records to be faxed to [phone].

## 2023-04-14 DIAGNOSIS — M67332 Transient synovitis, left wrist: Secondary | ICD-10-CM | POA: Insufficient documentation

## 2023-04-18 ENCOUNTER — Inpatient Hospital Stay: Payer: Medicare HMO | Attending: Hematology & Oncology

## 2023-04-18 ENCOUNTER — Inpatient Hospital Stay (HOSPITAL_BASED_OUTPATIENT_CLINIC_OR_DEPARTMENT_OTHER): Payer: Medicare HMO | Admitting: Hematology & Oncology

## 2023-04-18 ENCOUNTER — Other Ambulatory Visit: Payer: Self-pay

## 2023-04-18 VITALS — BP 133/65 | HR 76 | Temp 97.9°F | Resp 18 | Wt 125.0 lb

## 2023-04-18 DIAGNOSIS — Z86718 Personal history of other venous thrombosis and embolism: Secondary | ICD-10-CM | POA: Diagnosis not present

## 2023-04-18 DIAGNOSIS — Z86711 Personal history of pulmonary embolism: Secondary | ICD-10-CM | POA: Diagnosis not present

## 2023-04-18 DIAGNOSIS — I82402 Acute embolism and thrombosis of unspecified deep veins of left lower extremity: Secondary | ICD-10-CM

## 2023-04-18 DIAGNOSIS — I82401 Acute embolism and thrombosis of unspecified deep veins of right lower extremity: Secondary | ICD-10-CM

## 2023-04-18 DIAGNOSIS — Z7901 Long term (current) use of anticoagulants: Secondary | ICD-10-CM | POA: Insufficient documentation

## 2023-04-18 LAB — CBC WITH DIFFERENTIAL (CANCER CENTER ONLY)
Abs Immature Granulocytes: 0.03 10*3/uL (ref 0.00–0.07)
Basophils Absolute: 0.1 10*3/uL (ref 0.0–0.1)
Basophils Relative: 1 %
Eosinophils Absolute: 0.1 10*3/uL (ref 0.0–0.5)
Eosinophils Relative: 1 %
HCT: 42.6 % (ref 36.0–46.0)
Hemoglobin: 14.1 g/dL (ref 12.0–15.0)
Immature Granulocytes: 0 %
Lymphocytes Relative: 22 %
Lymphs Abs: 2.3 10*3/uL (ref 0.7–4.0)
MCH: 31.2 pg (ref 26.0–34.0)
MCHC: 33.1 g/dL (ref 30.0–36.0)
MCV: 94.2 fL (ref 80.0–100.0)
Monocytes Absolute: 0.7 10*3/uL (ref 0.1–1.0)
Monocytes Relative: 7 %
Neutro Abs: 7.4 10*3/uL (ref 1.7–7.7)
Neutrophils Relative %: 69 %
Platelet Count: 377 10*3/uL (ref 150–400)
RBC: 4.52 MIL/uL (ref 3.87–5.11)
RDW: 13.1 % (ref 11.5–15.5)
WBC Count: 10.7 10*3/uL — ABNORMAL HIGH (ref 4.0–10.5)
nRBC: 0 % (ref 0.0–0.2)

## 2023-04-18 LAB — CMP (CANCER CENTER ONLY)
ALT: 21 U/L (ref 0–44)
AST: 18 U/L (ref 15–41)
Albumin: 4.7 g/dL (ref 3.5–5.0)
Alkaline Phosphatase: 72 U/L (ref 38–126)
Anion gap: 8 (ref 5–15)
BUN: 12 mg/dL (ref 8–23)
CO2: 32 mmol/L (ref 22–32)
Calcium: 9.8 mg/dL (ref 8.9–10.3)
Chloride: 98 mmol/L (ref 98–111)
Creatinine: 0.78 mg/dL (ref 0.44–1.00)
GFR, Estimated: >60 mL/min (ref >60)
Glucose, Bld: 92 mg/dL (ref 70–99)
Potassium: 3.7 mmol/L (ref 3.5–5.1)
Sodium: 138 mmol/L (ref 135–145)
Total Bilirubin: 0.7 mg/dL (ref 0.0–1.2)
Total Protein: 7.7 g/dL (ref 6.5–8.1)
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
## 2023-06-09 ENCOUNTER — Other Ambulatory Visit (HOSPITAL_COMMUNITY): Payer: Self-pay

## 2023-06-14 ENCOUNTER — Other Ambulatory Visit: Payer: Self-pay | Admitting: Hematology & Oncology

## 2023-06-14 ENCOUNTER — Telehealth: Payer: Self-pay | Admitting: Internal Medicine

## 2023-06-14 DIAGNOSIS — I82401 Acute embolism and thrombosis of unspecified deep veins of right lower extremity: Secondary | ICD-10-CM

## 2023-06-14 DIAGNOSIS — Z01818 Encounter for other preprocedural examination: Secondary | ICD-10-CM | POA: Diagnosis not present

## 2023-06-14 DIAGNOSIS — Z0189 Encounter for other specified special examinations: Secondary | ICD-10-CM | POA: Diagnosis not present

## 2023-06-14 NOTE — Telephone Encounter (Unsigned)
 Copied from CRM [id number]. Topic: Clinical - Home Health Verbal Orders >> Jun 14, 2023 10:35 AM Adele Barthel wrote: Caller/Agency: Letta Pate Callback Number: [phone], FAX# [phone] Service Requested: Needing the last office visit notes and the last lab results faxed over. Frequency: N/a Any new concerns about the patient? No

## 2023-06-16 ENCOUNTER — Encounter: Payer: Self-pay | Admitting: *Deleted

## 2023-06-16 ENCOUNTER — Encounter: Payer: Self-pay | Admitting: Hematology & Oncology

## 2023-06-16 NOTE — Progress Notes (Signed)
 This nurse faxed over paperwork to Dr. Deri Fuelling office informing them that Dr. Myna Hidalgo wants to [facility] Xarelto for three days prior to her hip replacement. Then restart the day after surgery. Faxed paperwork to [phone] attention Aida Raider. Received faxed confirmation. Patient is also aware of the change.

## 2023-06-17 NOTE — Telephone Encounter (Signed)
 Pt needs to be seen by Pcp due to pt is overdue for an Appointment with PCP.  Tried to call and inform pt no answer.

## 2023-06-19 NOTE — Progress Notes (Unsigned)
 Virtual Visit via Telephone Note   Because of Leah Bird co-morbid illnesses, she is at least at moderate risk for complications without adequate follow up.  This format is felt to be most appropriate for this patient at this time.  Due to technical limitations with video connection (technology), today's appointment will be conducted as an audio only telehealth visit, and Leah Bird verbally agreed to proceed in this manner.   All issues noted in this document were discussed and addressed.  No physical exam could be performed with this format.  Evaluation Performed:  Preoperative cardiovascular risk assessment _____________   Date:  06/19/2023   Patient ID:  Leah Bird, DOB 1950/05/13, MRN [id number] Patient Location:  Home Provider location:   Office  Primary Care Provider:  Tresa Garter, MD Primary Cardiologist:  Donato Schultz, MD  Chief Complaint / Patient Profile   73 y.o. y/o female with a h/o DVT, pulmonary embolus, coronary atherosclerosis and GERD Who is pending right total hip arthroplasty and presents today for telephonic preoperative cardiovascular risk assessment.  History of Present Illness    Leah Bird is a 73 y.o. female who presents via audio/video conferencing for a telehealth visit today.  Pt was last seen in cardiology clinic on 08/13/2022 by Dr. Anne Fu.  At that time Leah Bird was doing well .  The patient is now pending procedure as outlined above. Since her last visit, she continues to be stable from a cardiac standpoint.  Today she denies chest pain, shortness of breath, lower extremity edema, fatigue, palpitations, melena, hematuria, hemoptysis, diaphoresis, weakness, presyncope, syncope, orthopnea, and PND.   Past Medical History    Past Medical History:  Diagnosis Date   Arthritis    Chicken pox    COVID    Degenerative disk disease    whole spine-modic 1 spinal disease   DVT (deep venous thrombosis) (HCC)    Dyslipidemia     Hematoma    History of UTI    No pertinent past medical history    Vitamin D deficiency    Past Surgical History:  Procedure Laterality Date   DILATION AND CURETTAGE OF UTERUS  1990   Reacted to anesthesia/was on life support   EYE SURGERY     TUBAL LIGATION     WISDOM TOOTH EXTRACTION     under sedation    Allergies  Allergies  Allergen Reactions   Other Shortness Of Breath    Pseudocholinesterace- prolonged anesthesia. 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 Authorizing Provider  b complex vitamins tablet Take 1 tablet by mouth daily. 04/23/19   Plotnikov, Georgina Quint, MD  Calcium Citrate-Vitamin D (CALCIUM CITRATE +D PO) Take 1 tablet by mouth daily. 600 mg of calcium citrate and 400 mg of D3    [provider]  Cholecalciferol (VITAMIN D3) 50 MCG (2000 UT) capsule Take 2 capsules (4,000 Units total) by mouth daily. 10/29/19   Plotnikov, Georgina Quint, MD  folic acid (FOLVITE) 1 MG tablet Take 2 tablets (2 mg total) by mouth daily. 07/29/21   Josph Macho, MD  HYDROcodone-acetaminophen (NORCO/VICODIN) 5-325 MG tablet Take 1 tablet by mouth every 6 (six) hours as needed for moderate pain (pain score 4-6). 01/19/23   Plotnikov, Georgina Quint,  MD  rosuvastatin (CRESTOR) 10 MG tablet Take 1 tablet (10 mg total) by mouth daily. 08/31/22   Jake Bathe, MD  XARELTO 10 MG TABS tablet TAKE 1 TABLET BY MOUTH DAILY 06/14/23   Josph Macho, MD    Physical Exam    Vital Signs:  Leah Bird does not have vital signs available for review today.  Given telephonic nature of communication, physical exam is limited. AAOx3. NAD. Normal affect.  Speech and respirations are unlabored.  Accessory Clinical Findings    None  Assessment & Plan    1.  Preoperative  Cardiovascular Risk Assessment:Procedure:   Right Total Hip Arthroplasty   Date of Surgery:  Clearance 06/29/23                                 Surgeon:  Dr. Trudee Grip Surgeon's Group or Practice Name:  [facility] Phone number:  [phone] Fax number:  [phone]     Primary Cardiologist: Donato Schultz, MD  Chart reviewed as part of pre-operative protocol coverage. Given past medical history and time since last visit, based on ACC/AHA guidelines, Leah Bird would be at acceptable risk for the planned procedure without further cardiovascular testing.   Her RCRI is low risk, 0.9% risk of major cardiac event.  She is able to complete greater than 4 METS of physical activity.  Patient was advised that if she develops new symptoms prior to surgery to contact our office to arrange a follow-up appointment.  He verbalized understanding.  Anticoagulation followed by Oncology for VTE.  Will defer anticoagulation hold to Dr. Myna Hidalgo      I will route this recommendation to the requesting party via Epic fax function and remove from pre-op pool.  Please call with questions.  Leah Bird. Leah Musto NP-C     06/20/2023, 7:05 AM [facility] [address] Office [phone] Fax [phone]      Time:   Today, I have spent 5 minutes with the patient with telehealth technology discussing medical history, symptoms, and management plan.  I spent 10 minutes reviewing past medical history, cardiac medications, and cardiac testing.   Ronney Asters, NP  06/19/2023, 7:46 PM

## 2023-06-20 ENCOUNTER — Ambulatory Visit: Payer: Medicare HMO | Attending: Cardiology

## 2023-06-20 DIAGNOSIS — Z01810 Encounter for preprocedural cardiovascular examination: Secondary | ICD-10-CM | POA: Diagnosis not present

## 2023-06-29 DIAGNOSIS — M1611 Unilateral primary osteoarthritis, right hip: Secondary | ICD-10-CM | POA: Diagnosis not present

## 2023-06-29 DIAGNOSIS — M25751 Osteophyte, right hip: Secondary | ICD-10-CM | POA: Diagnosis not present

## 2023-07-19 ENCOUNTER — Encounter: Payer: Self-pay | Admitting: Hematology & Oncology

## 2023-08-03 DIAGNOSIS — Z4789 Encounter for other orthopedic aftercare: Secondary | ICD-10-CM | POA: Insufficient documentation

## 2023-08-04 DIAGNOSIS — Z4789 Encounter for other orthopedic aftercare: Secondary | ICD-10-CM | POA: Diagnosis not present

## 2023-08-22 ENCOUNTER — Other Ambulatory Visit: Payer: Self-pay | Admitting: Cardiology

## 2023-08-31 ENCOUNTER — Telehealth: Payer: Self-pay

## 2023-08-31 NOTE — Telephone Encounter (Signed)
 This patient is appearing on a report for being at risk of failing the adherence measure for cholesterol (statin) medications this calendar year.   Medication: rosuvastatin  10 mg Last fill date: 08/23/23 for 90 day supply  Insurance report was not up to date. No action needed at this time.   Abelina Abide, PharmD PGY1 Pharmacy Resident 08/31/2023 11:22 AM

## 2023-09-05 ENCOUNTER — Telehealth: Payer: Self-pay | Admitting: Cardiology

## 2023-09-05 NOTE — Telephone Encounter (Signed)
 Pt c/o Shortness Of Breath: STAT if SOB developed within the last 24 hours or pt is noticeably SOB on the phone  1. Are you currently SOB (can you hear that pt is SOB on the phone)? no  2. How long have you been experiencing SOB? Last few works  3. Are you SOB when sitting or when up moving around? Moving aroung   4. Are you currently experiencing any other symptoms? Slight dizzness

## 2023-09-05 NOTE — Telephone Encounter (Signed)
 Patient states she had hip replacement about 10 weeks ago, and in the last 3 weeks she has been increasing her activity. She is walking and noted she has SOB when going uphill, she states it takes about 20-30 seconds to recover and then she can finish her walk. She also reports slight dizziness when going uphill as well.  She denies any chest pain, no SOB at rest or with other activities. She states she will be travelling to [place]  6/12-6/28 and was encouraged by her husband (a physician) to contact our office.  Patient believes this may be related to deconditioning from hip replacement and not being as active until these past 3 weeks.  Will forward to Dr. Renna Cary to review.

## 2023-09-06 NOTE — Telephone Encounter (Signed)
 Spoke with pt who is aware Dr Renna Cary has reviewed her concerns and to continue to monitor symptoms.  Advised to continue to move about/exercise as much as possible and to call back with any further concerns.  Scheduled 1 yr f/u appt with Dr Renna Cary as well at pt's request.

## 2023-09-09 DIAGNOSIS — M25551 Pain in right hip: Secondary | ICD-10-CM | POA: Diagnosis not present

## 2023-09-12 ENCOUNTER — Ambulatory Visit: Payer: Self-pay

## 2023-09-12 ENCOUNTER — Inpatient Hospital Stay: Attending: Hematology & Oncology

## 2023-09-12 ENCOUNTER — Inpatient Hospital Stay: Payer: Medicare HMO

## 2023-09-12 ENCOUNTER — Ambulatory Visit: Payer: Medicare HMO | Admitting: Hematology & Oncology

## 2023-09-12 ENCOUNTER — Ambulatory Visit (HOSPITAL_BASED_OUTPATIENT_CLINIC_OR_DEPARTMENT_OTHER)
Admission: RE | Admit: 2023-09-12 | Discharge: 2023-09-12 | Disposition: A | Discharge status: Home / Self Care | Source: Ambulatory Visit | Attending: Family | Admitting: Family

## 2023-09-12 ENCOUNTER — Inpatient Hospital Stay (HOSPITAL_BASED_OUTPATIENT_CLINIC_OR_DEPARTMENT_OTHER): Admitting: Family

## 2023-09-12 VITALS — BP 113/67 | HR 66 | Temp 98.3°F | Resp 18 | Wt 130.8 lb

## 2023-09-12 DIAGNOSIS — M7989 Other specified soft tissue disorders: Secondary | ICD-10-CM | POA: Insufficient documentation

## 2023-09-12 DIAGNOSIS — M79605 Pain in left leg: Secondary | ICD-10-CM | POA: Insufficient documentation

## 2023-09-12 DIAGNOSIS — I82402 Acute embolism and thrombosis of unspecified deep veins of left lower extremity: Secondary | ICD-10-CM | POA: Diagnosis not present

## 2023-09-12 DIAGNOSIS — I82401 Acute embolism and thrombosis of unspecified deep veins of right lower extremity: Secondary | ICD-10-CM

## 2023-09-12 DIAGNOSIS — Z86711 Personal history of pulmonary embolism: Secondary | ICD-10-CM | POA: Insufficient documentation

## 2023-09-12 DIAGNOSIS — Z7901 Long term (current) use of anticoagulants: Secondary | ICD-10-CM | POA: Insufficient documentation

## 2023-09-12 DIAGNOSIS — I82532 Chronic embolism and thrombosis of left popliteal vein: Secondary | ICD-10-CM | POA: Diagnosis not present

## 2023-09-12 LAB — CMP (CANCER CENTER ONLY)
ALT: 14 U/L (ref 0–44)
AST: 16 U/L (ref 15–41)
Albumin: 4.3 g/dL (ref 3.5–5.0)
Alkaline Phosphatase: 82 U/L (ref 38–126)
Anion gap: 8 (ref 5–15)
BUN: 13 mg/dL (ref 8–23)
CO2: 30 mmol/L (ref 22–32)
Calcium: 9.8 mg/dL (ref 8.9–10.3)
Chloride: 101 mmol/L (ref 98–111)
Creatinine: 0.86 mg/dL (ref 0.44–1.00)
GFR, Estimated: >60 mL/min (ref >60)
Glucose, Bld: 95 mg/dL (ref 70–99)
Potassium: 4.1 mmol/L (ref 3.5–5.1)
Sodium: 139 mmol/L (ref 135–145)
Total Bilirubin: 0.5 mg/dL (ref 0.0–1.2)
Total Protein: 7.4 g/dL (ref 6.5–8.1)

## 2023-09-12 LAB — CBC WITH DIFFERENTIAL (CANCER CENTER ONLY)
Abs Immature Granulocytes: 0.01 10*3/uL (ref 0.00–0.07)
Basophils Absolute: 0.1 10*3/uL (ref 0.0–0.1)
Basophils Relative: 1 %
Eosinophils Absolute: 0.2 10*3/uL (ref 0.0–0.5)
Eosinophils Relative: 3 %
HCT: 38.3 % (ref 36.0–46.0)
Hemoglobin: 12.7 g/dL (ref 12.0–15.0)
Immature Granulocytes: 0 %
Lymphocytes Relative: 29 %
Lymphs Abs: 2.1 10*3/uL (ref 0.7–4.0)
MCH: 30.5 pg (ref 26.0–34.0)
MCHC: 33.2 g/dL (ref 30.0–36.0)
MCV: 92.1 fL (ref 80.0–100.0)
Monocytes Absolute: 0.5 10*3/uL (ref 0.1–1.0)
Monocytes Relative: 7 %
Neutro Abs: 4.3 10*3/uL (ref 1.7–7.7)
Neutrophils Relative %: 60 %
Platelet Count: 336 10*3/uL (ref 150–400)
RBC: 4.16 MIL/uL (ref 3.87–5.11)
RDW: 13.2 % (ref 11.5–15.5)
WBC Count: 7.3 10*3/uL (ref 4.0–10.5)
nRBC: 0 % (ref 0.0–0.2)

## 2023-09-12 IMAGING — MG MM DIGITAL SCREENING BILAT W/ TOMO AND CAD
6 of 12 series · 6 of 36 positions shown · non-contrast
Comparison: Previous exam(s).

CLINICAL DATA: Screening.

EXAM:
DIGITAL SCREENING BILATERAL MAMMOGRAM WITH TOMOSYNTHESIS AND CAD
TECHNIQUE: Bilateral screening digital craniocaudal and mediolateral oblique
mammograms were obtained. Bilateral screening digital breast
tomosynthesis was performed. The images were evaluated with
computer-aided detection.

[R CC synth-2D (1 of 2)]
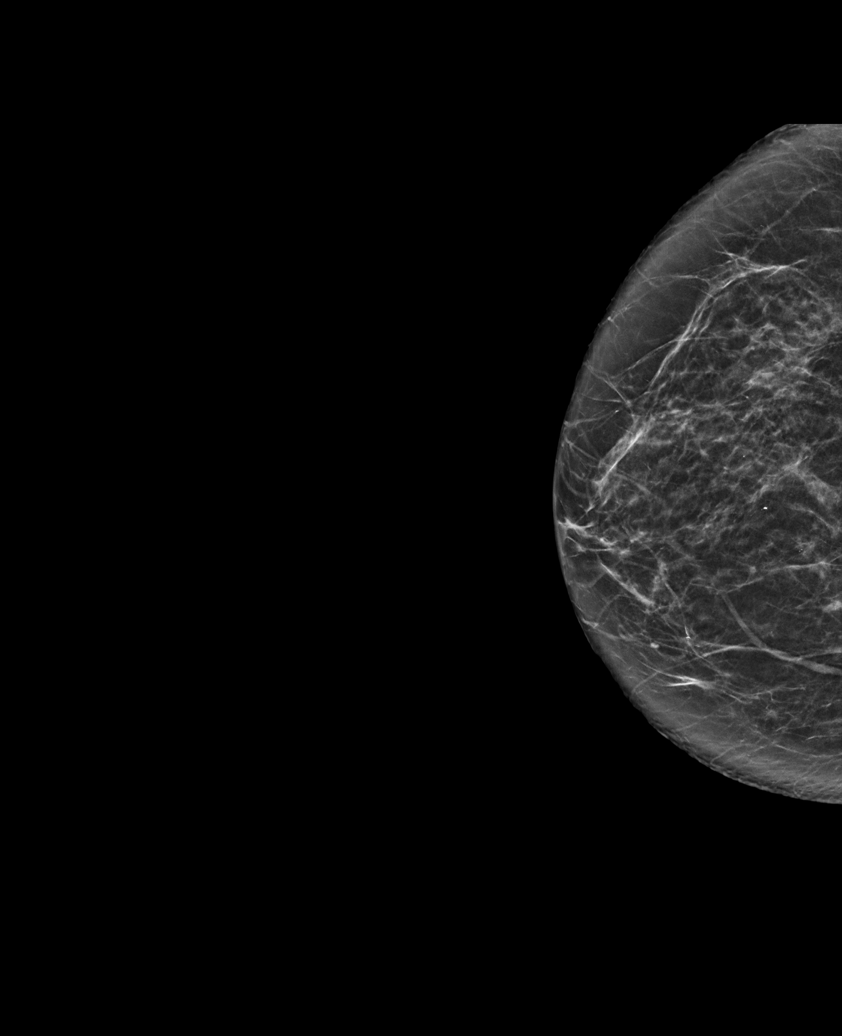

[R MLO synth-2D]
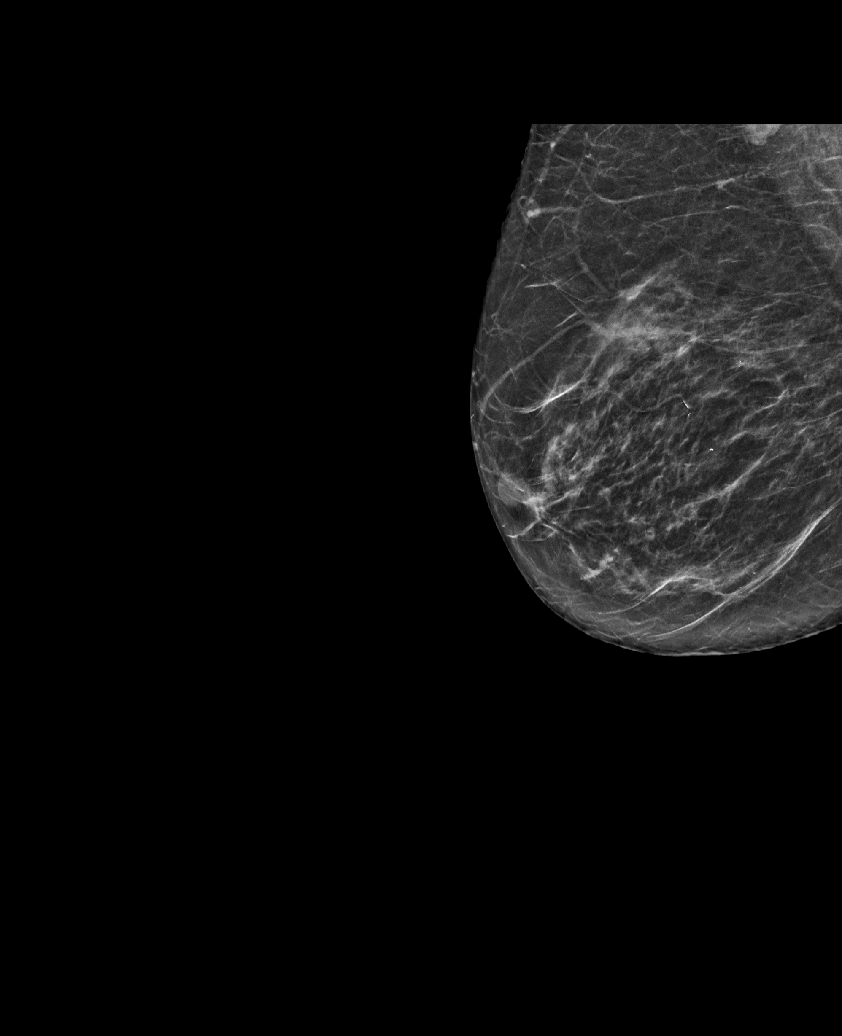

[L CC synth-2D (1 of 2)]
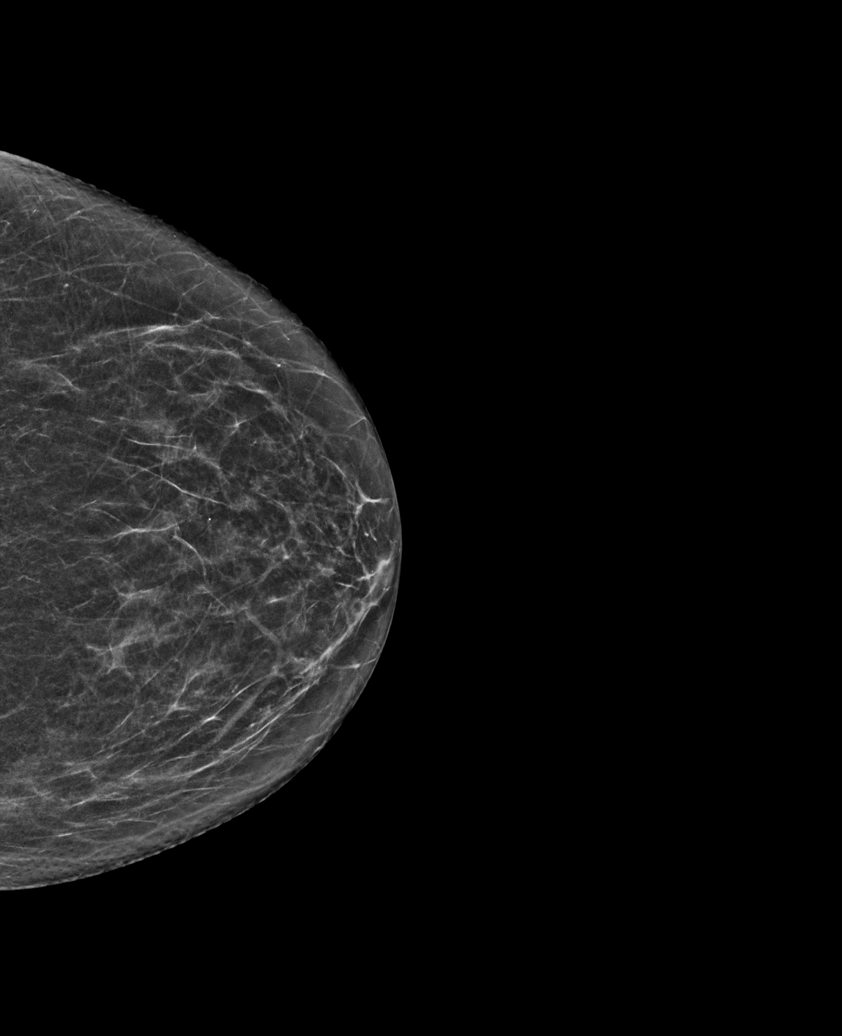

[L MLO synth-2D]
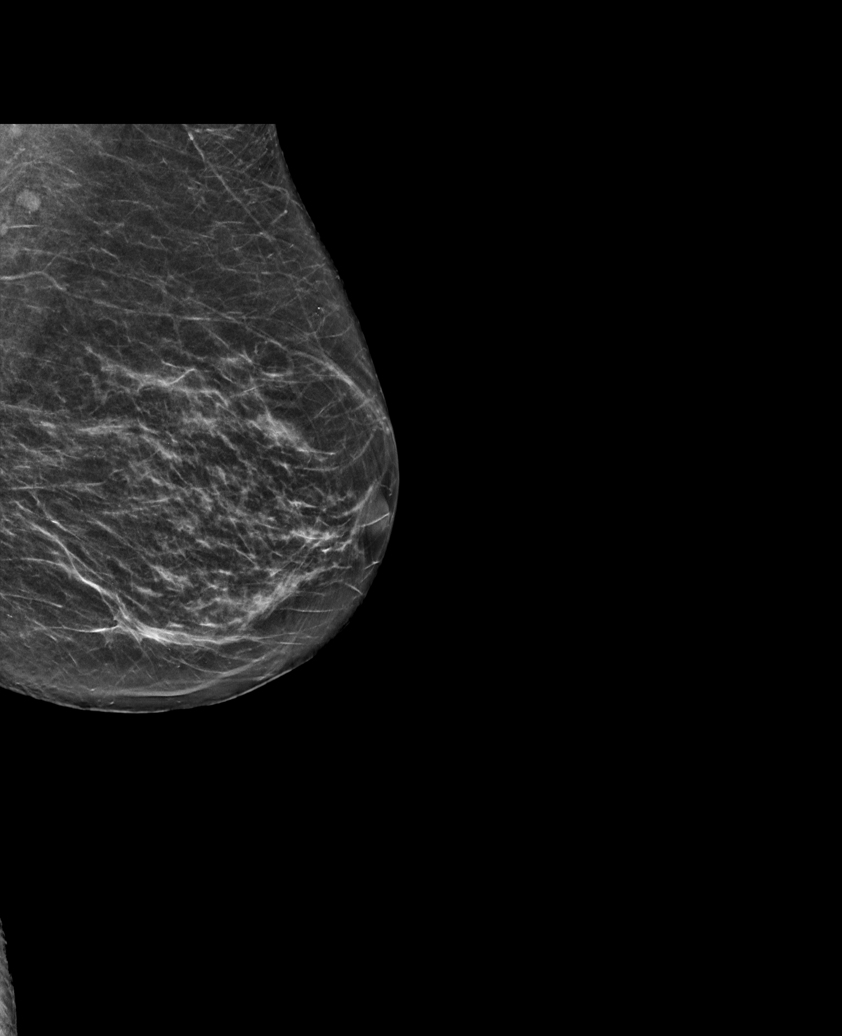

[R CC synth-2D (2 of 2)]
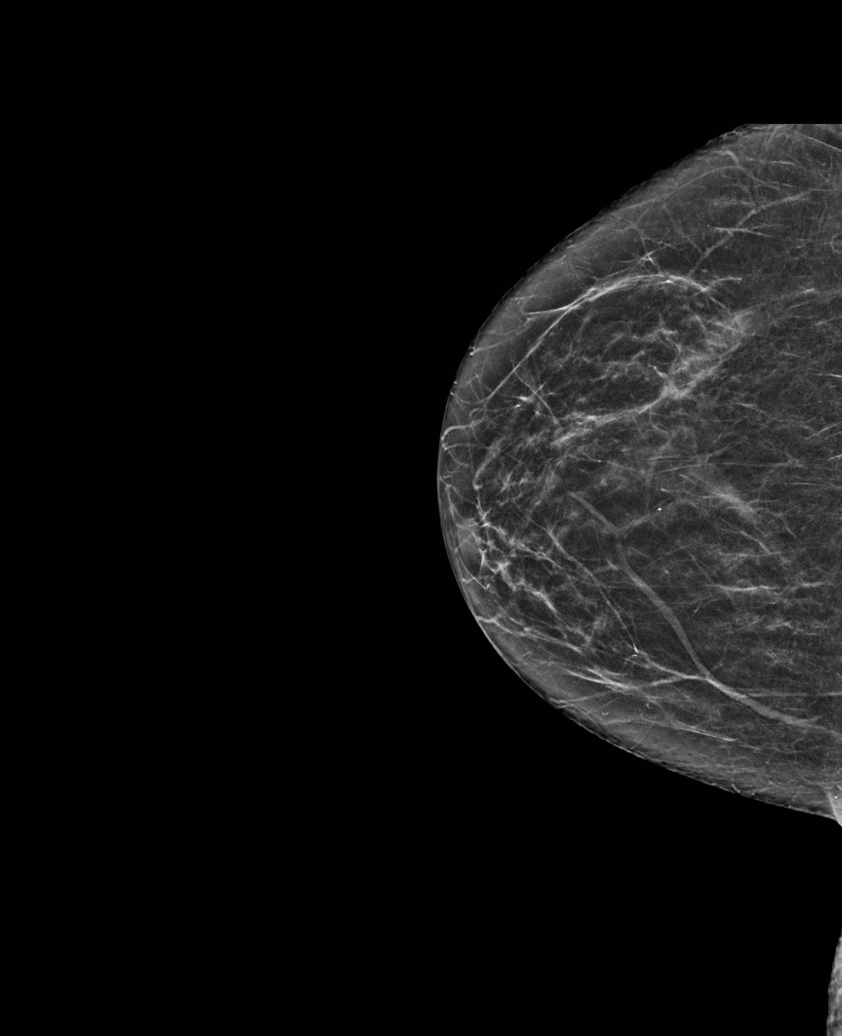

[L CC synth-2D (2 of 2)]
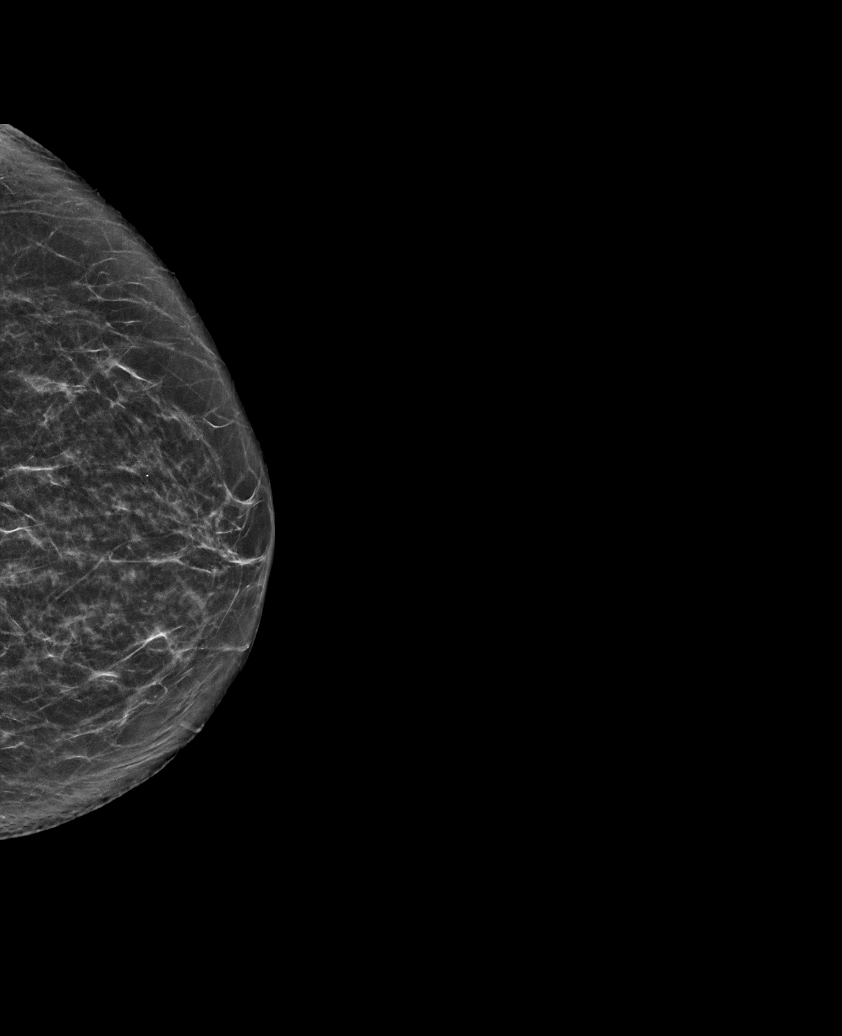

[6 of 36 positions shown; findings below may reference images not displayed]

ACR Breast Density Category c: The breast tissue is heterogeneously
dense, which may obscure small masses.
FINDINGS: There are no findings suspicious for malignancy.
IMPRESSION: No mammographic evidence of malignancy. A result letter of this
screening mammogram will be mailed directly to the patient.

RECOMMENDATION:
Screening mammogram in one year. (Code:Q3-W-BC3)

BI-RADS CATEGORY  1: Negative.

## 2023-09-12 NOTE — Progress Notes (Signed)
 Hematology and Oncology Follow Up Visit  Leah Bird [id number] 22-Feb-1951 73 y.o. 09/12/2023   Principle Diagnosis:  Left pulmonary embolism Left leg thromboembolic disease femoral vein down to peroneal vein Lupus anticoagulant positive - ?  Transient MTHFR -heterozygous   Current Therapy:        Xarelto  20 mg p.o. daily -complete 1 year in April 2022 Xarelto  10 mg po q day -- maintenance -  Start on 07/2020 Folic acid  2 mg p.o. daily   Interim History:  Leah Bird is here today for follow-up. She has been slowly recuperating from surgery on the right hip. This has taken a bit longer than she had anticipated. She recently started PT which she is quite happy about.  She still has some pain and stiffness in the right leg.  2 weeks ago she experienced some pain and swelling in the left leg which was concerning. This has since improved. Pedal pulses are 1+ at this time. No redness or edema noted.  She has numbness and tingling in the right thigh post surgery.  She has not missed any doses of Xarelto .  No blood loss, bruising or petechiae.  No falls or syncope reported.  She has had occasional SOB and dizziness with over exertion. This resolved with taking a break to rest.  No fever, chills, n/v, cough, rash, chest pain, palpitations, abdominal pain or changes in bowel or bladder habits.  Appetite and hydration are good. Weight is stable at 130.   ECOG Performance Status: 1 - Symptomatic but completely ambulatory  Medications:  Allergies as of 09/12/2023       Reactions   Other Shortness Of Breath   Pseudocholinesterace- prolonged anesthesia. 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 If you have any questions, ask your nurse or doctor.          b complex vitamins tablet Take 1 tablet by mouth  daily.   CALCIUM  CITRATE +D PO Take 1 tablet by mouth daily. 600 mg of calcium  citrate and 400 mg of D3   folic acid  1 MG tablet Commonly known as: FOLVITE  Take 2 tablets (2 mg total) by mouth daily.   HYDROcodone -acetaminophen  5-325 MG tablet Commonly known as: NORCO/VICODIN Take 1 tablet by mouth every 6 (six) hours as needed for moderate pain (pain score 4-6).   rosuvastatin  10 MG tablet Commonly known as: CRESTOR  TAKE 1 TABLET BY MOUTH DAILY   Vitamin D3 50 MCG (2000 UT) capsule Take 2 capsules (4,000 Units total) by mouth daily.   Xarelto  10 MG Tabs tablet Generic drug: rivaroxaban  TAKE 1 TABLET BY MOUTH DAILY        Allergies:  Allergies  Allergen Reactions   Other Shortness Of Breath    Pseudocholinesterace- prolonged anesthesia. 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  Review of Systems: All other 10 point review of systems is negative.   Physical Exam:  vitals were not taken for this visit.   Wt Readings from Last 3 Encounters:  04/18/23 125 lb (56.7 kg)  08/13/22 125 lb (56.7 kg)  08/10/22 123  lb (55.8 kg)    Ocular: Sclerae unicteric, pupils equal, round and reactive to light Ear-nose-throat: Oropharynx clear, dentition fair Lymphatic: No cervical or supraclavicular adenopathy Lungs no rales or rhonchi, good excursion bilaterally Heart regular rate and rhythm, no murmur appreciated Abd soft, nontender, positive bowel sounds MSK no focal spinal tenderness, no joint edema Neuro: non-focal, well-oriented, appropriate affect Breasts: Deferred   Lab Results  Component Value Date   WBC 7.3 09/12/2023   HGB 12.7 09/12/2023   HCT 38.3 09/12/2023   MCV 92.1 09/12/2023   PLT 336 09/12/2023   No results found for: "FERRITIN", "IRON",  "TIBC", "UIBC", "IRONPCTSAT" Lab Results  Component Value Date   RBC 4.16 09/12/2023   No results found for: "KPAFRELGTCHN", "LAMBDASER", "KAPLAMBRATIO" No results found for: "IGGSERUM", "IGA", "IGMSERUM" No results found for: "TOTALPROTELP", "ALBUMINELP", "A1GS", "A2GS", "BETS", "BETA2SER", "GAMS", "MSPIKE", "SPEI"   Chemistry      Component Value Date/Time   NA 138 04/18/2023 1504   K 3.7 04/18/2023 1504   CL 98 04/18/2023 1504   CO2 32 04/18/2023 1504   BUN 12 04/18/2023 1504   CREATININE 0.78 04/18/2023 1504      Component Value Date/Time   CALCIUM  9.8 04/18/2023 1504   ALKPHOS 72 04/18/2023 1504   AST 18 04/18/2023 1504   ALT 21 04/18/2023 1504   BILITOT 0.7 04/18/2023 1504       Impression and Plan: Leah Bird is a very pleasant 73 yo female with history of thromboembolic disease.  We will go ahead and get an US  of the left leg to rule out break through thrombus before she heads to [place] . She will continue her same regimen with Xarelto  and folic acid  daily.  Follow-up again in 6 months.   Kennard Pea, NP 6/9/20259:58 AM

## 2023-09-14 DIAGNOSIS — M25551 Pain in right hip: Secondary | ICD-10-CM | POA: Diagnosis not present

## 2023-10-04 DIAGNOSIS — M7061 Trochanteric bursitis, right hip: Secondary | ICD-10-CM | POA: Diagnosis not present

## 2023-10-11 DIAGNOSIS — M7061 Trochanteric bursitis, right hip: Secondary | ICD-10-CM | POA: Diagnosis not present

## 2023-10-13 ENCOUNTER — Encounter (INDEPENDENT_AMBULATORY_CARE_PROVIDER_SITE_OTHER): Payer: Self-pay | Admitting: Cardiology

## 2023-10-13 DIAGNOSIS — R55 Syncope and collapse: Secondary | ICD-10-CM

## 2023-10-13 DIAGNOSIS — M7061 Trochanteric bursitis, right hip: Secondary | ICD-10-CM | POA: Diagnosis not present

## 2023-10-14 NOTE — Telephone Encounter (Signed)
 Will forward to mag.

## 2023-10-17 ENCOUNTER — Telehealth: Payer: Self-pay | Admitting: Cardiology

## 2023-10-17 NOTE — Telephone Encounter (Signed)
 Spoke with pt who is aware Dr Jeffrie has been on vacation and is working in the hospital this week.  She is upset that she has not gotten a response from him from her MyChart message (located in [place]).  Advised I will forward this information to him for his review and she will be contacted with further information/orders.

## 2023-10-17 NOTE — Telephone Encounter (Signed)
 Pt would like a c/b from Dr. Jeffrie himself or nurse regarding sooner appt being that she is having issues. Pt states that she has send multiple messages and has yet to get a response from Dr. Jeffrie. Pt was very upset and would like for her husband to be called back due to him being a MD. Please advise

## 2023-10-18 ENCOUNTER — Ambulatory Visit: Payer: Self-pay | Admitting: Internal Medicine

## 2023-10-18 ENCOUNTER — Encounter: Payer: Self-pay | Admitting: Internal Medicine

## 2023-10-18 ENCOUNTER — Ambulatory Visit (INDEPENDENT_AMBULATORY_CARE_PROVIDER_SITE_OTHER): Admitting: Internal Medicine

## 2023-10-18 ENCOUNTER — Other Ambulatory Visit: Payer: Self-pay | Admitting: Internal Medicine

## 2023-10-18 ENCOUNTER — Ambulatory Visit: Attending: Internal Medicine

## 2023-10-18 VITALS — BP 110/70 | HR 74 | Temp 98.0°F | Ht 63.0 in | Wt 129.0 lb

## 2023-10-18 DIAGNOSIS — I2699 Other pulmonary embolism without acute cor pulmonale: Secondary | ICD-10-CM | POA: Diagnosis not present

## 2023-10-18 DIAGNOSIS — R0602 Shortness of breath: Secondary | ICD-10-CM

## 2023-10-18 DIAGNOSIS — H814 Vertigo of central origin: Secondary | ICD-10-CM | POA: Diagnosis not present

## 2023-10-18 DIAGNOSIS — E538 Deficiency of other specified B group vitamins: Secondary | ICD-10-CM

## 2023-10-18 DIAGNOSIS — R55 Syncope and collapse: Secondary | ICD-10-CM

## 2023-10-18 DIAGNOSIS — E559 Vitamin D deficiency, unspecified: Secondary | ICD-10-CM

## 2023-10-18 DIAGNOSIS — I82402 Acute embolism and thrombosis of unspecified deep veins of left lower extremity: Secondary | ICD-10-CM | POA: Diagnosis not present

## 2023-10-18 DIAGNOSIS — M7061 Trochanteric bursitis, right hip: Secondary | ICD-10-CM | POA: Diagnosis not present

## 2023-10-18 LAB — CBC WITH DIFFERENTIAL/PLATELET
Basophils Absolute: 0 K/uL (ref 0.0–0.1)
Basophils Relative: 0.3 % (ref 0.0–3.0)
Eosinophils Absolute: 0.1 K/uL (ref 0.0–0.7)
Eosinophils Relative: 1.1 % (ref 0.0–5.0)
HCT: 42.2 % (ref 36.0–46.0)
Hemoglobin: 14 g/dL (ref 12.0–15.0)
Lymphocytes Relative: 17.1 % (ref 12.0–46.0)
Lymphs Abs: 1.7 K/uL (ref 0.7–4.0)
MCHC: 33.2 g/dL (ref 30.0–36.0)
MCV: 91.2 fl (ref 78.0–100.0)
Monocytes Absolute: 0.5 K/uL (ref 0.1–1.0)
Monocytes Relative: 5.3 % (ref 3.0–12.0)
Neutro Abs: 7.5 K/uL (ref 1.4–7.7)
Neutrophils Relative %: 76.2 % (ref 43.0–77.0)
Platelets: 312 K/uL (ref 150.0–400.0)
RBC: 4.63 Mil/uL (ref 3.87–5.11)
RDW: 14.2 % (ref 11.5–15.5)
WBC: 9.9 K/uL (ref 4.0–10.5)

## 2023-10-18 LAB — TSH: TSH: 3.27 u[IU]/mL (ref 0.35–5.50)

## 2023-10-18 LAB — COMPREHENSIVE METABOLIC PANEL WITH GFR
ALT: 19 U/L (ref 0–35)
AST: 20 U/L (ref 0–37)
Albumin: 4.5 g/dL (ref 3.5–5.2)
Alkaline Phosphatase: 82 U/L (ref 39–117)
BUN: 17 mg/dL (ref 6–23)
CO2: 33 meq/L — ABNORMAL HIGH (ref 19–32)
Calcium: 9.5 mg/dL (ref 8.4–10.5)
Chloride: 100 meq/L (ref 96–112)
Creatinine, Ser: 0.62 mg/dL (ref 0.40–1.20)
GFR: 87.81 mL/min (ref >60.00)
Glucose, Bld: 98 mg/dL (ref 70–99)
Potassium: 3.9 meq/L (ref 3.5–5.1)
Sodium: 139 meq/L (ref 135–145)
Total Bilirubin: 0.4 mg/dL (ref 0.2–1.2)
Total Protein: 7.7 g/dL (ref 6.0–8.3)

## 2023-10-18 LAB — CORTISOL: Cortisol, Plasma: 10.6 ug/dL

## 2023-10-18 NOTE — Assessment & Plan Note (Signed)
 Continue Xarelto  Obtain D-dimer and other labs

## 2023-10-18 NOTE — Telephone Encounter (Signed)
 Please see the MyChart message reply(ies) for my assessment and plan.   Thank you for being patient.  In review of my previous note, she was having shortness of breath or dyspnea on exertion that was evaluated with a coronary CT scan that did show some moderate level of coronary stenosis but flow analysis did not show severe obstruction.  It sounds like things have progressed since her hip surgery.  According to my medication list, she is still taking the Xarelto  10 mg daily which is a prophylactic dose, not therapeutic dose for prevention of recurrent DVT PE.  I reviewed Dr. Alexandra note from today and agree with his extensive workup.  If D-dimer is normal, suspicion for pulmonary embolism would be extremely low.  I agree with him ordering the Zio patch monitor to look for any signs of arrhythmia.  He performed orthostatic blood pressures which were unremarkable.  He also look for any evidence of nystagmus or vertiginous-like symptoms on exam.  Excellent.  Hemoglobin is normal, no anemia.  Chemistry panel also unremarkable.  Thyroid  function appears normal.  All reassuring.  Prior echocardiogram showed normal overall pump function.  Obviously if symptoms worsen or become more worrisome, please report to the emergency department for urgent evaluation.  This patient gave consent for this Medical Advice Message and is aware that it may result in a bill to [employer], as well as the possibility of receiving a bill for a co-payment or deductible. They are an established patient, but are not seeking medical advice exclusively about a problem treated during an in person or video visit in the last seven days. I did not recommend an in person or video visit within seven days of my reply.    I spent a total of 15 minutes cumulative time within 7 days through [employer].  Oneil Parchment, MD

## 2023-10-18 NOTE — Progress Notes (Unsigned)
 EP to read.

## 2023-10-18 NOTE — Assessment & Plan Note (Signed)
 On Vit D

## 2023-10-18 NOTE — Progress Notes (Signed)
 Subjective:  Patient ID: Leah Bird, female    DOB: Aug 01, 1950  Age: 73 y.o. MRN: [id number]  CC: Annual Exam (Pt has had 2 dizzy spells x1 week ago )   HPI Leah Bird presents for SOB, dizziness, DOE - incline, near-syncope x 2, off balance x 1 month. Leah Bird recuperated from right hip surgery that she had about 3 months ago and was doing well.  However,about a month ago she started to have episodes of dyspnea on exertion.  She can walk a mile on a flat surface without a problem, but would get really short of breath walking the incline of her driveway.  She almost passed out twice recently.  She has been getting off balance for 1 month.  She would get lightheaded and dizzy with standing up.  There is no fever, cough, chest pain.  She is having some palpitations.  No double vision, headache, weight loss, extremity weakness, leg swelling.  The symptoms are not similar to her PE symptoms from the past.  She is not taking any new medicines. C/o UTI sx's -currently on Macrobid per her gynecologist.  No leg pain or cramps  Outpatient Medications Prior to Visit  Medication Sig Dispense Refill   b complex vitamins tablet Take 1 tablet by mouth daily. 100 tablet 3   Calcium  Citrate-Vitamin D  (CALCIUM  CITRATE +D PO) Take 1 tablet by mouth daily. 600 mg of calcium  citrate and 400 mg of D3     Cholecalciferol (VITAMIN D3) 50 MCG (2000 UT) capsule Take 2 capsules (4,000 Units total) by mouth daily. 100 capsule 3   folic acid  (FOLVITE ) 1 MG tablet Take 2 tablets (2 mg total) by mouth daily. 180 tablet 6   HYDROcodone -acetaminophen  (NORCO/VICODIN) 5-325 MG tablet Take 1 tablet by mouth every 6 (six) hours as needed for moderate pain (pain score 4-6). 20 tablet 0   rosuvastatin  (CRESTOR ) 10 MG tablet TAKE 1 TABLET BY MOUTH DAILY 90 tablet 3   XARELTO  10 MG TABS tablet TAKE 1 TABLET BY MOUTH DAILY 90 tablet 3   No facility-administered medications prior to visit.    ROS: Review of Systems   Constitutional:  Positive for activity change and fatigue. Negative for appetite change, chills, diaphoresis, fever and unexpected weight change.  HENT:  Negative for congestion, dental problem, ear pain, hearing loss, mouth sores, postnasal drip, sinus pressure, sinus pain, sneezing, sore throat, tinnitus, trouble swallowing and voice change.   Eyes:  Negative for photophobia, pain and visual disturbance.  Respiratory:  Positive for shortness of breath. Negative for cough, chest tightness, wheezing and stridor.   Cardiovascular:  Negative for chest pain, palpitations and leg swelling.  Gastrointestinal:  Negative for abdominal distention, abdominal pain, blood in stool, diarrhea, nausea, rectal pain and vomiting.  Endocrine: Negative for cold intolerance and polydipsia.  Genitourinary:  Negative for difficulty urinating, dysuria, frequency, hematuria and pelvic pain.  Musculoskeletal:  Positive for arthralgias. Negative for back pain, gait problem, joint swelling and neck pain.  Skin:  Negative for color change, rash and wound.  Neurological:  Positive for dizziness, syncope, weakness and light-headedness. Negative for tremors, seizures, facial asymmetry, speech difficulty, numbness and headaches.  Hematological:  Negative for adenopathy. Bruises/bleeds easily.  Psychiatric/Behavioral:  Negative for behavioral problems, confusion, decreased concentration, dysphoric mood, hallucinations, sleep disturbance and suicidal ideas. The patient is not nervous/anxious and is not hyperactive.     Objective:  BP 110/70   Pulse 74   Temp 98 F (36.7 C) (Oral)  Ht 5' 3 (1.6 m)   Wt 129 lb (58.5 kg)   SpO2 99%   BMI 22.85 kg/m   BP Readings from Last 3 Encounters:  10/18/23 110/70  09/12/23 113/67  04/18/23 133/65    Wt Readings from Last 3 Encounters:  10/18/23 129 lb (58.5 kg)  09/12/23 130 lb 12.8 oz (59.3 kg)  04/18/23 125 lb (56.7 kg)    Physical Exam Constitutional:      General:  She is not in acute distress.    Appearance: Normal appearance. She is well-developed.  HENT:     Head: Normocephalic.     Right Ear: External ear normal.     Left Ear: External ear normal.     Nose: Nose normal.  Eyes:     General:        Right eye: No discharge.        Left eye: No discharge.     Conjunctiva/sclera: Conjunctivae normal.     Pupils: Pupils are equal, round, and reactive to light.  Neck:     Thyroid : No thyromegaly.     Vascular: No JVD.     Trachea: No tracheal deviation.  Cardiovascular:     Rate and Rhythm: Normal rate and regular rhythm.     Heart sounds: Normal heart sounds.  Pulmonary:     Effort: No respiratory distress.     Breath sounds: No stridor. Rales present. No wheezing or rhonchi.  Abdominal:     General: Bowel sounds are normal. There is no distension.     Palpations: Abdomen is soft. There is no mass.     Tenderness: There is no abdominal tenderness. There is no guarding or rebound.  Musculoskeletal:        General: No tenderness.     Cervical back: Normal range of motion and neck supple. No rigidity.     Right lower leg: No edema.     Left lower leg: No edema.  Lymphadenopathy:     Cervical: No cervical adenopathy.  Skin:    Findings: No erythema or rash.  Neurological:     Mental Status: She is oriented to person, place, and time.     Cranial Nerves: No cranial nerve deficit.     Motor: No weakness or abnormal muscle tone.     Coordination: Coordination abnormal.     Gait: Gait abnormal.     Deep Tendon Reflexes: Reflexes normal.  Psychiatric:        Behavior: Behavior normal.        Thought Content: Thought content normal.        Judgment: Judgment normal.    Regular rhythm and rate, occasional extra beats- rare There are crackles crackles at lung bases on both sides No JVD  Procedure: EKG Indication: DOE Impression: NSR. No acute changes from 1 year ago. Hallpike maneuver is negative bilaterally There is no orthostatic  blood pressure drop when standing from a sitting position, however, Leah Bird would get very lightheaded, unsteady and uncomfortable for a few seconds following the position change    Lab Results  Component Value Date   WBC 9.9 10/18/2023   HGB 14.0 10/18/2023   HCT 42.2 10/18/2023   PLT 312.0 10/18/2023   GLUCOSE 98 10/18/2023   CHOL 150 11/04/2022   TRIG 63 11/04/2022   HDL 69 11/04/2022   LDLCALC 68 11/04/2022   ALT 19 10/18/2023   AST 20 10/18/2023   NA 139 10/18/2023   K 3.9 10/18/2023   CL  100 10/18/2023   CREATININE 0.62 10/18/2023   BUN 17 10/18/2023   CO2 33 (H) 10/18/2023   TSH 3.27 10/18/2023   INR 0.91 04/23/2011   HGBA1C 5.8 10/29/2019    US  Venous Img Lower Unilateral Left (DVT) Result Date: 09/12/2023 CLINICAL DATA:  LEFT lower extremity pain and swelling EXAM: LEFT LOWER EXTREMITY VENOUS DOPPLER ULTRASOUND TECHNIQUE: Gray-scale sonography with graded compression, as well as color Doppler and duplex ultrasound were performed to evaluate the lower extremity deep venous systems from the level of the common femoral vein and including the common femoral, femoral, profunda femoral, popliteal and calf veins including the posterior tibial, peroneal and gastrocnemius veins when visible. The superficial great saphenous vein was also interrogated. Spectral Doppler was utilized to evaluate flow at rest and with distal augmentation maneuvers in the common femoral, femoral and popliteal veins. COMPARISON:  07/29/2021 FINDINGS: Contralateral Common Femoral Vein: Respiratory phasicity is normal and symmetric with the symptomatic side. No evidence of thrombus. Normal compressibility. Common Femoral Vein: No evidence of thrombus. Normal compressibility, respiratory phasicity and response to augmentation. Saphenofemoral Junction: No evidence of thrombus. Normal compressibility and flow on color Doppler imaging. Profunda Femoral Vein: No evidence of thrombus. Normal compressibility and flow on  color Doppler imaging. Femoral Vein: No evidence of thrombus. Normal compressibility, respiratory phasicity and response to augmentation. Popliteal Vein: Eccentric, echogenic nonocclusive thrombus again seen in the LEFT popliteal vein, consistent with chronic DVT. Calf Veins: No evidence of thrombus. Normal compressibility and flow on color Doppler imaging. Superficial Great Saphenous Vein: No evidence of thrombus. Normal compressibility. Other Findings:  None. IMPRESSION: Unchanged chronic nonocclusive thrombus of the LEFT popliteal vein. Electronically Signed   By: Aliene Lloyd M.D.   On: 09/12/2023 12:44    Assessment & Plan:   Problem List Items Addressed This Visit     Vitamin D  deficiency   On Vit D      Low serum vitamin B12   Check B12      DVT (deep venous thrombosis) (HCC) (Chronic)   Continue Xarelto  Obtain D-dimer and other labs      Shortness of breath - Primary   New, exertional shortness of breath x 1 month of unclear etiology. Leah Bird recuperated from right hip surgery that she had about 3 months ago and was doing well.  However,about a month ago she started to have episodes of dyspnea on exertion.  She can walk a mile on a flat surface without a problem, but would get really short of breath walking the incline of her driveway.  She almost passed out twice recently.  She has been getting off balance for 1 month.  She would get lightheaded and dizzy with standing up.  There is no fever, cough, chest pain.  She is having some palpitations.  No double vision, headache, weight loss, extremity weakness, leg swelling.  The symptoms are not similar to her PE symptoms from the past.  She is not taking any new medicines. C/o UTI sx's -currently on Macrobid per her gynecologist Her EKG today is unchanged from previous Her echocardiogram a year ago was normal.  Her coronary morphology CT showed moderate coronary artery disease.  No angina chest pain.  No recent respiratory illness.  Last  COVID about a year ago. We will obtain chest x-ray, lab work including CBC, D-dimer, BNP etc. Will start Zio patch monitor for 2 weeks Follow-up with cardiology Call 911 and go to ER if worse       Relevant Orders  CBC with Differential/Platelet (Completed)   Comprehensive metabolic panel with GFR (Completed)   TSH (Completed)   D-dimer, quantitative   Troponin I   DG Chest 2 View   Pulmonary embolism (HCC)   New -unclear etiology New exertional shortness of breath x 1 month of unclear etiology. Leah Bird recuperated from right hip surgery that she had about 3 months ago and was doing well.  However,about a month ago she started to have episodes of dyspnea on exertion.  She can walk a mile on a flat surface without a problem, but would get really short of breath walking the incline of her driveway.  She almost passed out twice recently.  She has been getting off balance for 1 month.  She would get lightheaded and dizzy with standing up.  There is no fever, cough, chest pain.  She is having some palpitations.  No double vision, headache, weight loss, extremity weakness, leg swelling.  The symptoms are not similar to her PE symptoms from the past.  She is not taking any new medicines. C/o UTI sx's -currently on Macrobid per her gynecologist Her EKG today is unchanged from previous. Hallpike maneuver is negative bilaterally There is no orthostatic blood pressure drop when standing from a sitting position, however, Leah Bird would get very lightheaded, unsteady and uncomfortable for a few seconds following the position change Her echocardiogram a year ago was normal.  Her coronary morphology CT showed moderate coronary artery disease.  No angina chest pain.  No recent respiratory illness.  Last COVID about a year ago. We will obtain chest x-ray, lab work including CBC, D-dimer, BNP etc. Will start Zio patch monitor for 2 weeks Follow-up with cardiology      Near syncope   New -unclear etiology New  exertional shortness of breath x 1 month of unclear etiology. Leah Bird recuperated from right hip surgery that she had about 3 months ago and was doing well.  However,about a month ago she started to have episodes of dyspnea on exertion.  She can walk a mile on a flat surface without a problem, but would get really short of breath walking the incline of her driveway.  She almost passed out twice recently.  She has been getting off balance for 1 month.  She would get lightheaded and dizzy with standing up.  There is no fever, cough, chest pain.  She is having some palpitations.  No double vision, headache, weight loss, extremity weakness, leg swelling.  The symptoms are not similar to her PE symptoms from the past.  She is not taking any new medicines. C/o UTI sx's -currently on Macrobid per her gynecologist Her EKG today is unchanged from previous. Hallpike maneuver is negative bilaterally There is no orthostatic blood pressure drop when standing from a sitting position, however, Leah Bird would get very lightheaded, unsteady and uncomfortable for a few seconds following the position change Her echocardiogram a year ago was normal.  Her coronary morphology CT showed moderate coronary artery disease.  No angina chest pain.  No recent respiratory illness.  Last COVID about a year ago. We will obtain chest x-ray, lab work including CBC, D-dimer, BNP etc. Will start Zio patch monitor for 2 weeks Obtain brain CT without contrast Obtain chest CT angio if D-dimer is elevated (currently anticoagulated) Follow-up with cardiology      Relevant Orders   EKG 12-Lead   CBC with Differential/Platelet (Completed)   Comprehensive metabolic panel with GFR (Completed)   TSH (Completed)   D-dimer, quantitative  Troponin I   DG Chest 2 View   CT HEAD WO CONTRAST ( )   LONG TERM MONITOR (3-14 DAYS)   Cortisol (Completed)   Other Visit Diagnoses       Vertigo of central origin       Relevant Orders   CT HEAD WO  CONTRAST ( )   Cortisol (Completed)         No orders of the defined types were placed in this encounter.     Follow-up: Return in about 4 weeks (around 11/15/2023) for a follow-up visit.  Marolyn Noel, MD

## 2023-10-18 NOTE — Assessment & Plan Note (Signed)
 New -unclear etiology New exertional shortness of breath x 1 month of unclear etiology. Leah Bird recuperated from right hip surgery that she had about 3 months ago and was doing well.  However,about a month ago she started to have episodes of dyspnea on exertion.  She can walk a mile on a flat surface without a problem, but would get really short of breath walking the incline of her driveway.  She almost passed out twice recently.  She has been getting off balance for 1 month.  She would get lightheaded and dizzy with standing up.  There is no fever, cough, chest pain.  She is having some palpitations.  No double vision, headache, weight loss, extremity weakness, leg swelling.  The symptoms are not similar to her PE symptoms from the past.  She is not taking any new medicines. C/o UTI sx's -currently on Macrobid per her gynecologist Her EKG today is unchanged from previous. Hallpike maneuver is negative bilaterally There is no orthostatic blood pressure drop when standing from a sitting position, however, Leah Bird would get very lightheaded, unsteady and uncomfortable for a few seconds following the position change Her echocardiogram a year ago was normal.  Her coronary morphology CT showed moderate coronary artery disease.  No angina chest pain.  No recent respiratory illness.  Last COVID about a year ago. We will obtain chest x-ray, lab work including CBC, D-dimer, BNP etc. Will start Zio patch monitor for 2 weeks Follow-up with cardiology

## 2023-10-18 NOTE — Assessment & Plan Note (Addendum)
 New -unclear etiology New exertional shortness of breath x 1 month of unclear etiology. Leah Bird recuperated from right hip surgery that she had about 3 months ago and was doing well.  However,about a month ago she started to have episodes of dyspnea on exertion.  She can walk a mile on a flat surface without a problem, but would get really short of breath walking the incline of her driveway.  She almost passed out twice recently.  She has been getting off balance for 1 month.  She would get lightheaded and dizzy with standing up.  There is no fever, cough, chest pain.  She is having some palpitations.  No double vision, headache, weight loss, extremity weakness, leg swelling.  The symptoms are not similar to her PE symptoms from the past.  She is not taking any new medicines. C/o UTI sx's -currently on Macrobid per her gynecologist Her EKG today is unchanged from previous. Hallpike maneuver is negative bilaterally There is no orthostatic blood pressure drop when standing from a sitting position, however, Leah Bird would get very lightheaded, unsteady and uncomfortable for a few seconds following the position change Her echocardiogram a year ago was normal.  Her coronary morphology CT showed moderate coronary artery disease.  No angina chest pain.  No recent respiratory illness.  Last COVID about a year ago. We will obtain chest x-ray, lab work including CBC, D-dimer, BNP etc. Will start Zio patch monitor for 2 weeks Obtain brain CT without contrast Obtain chest CT angio if D-dimer is elevated (currently anticoagulated) Follow-up with cardiology

## 2023-10-18 NOTE — Assessment & Plan Note (Signed)
 Check B12

## 2023-10-18 NOTE — Assessment & Plan Note (Addendum)
 New, exertional shortness of breath x 1 month of unclear etiology. Leah Bird recuperated from right hip surgery that she had about 3 months ago and was doing well.  However,about a month ago she started to have episodes of dyspnea on exertion.  She can walk a mile on a flat surface without a problem, but would get really short of breath walking the incline of her driveway.  She almost passed out twice recently.  She has been getting off balance for 1 month.  She would get lightheaded and dizzy with standing up.  There is no fever, cough, chest pain.  She is having some palpitations.  No double vision, headache, weight loss, extremity weakness, leg swelling.  The symptoms are not similar to her PE symptoms from the past.  She is not taking any new medicines. C/o UTI sx's -currently on Macrobid per her gynecologist Her EKG today is unchanged from previous Her echocardiogram a year ago was normal.  Her coronary morphology CT showed moderate coronary artery disease.  No angina chest pain.  No recent respiratory illness.  Last COVID about a year ago. We will obtain chest x-ray, lab work including CBC, D-dimer, BNP etc. Will start Zio patch monitor for 2 weeks Follow-up with cardiology Call 911 and go to ER if worse

## 2023-10-19 ENCOUNTER — Ambulatory Visit
Admission: RE | Admit: 2023-10-19 | Discharge: 2023-10-19 | Disposition: A | Discharge status: Home / Self Care | Source: Ambulatory Visit | Attending: Internal Medicine | Admitting: Internal Medicine

## 2023-10-19 ENCOUNTER — Encounter: Payer: Self-pay | Admitting: Radiology

## 2023-10-19 ENCOUNTER — Other Ambulatory Visit: Payer: Self-pay | Admitting: Internal Medicine

## 2023-10-19 ENCOUNTER — Ambulatory Visit: Payer: Self-pay | Admitting: Internal Medicine

## 2023-10-19 DIAGNOSIS — R0602 Shortness of breath: Secondary | ICD-10-CM

## 2023-10-19 DIAGNOSIS — R918 Other nonspecific abnormal finding of lung field: Secondary | ICD-10-CM | POA: Diagnosis not present

## 2023-10-19 DIAGNOSIS — R55 Syncope and collapse: Secondary | ICD-10-CM

## 2023-10-19 DIAGNOSIS — H814 Vertigo of central origin: Secondary | ICD-10-CM

## 2023-10-19 DIAGNOSIS — Z86711 Personal history of pulmonary embolism: Secondary | ICD-10-CM

## 2023-10-19 DIAGNOSIS — G319 Degenerative disease of nervous system, unspecified: Secondary | ICD-10-CM | POA: Diagnosis not present

## 2023-10-19 LAB — D-DIMER, QUANTITATIVE: D-Dimer, Quant: 1.04 ug{FEU}/mL — ABNORMAL HIGH (ref <0.50)

## 2023-10-19 LAB — TROPONIN I: Troponin I: 9 ng/L (ref <=47)

## 2023-10-19 MED ORDER — IOPAMIDOL (ISOVUE-370) INJECTION 76%
75.0000 mL | Freq: Once | INTRAVENOUS | Status: AC | PRN
  Administered 2023-10-19: 75 mL via INTRAVENOUS

## 2023-10-19 NOTE — Telephone Encounter (Signed)
 Please see documentation in MyChart encounter for further information.

## 2023-10-19 NOTE — Progress Notes (Signed)
 CT angio today   Elev D dimer Pt is aware

## 2023-10-20 ENCOUNTER — Encounter: Payer: Self-pay | Admitting: Internal Medicine

## 2023-10-20 ENCOUNTER — Other Ambulatory Visit: Payer: Self-pay | Admitting: Internal Medicine

## 2023-10-20 DIAGNOSIS — R55 Syncope and collapse: Secondary | ICD-10-CM

## 2023-10-20 DIAGNOSIS — R0602 Shortness of breath: Secondary | ICD-10-CM

## 2023-10-20 NOTE — Telephone Encounter (Signed)
 We discussed yesterday probable POTS diagnosis causing orthostatic symptoms vs other. Advised to hydrate well, increase salt intake or take salt tablets, wear tight clothes, compression socks. Will move up cardiology appointment. Will ask Dr. Timmy about elevated D-dimer meaning.

## 2023-10-25 DIAGNOSIS — M25551 Pain in right hip: Secondary | ICD-10-CM | POA: Diagnosis not present

## 2023-11-02 DIAGNOSIS — Z961 Presence of intraocular lens: Secondary | ICD-10-CM | POA: Diagnosis not present

## 2023-11-07 ENCOUNTER — Telehealth (HOSPITAL_COMMUNITY): Payer: Self-pay | Admitting: *Deleted

## 2023-11-07 ENCOUNTER — Ambulatory Visit (HOSPITAL_BASED_OUTPATIENT_CLINIC_OR_DEPARTMENT_OTHER): Admitting: Cardiology

## 2023-11-07 VITALS — HR 50 | Ht 63.0 in | Wt 129.0 lb

## 2023-11-07 DIAGNOSIS — R55 Syncope and collapse: Secondary | ICD-10-CM

## 2023-11-07 DIAGNOSIS — R0602 Shortness of breath: Secondary | ICD-10-CM

## 2023-11-07 DIAGNOSIS — I2583 Coronary atherosclerosis due to lipid rich plaque: Secondary | ICD-10-CM

## 2023-11-07 NOTE — Telephone Encounter (Signed)
 Patient given instructions for upcoming stress test.  Argentina Bees, RN

## 2023-11-07 NOTE — Progress Notes (Signed)
 Cardiology Office Note:  .   Date:  11/07/2023  ID:  Leah Bird, DOB [birth year], MRN [id number] PCP: Garald Karlynn GAILS, MD  [facility] Providers Cardiologist:  Oneil Parchment, MD    History of Present Illness: .   Leah Bird is a 73 y.o. female Discussed the use of AI scribe software for clinical note transcription with the patient, who gave verbal consent to proceed.  History of Present Illness Leah Bird is a 73 year old female with coronary artery disease who presents with worsening shortness of breath and dizziness post-hip surgery. She was referred by Dr. Garald for evaluation of her cardiac symptoms.  Dyspnea and dizziness - Worsening shortness of breath and dizziness since hip surgery in 2025, she has been feeling the symptoms even prior to this. - Dyspnea on exertion, particularly after exercise - Episodes of feeling faint and lightheaded, especially when bending down and standing up - Describes episodes as 'scary as hell' and 'like wires burning' in her head - Symptoms are persistent and impact ability to walk and perform daily activities  Coronary artery disease and cardiac evaluation - History of coronary artery disease with prior coronary calcium  score of 96 - Moderate coronary stenosis on coronary CT scan but FFR negative. - Echocardiogram previously showed normal ejection fraction/function - CT angiogram of the chest showed no evidence of pulmonary embolism  Anticoagulation therapy - On Xarelto  10 mg daily for prophylaxis against recurrent DVT and PE  Cardiac rhythm monitoring - Wore Zio patch monitor for two weeks; results pending - Not on any AV nodal blocking agents  Prior diagnostic workup - D-dimer, orthostatic blood pressures, hemoglobin, chemistry panel, thyroid  function tests, and cortisol level all unremarkable - Brain CT negative for bleeding, mass, or stroke   Obviously she is very frustrated with her symptoms which have been  ongoing and somewhat progressive.    Studies Reviewed: SABRA   EKG Interpretation Date/Time:  Monday November 07 2023 10:16:25 EDT Ventricular Rate:  52 PR Interval:  294 QRS Duration:  128 QT Interval:  436 QTC Calculation: 405 R Axis:   -33  Text Interpretation:  Critical Test Result: AV Block Sinus rhythm with 2nd degree A-V block (Mobitz II) with 2:1 A-V conduction as well as second degree HB Type 1 Left axis deviation Right bundle branch block Minimal voltage criteria for LVH, may be normal variant ( R in aVL ) Confirmed by Parchment Oneil ([id number]) on 11/07/2023 10:27:45 AM    Results RADIOLOGY Coronary CT: Moderate coronary stenosis, no severe obstruction Brain CT: Negative for bleeding, mass, stroke Chest CT angiogram: No evidence of pulmonary embolism  DIAGNOSTIC EKG: Sinus rhythm, first-degree AV block, PR interval 294 ms, second-degree heart block type 1, 2:1 AV block, average heart rate 52 bpm, right bundle branch block, left axis deviation (11/07/2023) Risk Assessment/Calculations:           Physical Exam:   VS:  Pulse (!) 50   Ht 5' 3 (1.6 m)   Wt 129 lb (58.5 kg)   SpO2 100%   BMI 22.85 kg/m    Wt Readings from Last 3 Encounters:  11/07/23 129 lb (58.5 kg)  10/18/23 129 lb (58.5 kg)  09/12/23 130 lb 12.8 oz (59.3 kg)    GEN: Well nourished, well developed in no acute distress NECK: No JVD; No carotid bruits CARDIAC: Nola reg with occasional ectopy, no murmurs, no rubs, no gallops RESPIRATORY:  Clear to auscultation without rales, wheezing or rhonchi  ABDOMEN: Soft, non-tender, non-distended EXTREMITIES:  No edema; No deformity   ASSESSMENT AND PLAN: .    Assessment and Plan Assessment & Plan Atrioventricular block with bradycardia and conduction abnormalities EKG shows sinus rhythm with first-degree AV block, PR interval of 294 ms, second-degree heart block type 1 (Wenckebach phenomenon), and 2:1 AV block. Average heart rate is 52 bpm with right bundle branch  block and left axis deviation. Symptoms include exertional dyspnea and presyncope. No AV nodal blocking agents are being used. Concerns that even with pacemaker, symptoms may not resolve completely. - Refer to electrophysiology for pacemaker evaluation - Perform treadmill test to assess heart rate response to exercise - Await results of Zio   Exertional dyspnea and presyncope Symptoms of shortness of breath and presyncope, especially after exertion.  Recent cardiac workup for the symptoms was overall unrevealing.  Mild to moderate coronary plaque noted on prior CT. - Perform treadmill test to assess heart rate response to exercise  Coronary atherosclerosis with moderate stenosis Coronary CT scan showed moderate coronary stenosis without severe obstruction. Previous coronary calcium  score was 96.  We could perform coronary angiography if absolutely necessary. - Perform treadmill test to assess heart rate response to exercise  History of pulmonary embolism and deep vein thrombosis DVT and small left-sided subsegmental PE. Currently on prophylactic dose of Rivaroxaban  (Xarelto ) 10 mg daily. Recent CT scan showed no evidence of new pulmonary embolism. Workup by primary care physician were unremarkable.  Possible dysautonomia (POTS syndrome) Symptoms include presyncope, lightheadedness, and feeling faint, especially when bending down and standing up. Previous workup by primary care physician included orthostatic blood pressures, which were unremarkable. Dysautonomia, including POTS, considered as a differential diagnosis, but symptoms do not fully align. Liberalization of salt and fluids has been attempted without significant improvement.         Dispo: Follow up with testing.   Signed, Oneil Parchment, MD

## 2023-11-07 NOTE — Patient Instructions (Signed)
 Medication Instructions:  The current medical regimen is effective;  continue present plan and medications.  *If you need a refill on your cardiac medications before your next appointment, please call your pharmacy*  Testing/Procedures: Your physician has requested that you have an exercise tolerance test.  On the day of your test please have nothing to eat or drink 3 hours before.  Come prepared to exercise in 2 piece comfortable clothing and walking shoes.  You have been referred to Electrophysiology for further evaluation of your AV block.  Follow-Up: At [facility], you and your health needs are our priority.  As part of our continuing mission to provide you with exceptional heart care, our providers are all part of one team.  This team includes your primary Cardiologist (physician) and Advanced Practice Providers or APPs (Physician Assistants and Nurse Practitioners) who all work together to provide you with the care you need, when you need it.  Your next appointment:   6 month(s)  Provider:   Oneil Parchment, MD    We recommend signing up for the patient portal called MyChart.  Sign up information is provided on this After Visit Summary.  MyChart is used to connect with patients for Virtual Visits (Telemedicine).  Patients are able to view lab/test results, encounter notes, upcoming appointments, etc.  Non-urgent messages can be sent to your provider as well.   To learn more about what you can do with MyChart, go to [email or web address].

## 2023-11-09 ENCOUNTER — Emergency Department (HOSPITAL_COMMUNITY)

## 2023-11-09 ENCOUNTER — Other Ambulatory Visit: Payer: Self-pay

## 2023-11-09 ENCOUNTER — Emergency Department (HOSPITAL_COMMUNITY)
Admission: EM | Admit: 2023-11-09 | Discharge: 2023-11-09 | Disposition: A | Discharge status: Home / Self Care | Attending: Emergency Medicine | Admitting: Emergency Medicine

## 2023-11-09 ENCOUNTER — Ambulatory Visit (HOSPITAL_BASED_OUTPATIENT_CLINIC_OR_DEPARTMENT_OTHER)
Admission: RE | Admit: 2023-11-09 | Discharge: 2023-11-09 | Disposition: A | Discharge status: Home / Self Care | Source: Ambulatory Visit | Attending: Cardiovascular Disease | Admitting: Cardiovascular Disease

## 2023-11-09 DIAGNOSIS — R7989 Other specified abnormal findings of blood chemistry: Secondary | ICD-10-CM | POA: Insufficient documentation

## 2023-11-09 DIAGNOSIS — I2510 Atherosclerotic heart disease of native coronary artery without angina pectoris: Secondary | ICD-10-CM | POA: Diagnosis not present

## 2023-11-09 DIAGNOSIS — Z7901 Long term (current) use of anticoagulants: Secondary | ICD-10-CM | POA: Diagnosis not present

## 2023-11-09 DIAGNOSIS — Z86718 Personal history of other venous thrombosis and embolism: Secondary | ICD-10-CM

## 2023-11-09 DIAGNOSIS — R110 Nausea: Secondary | ICD-10-CM | POA: Diagnosis not present

## 2023-11-09 DIAGNOSIS — R001 Bradycardia, unspecified: Secondary | ICD-10-CM | POA: Diagnosis not present

## 2023-11-09 DIAGNOSIS — I499 Cardiac arrhythmia, unspecified: Secondary | ICD-10-CM | POA: Diagnosis not present

## 2023-11-09 DIAGNOSIS — R000 Tachycardia, unspecified: Secondary | ICD-10-CM

## 2023-11-09 DIAGNOSIS — I441 Atrioventricular block, second degree: Secondary | ICD-10-CM | POA: Diagnosis not present

## 2023-11-09 DIAGNOSIS — R55 Syncope and collapse: Secondary | ICD-10-CM | POA: Diagnosis not present

## 2023-11-09 LAB — BASIC METABOLIC PANEL WITH GFR
Anion gap: 9 (ref 5–15)
BUN: 15 mg/dL (ref 8–23)
CO2: 27 mmol/L (ref 22–32)
Calcium: 9.5 mg/dL (ref 8.9–10.3)
Chloride: 103 mmol/L (ref 98–111)
Creatinine, Ser: 0.75 mg/dL (ref 0.44–1.00)
GFR, Estimated: >60 mL/min (ref >60)
Glucose, Bld: 101 mg/dL — ABNORMAL HIGH (ref 70–99)
Potassium: 4.1 mmol/L (ref 3.5–5.1)
Sodium: 139 mmol/L (ref 135–145)

## 2023-11-09 LAB — CBC
HCT: 44.2 % (ref 36.0–46.0)
Hemoglobin: 14.3 g/dL (ref 12.0–15.0)
MCH: 30.7 pg (ref 26.0–34.0)
MCHC: 32.4 g/dL (ref 30.0–36.0)
MCV: 94.8 fL (ref 80.0–100.0)
Platelets: 333 K/uL (ref 150–400)
RBC: 4.66 MIL/uL (ref 3.87–5.11)
RDW: 13.2 % (ref 11.5–15.5)
WBC: 9.1 K/uL (ref 4.0–10.5)
nRBC: 0 % (ref 0.0–0.2)

## 2023-11-09 LAB — EXERCISE TOLERANCE TEST
Angina Index: 0
Duke Treadmill Score: 1
Estimated workload: 3.4
Exercise duration (min): 1 min
Exercise duration (sec): 22 s
MPHR: 146 {beats}/min
Peak HR: 63 {beats}/min
Percent HR: 43 %
Rest HR: 46 {beats}/min
ST Depression (mm): 0 mm

## 2023-11-09 LAB — TROPONIN I (HIGH SENSITIVITY)
Troponin I (High Sensitivity): 19 ng/L — ABNORMAL HIGH (ref <18)
Troponin I (High Sensitivity): 22 ng/L — ABNORMAL HIGH (ref <18)

## 2023-11-09 MED ORDER — ONDANSETRON 4 MG PO TBDP: 4.0000 mg | ORAL_TABLET | Freq: Three times a day (TID) | ORAL | 1 refills | Status: DC | PRN

## 2023-11-09 NOTE — ED Provider Notes (Signed)
  Physical Exam  BP 115/60   Pulse 65   Temp 98 F (36.7 C) (Oral)   Resp 18   Ht 5' 3 (1.6 m)   Wt 58.5 kg   SpO2 98%   BMI 22.85 kg/m   Physical Exam Vitals and nursing note reviewed.  Constitutional:      Appearance: Normal appearance.  HENT:     Head: Normocephalic and atraumatic.  Eyes:     General:        Right eye: No discharge.        Left eye: No discharge.     Conjunctiva/sclera: Conjunctivae normal.  Pulmonary:     Effort: Pulmonary effort is normal.  Skin:    General: Skin is warm and dry.     Findings: No rash.  Neurological:     General: No focal deficit present.     Mental Status: She is alert.  Psychiatric:        Mood and Affect: Mood normal.        Behavior: Behavior normal.     Procedures  Procedures  ED Course / MDM    Medical Decision Making Amount and/or Complexity of Data Reviewed Labs: ordered. Radiology: ordered.   Accepted handoff at shift change from Tran PA-C. Please see prior provider note for more detail.   Briefly: Patient is 73 y.o. female who presents to the emerged from today for further evaluation of symptomatic bradycardia.  DDX: concern for bradycardia  Plan: Follow-up with cardiology.  Plan to have pacemaker placed on August 15.  They will contact the patient.  Strict return precautions were discussed.  She is safe for discharge at this time.              Theotis Peers M, PA-C 11/09/23 1816    Elnor Jayson LABOR, DO 11/14/23 [id number]

## 2023-11-09 NOTE — Discharge Instructions (Addendum)
 Please follow-up with cardiology as discussed. You may return to the ED for any worsening symptoms.

## 2023-11-09 NOTE — Consult Note (Addendum)
 Cardiology Consultation  Patient ID: Leah Bird MRN: [id number]; DOB: 01/05/1951   Admission date: 11/09/2023  PCP:  Garald Leah GAILS, MD   [facility] Providers Cardiologist:  Oneil Parchment, MD       Chief Complaint: presyncope   Patient Profile: Leah Bird is a 73 y.o. 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  History of Present Illness: Ms. Leah Bird has past medical history as stated above. She was just seen in our office 11/07/2023  for worsening shortness of breath and dizziness post-hip surgery. She was found to be in first-degree AV block at that visit.  At this visit a treadmill stress test was ordered to assess heart rate response to exercise.  She was scheduled for and underwent a stress test today, 11/09/2023, it was stopped a few minutes into the day test, due to near syncope and second degree heart block.  When she was last seen by Dr. Parchment, 11/07/2023, he discussed that she previously wore a ZIO, still waiting the results on that.  And that she would probably have to be referred to electrophysiology for pacemaker evaluation.  She was not on any AV nodal blockers at this time.  She underwent a coronary CTA in May 2024 that showed no significant stenosis.  Her most recent calcium  score is 95.9, which was 67th percentile.  Her home medications include: Xarelto  10 mg daily for recurrent PE/DVT, Crestor  10 mg daily.  She presented with HR in the 40s, BP 180/60. Most recent vitals show HR 70s with BP 118/58.   Relevant workup in the ED includes: CBC normal, BMP normal, initial troponin 19, CXR showed no active disease.   After speaking with the patient and her husband, she tells me that she has been feeling symptomatic for about one year. Admits that it has been slowly getting progressively worse and then over the last few  weeks is when she has noticed a significant difference in her symptoms. She tells me that her ZIO monitor was mailed back, after wearing for 2 weeks (ordered by her PCP), she believes it was just received recently. She tells me that she is unable to do anything exertional without feeling poorly. She reports lightheadedness, nausea, extreme fatigue. She tells me that even simple tasks, such as washing the dishes, have her feeling symptomatic. She was scheduled to see our EP team on 8/19 to discuss possible PPM placement. She has never been on any AV nodal blocking agents.   Past Medical History:  Diagnosis Date   Arthritis    Chicken pox    COVID    Degenerative disk disease    whole spine-modic 1 spinal disease   DVT (deep venous thrombosis) (HCC)    Dyslipidemia    Hematoma    History of UTI    No pertinent past medical history    Vitamin D  deficiency    Past Surgical History:  Procedure Laterality Date   DILATION AND CURETTAGE OF UTERUS  1990   Reacted to anesthesia/was on life support   EYE SURGERY     TUBAL LIGATION     WISDOM TOOTH EXTRACTION     under sedation    Medications Prior to Admission: Prior to Admission medications   Medication Sig Start Date End Date Taking? Authorizing Provider  b complex vitamins tablet Take 1 tablet by mouth  daily. 04/23/19   Plotnikov, Leah GAILS, MD  Calcium  Citrate-Vitamin D  (CALCIUM  CITRATE +D PO) Take 1 tablet by mouth daily. 600 mg of calcium  citrate and 400 mg of D3    [provider]  Cholecalciferol (VITAMIN D3) 50 MCG (2000 UT) capsule Take 2 capsules (4,000 Units total) by mouth daily. Patient taking differently: Take 2,000 Units by mouth daily. 10/29/19   Plotnikov, Leah V, MD  folic acid  (FOLVITE ) 1 MG tablet Take 2 tablets (2 mg total) by mouth daily. Patient taking differently: Take 800 mg by mouth daily. 07/29/21   Leah Maude SAUNDERS, MD  HYDROcodone -acetaminophen  (NORCO/VICODIN) 5-325 MG tablet Take 1 tablet by mouth every  6 (six) hours as needed for moderate pain (pain score 4-6). 01/19/23   Plotnikov, Leah GAILS, MD  rosuvastatin  (CRESTOR ) 10 MG tablet TAKE 1 TABLET BY MOUTH DAILY 08/23/23   Jeffrie Oneil BROCKS, MD  XARELTO  10 MG TABS tablet TAKE 1 TABLET BY MOUTH DAILY 06/14/23   Leah Maude SAUNDERS, MD    Allergies:    Allergies  Allergen Reactions   Other Shortness Of Breath    Pseudocholinesterace- prolonged anesthesia. 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   Social History:   Social History   Socioeconomic History   Marital status: Married    Spouse name: Not on file   Number of children: Not on file   Years of education: Not on file   Highest education level: Associate degree: academic program  Occupational History   Not on file  Tobacco Use   Smoking status: Never   Smokeless tobacco: Never  Vaping Use   Vaping status: Never Used  Substance and Sexual Activity   Alcohol use: Yes    Alcohol/week: 4.0 standard drinks of alcohol    Types: 4 Glasses of wine per week   Drug use: No   Sexual activity: Not on file  Other Topics Concern   Not on file  Social History Narrative   Not on file   Social Drivers of Health   Financial Resource Strain: Low Risk  (10/17/2023)   Overall Financial Resource Strain (CARDIA)    Difficulty of Paying Living Expenses: Not hard at all  Food Insecurity: No Food Insecurity (10/17/2023)   Hunger Vital Sign    Worried About Running Out of Food in the Last Year: Never true    Ran Out of Food in the Last Year: Never true  Transportation Needs: No Transportation Needs (10/17/2023)   PRAPARE - Administrator, Civil Service (Medical): No    Lack of Transportation (Non-Medical): No  Physical Activity: Unknown (10/17/2023)   Exercise Vital Sign    Days of Exercise per Week: Patient declined    Minutes of Exercise per Session: Not on file  Stress:  No Stress Concern Present (10/17/2023)   [employer] of Occupational Health - Occupational Stress Questionnaire    Feeling of Stress: Only a little  Social Connections: Moderately Isolated (10/17/2023)   Social Connection and Isolation Panel    Frequency of Communication with Friends and Family: More than three times a week    Frequency of Social Gatherings with Friends and Family: More than three times a week    Attends Religious Services: Never    Database administrator or Organizations: No    Attends Engineer, structural: Not on file    Marital Status: Married  Intimate Partner Violence: Low Risk  (02/22/2022)   Received from [facility] [place] / [place]) and Manufacturing engineer   UC IPV    Have you ever been emotionally or physically abused by your partner or someone important to you?: Not on file    Within the last year have you been hit, slapped, kicked or otherwise physically hurt by someone?: Not on file    Since you've been pregnant, have you been slapped, kicked or otherwise physically hurt by someone?: Not on file    Within the last year has anyone forced you to have sexual activities?: Not on file    Are you afraid of your spouse or partner you listed above?: Not on file    Family History:   The patient's family history includes Arthritis in her maternal grandmother, mother, and sister; Asthma in her son; Depression in her sister; Diabetes in her sister; Hearing loss in her maternal grandfather, paternal grandfather, and son; Heart attack in her father and paternal grandfather; Heart disease in her father and mother; Kidney disease in her father; Learning disabilities in her son. There is no history of Colon cancer, Rectal cancer, or Stomach cancer.    ROS:  Please see the history of present illness.  All other ROS reviewed and negative.     Physical Exam/Data: Vitals:   11/09/23 1059 11/09/23 1100 11/09/23 1101 11/09/23 1230  BP: (!) 180/60 (!) 166/68  (!)  118/58  Pulse: (!) 40  (!) 38 71  Resp: 16 17 19 14   Temp: 97.8 F (36.6 C)     TempSrc: Oral     SpO2: 100%  99% 98%  Weight:      Height:       No intake or output data in the 24 hours ending 11/09/23 1419    11/09/2023   10:55 AM 11/07/2023   10:05 AM 10/18/2023    1:50 PM  Last 3 Weights  Weight (lbs) 129 lb 129 lb 129 lb  Weight (kg) 58.514 kg 58.514 kg 58.514 kg     Body mass index is 22.85 kg/m.   General:  Well nourished, well developed, in no acute distress HEENT: normal Neck: no JVD Vascular: No carotid bruits; Distal pulses 2+ bilaterally   Cardiac:  normal S1, S2; regular rate, occasional ectopy; no murmur  Lungs:  clear to auscultation bilaterally, no wheezing, rhonchi or rales  Abd: soft, nontender, no hepatomegaly  Ext: no edema Musculoskeletal:  No deformities, BUE and BLE strength normal and equal Skin: warm and dry  Neuro:  no focal abnormalities noted Psych:  Normal affect   EKG:  The ECG that was done 11/09/2023 was personally reviewed and demonstrates 2nd degree AV block, RBBB (no significant changes from prior EKG)  Relevant CV Studies:  Echocardiogram, 09/30/2022 Left ventricular ejection fraction, by estimation, is 60 to 65% . The left ventricle has normal function. The left ventricle has no regional wall motion abnormalities. There is mild left ventricular hypertrophy. Left ventricular diastolic parameters are consistent with Grade I diastolic dysfunction ( impaired relaxation) .  Right ventricular systolic function is normal. The right ventricular size is normal.  The mitral valve is normal in structure. No evidence of mitral valve regurgitation.  The aortic valve is normal in structure. Aortic valve regurgitation is not visualized.  The inferior vena cava is normal in size with greater than 50% respiratory variability, suggesting right atrial pressure of 3 mmHg.  Comparison(s) : No significant change from prior study.  Coronary CTA, 08/25/2022 Left  Main:  No significant stenosis. FFR = 0.99 LAD: No significant stenosis. Proximal FFR = 0.98, Mid FFR = 0.93, Distal FFR = not modeled LCX: No significant stenosis. Proximal FFR = 0.98, Distal FFR = 0.93 RCA: No significant stenosis. Proximal FFR = 0.99, Mid FFR = 0.91, Distal FFR = 0.91  IMPRESSION: 1.  CT FFR analysis did not show any significant stenosis (<0.75).  Calcium  score 06/23/2022 Total calcium  score of 95.9, which places the patient in the 67th percentile for subjects of the same age, gender, and race/ethnicity. Aortic Atherosclerosis (ICD10-I70.0). Scoliosis and chronic disc disease in the visualized spine  Laboratory Data: High Sensitivity Troponin:   Recent Labs  Lab 11/09/23 1106  TROPONINIHS 19*      Chemistry Recent Labs  Lab 11/09/23 1106  NA 139  K 4.1  CL 103  CO2 27  GLUCOSE 101*  BUN 15  CREATININE 0.75  CALCIUM  9.5  GFRNONAA >60  ANIONGAP 9    No results for input(s): PROT, ALBUMIN, AST, ALT, ALKPHOS, BILITOT in the last 168 hours. Lipids No results for input(s): CHOL, TRIG, HDL, LABVLDL, LDLCALC, CHOLHDL in the last 168 hours. Hematology Recent Labs  Lab 11/09/23 1106  WBC 9.1  RBC 4.66  HGB 14.3  HCT 44.2  MCV 94.8  MCH 30.7  MCHC 32.4  RDW 13.2  PLT 333   Thyroid  No results for input(s): TSH, FREET4 in the last 168 hours. BNPNo results for input(s): BNP, PROBNP in the last 168 hours.  DDimer No results for input(s): DDIMER in the last 168 hours.  Radiology/Studies:  DG Chest Port 1 View Result Date: 11/09/2023 CLINICAL DATA:  near syncope. EXAM: PORTABLE CHEST 1 VIEW COMPARISON:  10/19/2023. FINDINGS: Bilateral lungs appear hyperlucent with coarse bronchovascular markings, in keeping with COPD. Bilateral lungs otherwise appear clear. No dense consolidation or lung collapse. Bilateral costophrenic angles are clear. Normal cardio-mediastinal silhouette. No acute osseous abnormalities. The soft tissues are  within normal limits. IMPRESSION: No active disease. Electronically Signed   By: Ree Molt M.D.   On: 11/09/2023 11:29   Assessment and Plan:  2:1 AV Block, Mobitz 1 vs 2 Exertional dyspnea Presyncope  Presented from outpatient stress test, after becoming symptomatic, with heart block Symptoms have been progressive for ~1 year but much worse recently Reports lightheadedness, nausea, presyncope, dizziness  EKG and telemetry show intermittent second degree AV block type 2 Patient asymptomatic at rest, but symptomatic with even minor exertion Scheduled to see our EP team 8/19 to discuss possible PPM On Xarelto  for recurrent DVT/PE, last dose 8/5 PM Continue to monitor on telemetry  Will attempt to discuss with EP while still in the hospital to get input on PPM placement to expedite procedure    Recurrent DVT/PE Home meds: Xarelto  10 mg daily Last dose 8/5 evening  Continue to hold prior to possible PPM placement   Coronary atherosclerosis on coronary CT Calcium  score 96 (67th percentile) Patient denies any prior or current chest pain  Home meds: Crestor  10 mg daily    For questions or updates, please contact [facility] Please consult [email or web address] for contact info under     Signed, Waddell DELENA Donath, PA-C  11/09/2023 2:19 PM   I have seen and examined the patient along with Waddell DELENA Donath, PA-C.  I have reviewed the chart, notes and new data.  I agree with PA/NP's note.  Key new complaints: Months of varying symptoms of dizziness and nausea at rest,  near syncope without complete loss of consciousness, fatigue, shortness of breath with exertion, but no angina, of varying intensity. Key examination changes: Irregular rhythm with grouped beating consistent with Wenckebach cycles.  Otherwise normal cardiovascular examination. Key new findings / data: Coronary CT angiogram a year ago showed no significant coronary obstruction and average plaque burden, mild aortic  atherosclerosis, echocardiogram last June showed normal left ventricular systolic function and no evidence of any structural abnormalities, current ECGs show evidence of second-degree AV block Mobitz type I, often with 2: 1 AV conduction and significant bradycardia, right bundle branch block and left axis deviation not quite meeting criteria for left anterior fascicular block. During attempts at treadmill testing today, the degree of AV block became more severe with increasing heart rates. She has worn an arrhythmia monitor, this has been received but not yet scanned by the third-party vendor. Minimally elevated troponin I of 19 today is of very little clinical significance.  Normal TSH just 3 weeks ago.  PLAN: Symptomatic bradycardia due to second-degree AV block Mobitz type I. No identifiable reversible cause. Treatment will consist of a dual-chamber permanent pacemaker. She is currently on anticoagulation with Xarelto  and would be preferable for that medication to be withheld for 2 or 3 days before pacemaker implantation. Will discuss timing of the procedure with Dr. Inocencio, who is scheduled to see her for EP consultation on 11/22/2023 anyway. She has nausea due to the loss of normal AV synchrony and this has responded to Zofran .  Will give her a short supply of oral Zofran  to take at home until pacemaker can be implanted.  We discussed the purpose of pacemaker implantation, risks/benefits and possible complications (including and not limited to the risks of pneumothorax, perforation, infection, lead dislodgment need for reoperation, etc.), activity restrictions after the procedure, follow-up including remote pacemaker downloads, expected device longevity and MRI conditional status, etc.  Both the patient and her husband, who is a dermatologist, asked many pertinent questions that were answered.  They are agreeable to go ahead with pacemaker implantation as soon as this can be scheduled,  understanding that she will need to hold anticoagulation for 2 or 3 days before the procedure.  Jerel Balding, MD, FACC [facility] [phone] 11/09/2023, 3:02 PM

## 2023-11-09 NOTE — ED Provider Notes (Signed)
 [place] EMERGENCY DEPARTMENT AT [facility] Provider Note   CSN: [id number] Arrival date & time: 11/09/23  1051     Patient presents with: No chief complaint on file.   Leah Bird is a 73 y.o. female.   The history is provided by the patient, the EMS personnel, the spouse and medical records. No language interpreter was used.     73 year old female history of DVT on Xarelto , anemia, CAD brought here via EMS with concerns of low heart rate.  Patient mention for the past several weeks she just does not felt well.  States she feels fatigued, if she walks up an incline hill or get up too quickly she will feel lightheadedness and tiring.  She was seen by her cardiology doctor a few days ago for her symptoms and today she went for a stress test.  States within a few minutes of doing her stress test she nearly passed out and felt nauseous.  They stopped the test and patient was brought here for further evaluation.  She does not endorse any significant chest pain or shortness of breath she denies any fever chills or productive cough she is compliant with her Xarelto .  She has never had any cardiac stress test in the past and denies any significant cardiac history.  She denies tobacco use.  She has noticed that her heart rate has been running low for quite a while.  Prior to Admission medications   Medication Sig Start Date End Date Taking? Authorizing Provider  b complex vitamins tablet Take 1 tablet by mouth daily. 04/23/19   Plotnikov, Karlynn GAILS, MD  Calcium  Citrate-Vitamin D  (CALCIUM  CITRATE +D PO) Take 1 tablet by mouth daily. 600 mg of calcium  citrate and 400 mg of D3    [provider]  Cholecalciferol (VITAMIN D3) 50 MCG (2000 UT) capsule Take 2 capsules (4,000 Units total) by mouth daily. Patient taking differently: Take 2,000 Units by mouth daily. 10/29/19   Plotnikov, Aleksei V, MD  folic acid  (FOLVITE ) 1 MG tablet Take 2 tablets (2 mg total) by mouth daily. Patient  taking differently: Take 800 mg by mouth daily. 07/29/21   Timmy Maude SAUNDERS, MD  HYDROcodone -acetaminophen  (NORCO/VICODIN) 5-325 MG tablet Take 1 tablet by mouth every 6 (six) hours as needed for moderate pain (pain score 4-6). 01/19/23   Plotnikov, Karlynn GAILS, MD  rosuvastatin  (CRESTOR ) 10 MG tablet TAKE 1 TABLET BY MOUTH DAILY 08/23/23   Jeffrie Oneil BROCKS, MD  XARELTO  10 MG TABS tablet TAKE 1 TABLET BY MOUTH DAILY 06/14/23   Timmy Maude SAUNDERS, MD    Allergies: Other and Succinylcholine    Review of Systems  All other systems reviewed and are negative.   Updated Vital Signs BP (!) 118/58   Pulse 71   Temp 97.8 F (36.6 C) (Oral)   Resp 14   Ht 5' 3 (1.6 m)   Wt 58.5 kg   SpO2 98%   BMI 22.85 kg/m   Physical Exam Vitals and nursing note reviewed.  Constitutional:      General: She is not in acute distress.    Appearance: She is well-developed.  HENT:     Head: Atraumatic.  Eyes:     Conjunctiva/sclera: Conjunctivae normal.  Cardiovascular:     Rate and Rhythm: Bradycardia present.  Pulmonary:     Effort: Pulmonary effort is normal.     Breath sounds: No wheezing, rhonchi or rales.  Abdominal:     Palpations: Abdomen is soft.  Tenderness: There is no abdominal tenderness.  Musculoskeletal:        General: Normal range of motion.     Cervical back: Neck supple.     Right lower leg: No edema.     Left lower leg: No edema.  Skin:    Findings: No rash.  Neurological:     Mental Status: She is alert and oriented to person, place, and time.  Psychiatric:        Mood and Affect: Mood normal.     (all labs ordered are listed, but only abnormal results are displayed) Labs Reviewed  BASIC METABOLIC PANEL WITH GFR - Abnormal; Notable for the following components:      Result Value   Glucose, Bld 101 (*)    All other components within normal limits  TROPONIN I (HIGH SENSITIVITY) - Abnormal; Notable for the following components:   Troponin I (High Sensitivity) 19 (*)     All other components within normal limits  CBC  TROPONIN I (HIGH SENSITIVITY)    EKG: EKG Interpretation Date/Time:  Wednesday November 09 2023 11:00:09 EDT Ventricular Rate:  40 PR Interval:  278 QRS Duration:  140 QT Interval:  550 QTC Calculation: 449 R Axis:   -30  Text Interpretation: Sinus bradycardia Prolonged PR interval Right bundle branch block Probable left ventricular hypertrophy with 2nd degree A-V block (Mobitz II) No significant change since last tracing Confirmed by Doretha Folks ([id number]) on 11/09/2023 11:39:31 AM  Radiology: [facility] Chest Port 1 View Result Date: 11/09/2023 CLINICAL DATA:  near syncope. EXAM: PORTABLE CHEST 1 VIEW COMPARISON:  10/19/2023. FINDINGS: Bilateral lungs appear hyperlucent with coarse bronchovascular markings, in keeping with COPD. Bilateral lungs otherwise appear clear. No dense consolidation or lung collapse. Bilateral costophrenic angles are clear. Normal cardio-mediastinal silhouette. No acute osseous abnormalities. The soft tissues are within normal limits. IMPRESSION: No active disease. Electronically Signed   By: Ree Molt M.D.   On: 11/09/2023 11:29     Procedures   Medications Ordered in the ED - No data to display                                  Medical Decision Making Amount and/or Complexity of Data Reviewed Labs: ordered. Radiology: ordered.   BP (!) 166/68   Pulse (!) 38   Temp 97.8 F (36.6 C) (Oral)   Resp 19   Ht 5' 3 (1.6 m)   Wt 58.5 kg   SpO2 99%   BMI 22.85 kg/m   81:29 AM  73 year old female history of DVT on Xarelto , anemia, CAD brought here via EMS with concerns of low heart rate.  Patient mention for the past several weeks she just does not felt well.  States she feels fatigued, if she walks up an incline hill or get up too quickly she will feel lightheadedness and tiring.  She was seen by her cardiology doctor a few days ago for her symptoms and today she went for a stress test.  States within a few  minutes of doing her stress test she nearly passed out and felt nauseous.  They stopped the test and patient was brought here for further evaluation.  She does not endorse any significant chest pain or shortness of breath she denies any fever chills or productive cough she is compliant with her Xarelto .  She has never had any cardiac stress test in the past and denies any significant  cardiac history.  She denies tobacco use.  She has noticed that her heart rate has been running low for quite a while.  On exam patient is resting comfortably appears to be in no acute discomfort.  Exam notable for bradycardia with heart rate between the 30s to 40s.  No peripheral edema and patient is mentating appropriately.  At this time patient does not need any emergent medical intervention such as atropine  as her blood pressure remains elevated.  EMR reviewed patient was seen and evaluated by her cardiologist 2 days ago for similar presentation and did have an exercise tolerance test today.  Due to her bradycardia, she would likely need need hospital admission for further workup which may includes EP evaluation for possible pacemaker.  -Labs ordered, independently viewed and interpreted by me.  Labs remarkable for mildly elevated troponin of 19.  Patient without any active chest pain. -The patient was maintained on a cardiac monitor.  I personally viewed and interpreted the cardiac monitored which showed an underlying rhythm of: Sinus bradycardia with second-degree heart block and prolonged QT similar to prior -Imaging independently viewed and interpreted by me and I agree with radiologist's interpretation.  Result remarkable for chest x-ray without acute finding -This patient presents to the ED for concern of lightheadedness, this involves an extensive number of treatment options, and is a complaint that carries with it a high risk of complications and morbidity.  The differential diagnosis includes bradycardia, anemia,  electrolyte imbalance, cardiac arrhythmia, metabolic derangement, vasovagal, POTS -Co morbidities that complicate the patient evaluation includes anemia, CAD, DVT -Treatment includes monitoring -Reevaluation of the patient after these medicines showed that the patient improved -PCP office notes or outside notes reviewed -Discussion with specialist cardiology team who agrees to see pt and help determine disposition.  -Escalation to admission/observation considered: Dispo is currently pending cardiology team to evaluate and determine.  Patient is currently stable.       Final diagnoses:  Symptomatic bradycardia    ED Discharge Orders     None          Nivia Colon, PA-C 11/09/23 1528    Doretha Folks, MD 11/12/23 [phone]

## 2023-11-09 NOTE — ED Triage Notes (Addendum)
 Patient arrives via [facility] for near syncope during stress test at cardiology office. Patient was walking on treadmill for approximately when she began experiencing nausea, dizziness, and feeling lightheaded. 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 Patient endorses dizziness is not new symptom. Getting stress test for cardiology eval. No pacemaker. Patient endorses taking xarelto  but did not have dose this morning. Hx of PE.   BP 136/70  4mg  zofran  20 LAC

## 2023-11-11 ENCOUNTER — Ambulatory Visit

## 2023-11-11 ENCOUNTER — Telehealth: Payer: Self-pay

## 2023-11-11 VITALS — Ht 63.0 in | Wt 129.0 lb

## 2023-11-11 DIAGNOSIS — Z0000 Encounter for general adult medical examination without abnormal findings: Secondary | ICD-10-CM | POA: Diagnosis not present

## 2023-11-11 NOTE — Patient Instructions (Signed)
 Ms. Leah Bird , Thank you for taking time out of your busy schedule to complete your Annual Wellness Visit with me. I enjoyed our conversation and look forward to speaking with you again next year. I, as well as your care team,  appreciate your ongoing commitment to your health goals. Please review the following plan we discussed and let me know if I can assist you in the future. Your Game plan/ To Do List     Follow up Visits: We will see or speak with you next year for your Next Medicare AWV with our clinical staff Have you seen your provider in the last 6 months (3 months if uncontrolled diabetes)? Yes.  Next OV on 11/15/2023.  Clinician Recommendations:  Aim for 30 minutes of exercise or brisk walking, 6-8 glasses of water, and 5 servings of fruits and vegetables each day. You are due for a Hep C screening and can get that done when you have your next lab workup.      This is a list of the screenings recommended for you:  Health Maintenance  Topic Date Due   Medicare Annual Wellness Visit  Never done   Hepatitis C Screening  Never done   DEXA scan (bone density measurement)  Never done   Mammogram  03/10/2023   COVID-19 Vaccine (5 - Pfizer risk 2024-25 season) 07/24/2023   Flu Shot  11/04/2023   Cologuard (Stool DNA test)  01/28/2026   DTaP/Tdap/Td vaccine (3 - Td or Tdap) 10/11/2029   Pneumococcal Vaccine for age over 80  Completed   Hepatitis B Vaccine  Aged Out   HPV Vaccine  Aged Out   Meningitis B Vaccine  Aged Out   Colon Cancer Screening  Discontinued   Zoster (Shingles) Vaccine  Discontinued    Advanced directives: (Copy Requested) Please bring a copy of your health care power of attorney and living will to the office to be added to your chart at your convenience. You can mail to [facility] [address] [place], [place] [address] or email to [email or web address] Advance Care Planning is important because it:  [x]  Makes sure you receive the medical  care that is consistent with your values, goals, and preferences  [x]  It provides guidance to your family and loved ones and reduces their decisional burden about whether or not they are making the right decisions based on your wishes.  Follow the link provided in your after visit summary or read over the paperwork we have mailed to you to help you started getting your Advance Directives in place. If you need assistance in completing these, please reach out to us  so that we can help you!  See attachments for Preventive Care and Fall Prevention Tips.

## 2023-11-11 NOTE — Telephone Encounter (Signed)
 Called pt and went over PPM Implant date/time and Instructions. She is scheduled on 8/15 at 11:30 am with Dr. Waddell but will f/u with Dr. Inocencio.   Instruction letter sent via MyChart... pt will review and call back with any questions.   Pt aware to hold Xarelto  x 2 days.

## 2023-11-11 NOTE — Progress Notes (Signed)
 Subjective:   Leah Bird is a 73 y.o. who presents for a Medicare Wellness preventive visit.  As a reminder, Annual Wellness Visits don't include a physical exam, and some assessments may be limited, especially if this visit is performed virtually. We may recommend an in-person follow-up visit with your provider if needed.  Visit Complete: Virtual I connected with  Jerney B Sliwinski on 11/11/23 by a audio enabled telemedicine application and verified that I am speaking with the correct person using two identifiers.  Patient Location: Home  Provider Location: Office/Clinic  I discussed the limitations of evaluation and management by telemedicine. The patient expressed understanding and agreed to proceed.  Vital Signs: Because this visit was a virtual/telehealth visit, some criteria may be missing or patient reported. Any vitals not documented were not able to be obtained and vitals that have been documented are patient reported.  VideoDeclined- This patient declined Librarian, academic. Therefore the visit was completed with audio only.  Persons Participating in Visit: Patient.  AWV Questionnaire: Yes: Patient Medicare AWV questionnaire was completed by the patient on 11/10/2023; I have confirmed that all information answered by patient is correct and no changes since this date.  ALCOHOL SCREEN: NO CHANGES FROM 10/17/23-Per Pt  Cardiac Risk Factors include: advanced age (>75men, >53 women)     Objective:    Today's Vitals   11/11/23 1317  Weight: 129 lb (58.5 kg)  Height: 5' 3 (1.6 m)   Body mass index is 22.85 kg/m.     11/11/2023    1:22 PM 11/09/2023   10:56 AM 04/18/2023    3:57 PM 08/10/2022    9:56 AM 04/20/2022   10:38 AM 12/17/2021    9:51 AM 07/29/2021    8:39 AM  Advanced Directives  Does Patient Have a Medical Advance Directive? Yes Yes No Yes Yes Yes Yes  Type of Estate agent of Commerce;Living will Healthcare Power  of [place];Living will  Living will Healthcare Power of [place];Living will Living will;Healthcare Power of Attorney Living will;Healthcare Power of Attorney  Does patient want to make changes to medical advance directive?    No - Patient declined No - Patient declined No - Patient declined No - Patient declined  Copy of Healthcare Power of Attorney in Chart? No - copy requested    No - copy requested No - copy requested   Would patient like information on creating a medical advance directive?   No - Patient declined        Current Medications (verified) Outpatient Encounter Medications as of 11/11/2023  Medication Sig   b complex vitamins tablet Take 1 tablet by mouth daily.   Calcium  Citrate-Vitamin D  (CALCIUM  CITRATE +D PO) Take 1 tablet by mouth daily. 600 mg of calcium  citrate and 400 mg of D3   Cholecalciferol (VITAMIN D3) 50 MCG (2000 UT) capsule Take 2 capsules (4,000 Units total) by mouth daily. (Patient taking differently: Take 2,000 Units by mouth daily.)   folic acid  (FOLVITE ) 1 MG tablet Take 2 tablets (2 mg total) by mouth daily. (Patient taking differently: Take 800 mg by mouth daily.)   HYDROcodone -acetaminophen  (NORCO/VICODIN) 5-325 MG tablet Take 1 tablet by mouth every 6 (six) hours as needed for moderate pain (pain score 4-6).   ondansetron  (ZOFRAN -ODT) 4 MG disintegrating tablet Take 1 tablet (4 mg total) by mouth every 8 (eight) hours as needed for nausea or vomiting.   rosuvastatin  (CRESTOR ) 10 MG tablet TAKE 1 TABLET BY MOUTH  DAILY   XARELTO  10 MG TABS tablet TAKE 1 TABLET BY MOUTH DAILY   No facility-administered encounter medications on file as of 11/11/2023.    Allergies (verified) Other and Succinylcholine   History: Past Medical History:  Diagnosis Date   Arthritis    Chicken pox    COVID    Degenerative disk disease    whole spine-modic 1 spinal disease   DVT (deep venous thrombosis) (HCC)    Dyslipidemia    Hematoma    History of UTI    No pertinent  past medical history    Vitamin D  deficiency    Past Surgical History:  Procedure Laterality Date   DILATION AND CURETTAGE OF UTERUS  1990   Reacted to anesthesia/was on life support   EYE SURGERY     TUBAL LIGATION     WISDOM TOOTH EXTRACTION     under sedation   Family History  Problem Relation Age of Onset   Heart disease Mother    Arthritis Mother    Heart disease Father    Kidney disease Father    Heart attack Father    Arthritis Sister    Depression Sister    Diabetes Sister    Asthma Son    Hearing loss Son    Learning disabilities Son    Arthritis Maternal Grandmother    Hearing loss Maternal Grandfather    Hearing loss Paternal Grandfather    Heart attack Paternal Grandfather    Colon cancer Neg Hx    Rectal cancer Neg Hx    Stomach cancer Neg Hx    Social History   Socioeconomic History   Marital status: Married    Spouse name: Glean   Number of children: 3   Years of education: Not on file   Highest education level: Associate degree: academic program  Occupational History   Occupation: RETIRED  Tobacco Use   Smoking status: Never   Smokeless tobacco: Never  Vaping Use   Vaping status: Never Used  Substance and Sexual Activity   Alcohol use: Yes    Alcohol/week: 4.0 standard drinks of alcohol    Types: 4 Glasses of wine per week   Drug use: No   Sexual activity: Not on file  Other Topics Concern   Not on file  Social History Narrative   Lives with husband/2025   Social Drivers of Health   Financial Resource Strain: Low Risk  (11/11/2023)   Overall Financial Resource Strain (CARDIA)    Difficulty of Paying Living Expenses: Not hard at all  Food Insecurity: No Food Insecurity (11/11/2023)   Hunger Vital Sign    Worried About Running Out of Food in the Last Year: Never true    Ran Out of Food in the Last Year: Never true  Transportation Needs: No Transportation Needs (11/11/2023)   PRAPARE - Administrator, Civil Service (Medical): No     Lack of Transportation (Non-Medical): No  Physical Activity: Unknown (11/11/2023)   Exercise Vital Sign    Days of Exercise per Week: Patient declined    Minutes of Exercise per Session: Not on file  Stress: No Stress Concern Present (11/11/2023)   [employer] of Occupational Health - Occupational Stress Questionnaire    Feeling of Stress: Only a little  Social Connections: Moderately Isolated (11/11/2023)   Social Connection and Isolation Panel    Frequency of Communication with Friends and Family: More than three times a week    Frequency of Social Gatherings  with Friends and Family: More than three times a week    Attends Religious Services: Never    Active Member of Clubs or Organizations: No    Attends Engineer, structural: Not on file    Marital Status: Married    Tobacco Counseling Counseling given: Not Answered    Clinical Intake:  Pre-visit preparation completed: Yes  Pain : No/denies pain     BMI - recorded: 22.85 Nutritional Status: BMI of 19-24  Normal Nutritional Risks: None Diabetes: No  Lab Results  Component Value Date   HGBA1C 5.8 10/29/2019     How often do you need to have someone help you when you read instructions, pamphlets, or other written materials from your doctor or pharmacy?: 1 - Never  Interpreter Needed?: No  Information entered by :: Violetta Lavalle, RMA   Activities of Daily Living     11/10/2023    1:58 PM 10/14/2023    8:58 AM  In your present state of health, do you have any difficulty performing the following activities:  Hearing? 0 0  Vision? 0 0  Difficulty concentrating or making decisions? 0 0  Walking or climbing stairs? 0 0  Dressing or bathing? 0 0  Doing errands, shopping? 0 0  Preparing Food and eating ? N N  Using the Toilet? N N  In the past six months, have you accidently leaked urine? N N  Do you have problems with loss of bowel control? N N  Managing your Medications? N N  Managing your  Finances? N N  Housekeeping or managing your Housekeeping? N N    Patient Care Team: Plotnikov, Karlynn GAILS, MD as PCP - General (Internal Medicine) Jeffrie Oneil BROCKS, MD as PCP - Cardiology (Cardiology) Timmy Maude SAUNDERS, MD as Consulting Physician (Oncology)  I have updated your Care Teams any recent Medical Services you may have received from other providers in the past year.     Assessment:   This is a routine wellness examination for Taylor.  Hearing/Vision screen Hearing Screening - Comments:: Denies hearing difficulties   Vision Screening - Comments:: Denies vision issues./ Dr. Robinson    Goals Addressed   None    Depression Screen     11/11/2023    1:24 PM 06/28/2022    9:38 AM 05/03/2019   10:26 AM  PHQ 2/9 Scores  PHQ - 2 Score 0 0 0  PHQ- 9 Score 0      Fall Risk     11/10/2023    1:58 PM 10/14/2023    8:58 AM 06/28/2022    9:38 AM 07/17/2019    2:06 PM  Fall Risk   Falls in the past year? 1 0 1 0   Number falls in past yr: 0 0 0 0  Injury with Fall? 1 1 1  0  Risk for fall due to : Impaired balance/gait  History of fall(s)   Follow up Falls evaluation completed;Falls prevention discussed  Falls evaluation completed Falls evaluation completed      Data saved with a previous flowsheet row definition    MEDICARE RISK AT HOME:  Medicare Risk at Home Any stairs in or around the home?: (Patient-Rptd) Yes If so, are there any without handrails?: (Patient-Rptd) No Home free of loose throw rugs in walkways, pet beds, electrical cords, etc?: (Patient-Rptd) Yes Adequate lighting in your home to reduce risk of falls?: (Patient-Rptd) Yes Life alert?: (Patient-Rptd) No Use of a cane, walker or w/c?: (Patient-Rptd) No Grab bars  in the bathroom?: (Patient-Rptd) No Shower chair or bench in shower?: (Patient-Rptd) Yes Elevated toilet seat or a handicapped toilet?: (Patient-Rptd) Yes  TIMED UP AND GO:  Was the test performed?  No  Cognitive Function: Declined/Normal: No  cognitive concerns noted by patient or family. Patient alert, oriented, able to answer questions appropriately and recall recent events. No signs of memory loss or confusion.        Immunizations Immunization History  Administered Date(s) Administered   Influenza, High Dose Seasonal PF 01/23/2019, 01/15/2022   Influenza,inj,quad, With Preservative 11/03/2016   Influenza-Unspecified 01/15/2022, 01/23/2023   Moderna SARS-COV2 Booster Vaccination 10/17/2020   PFIZER(Purple Top)SARS-COV-2 Vaccination 04/30/2019, 05/21/2019   Pfizer(Comirnaty)Fall Seasonal Vaccine 12 years and older 01/15/2022, 01/23/2023   Pneumococcal Conjugate-13 12/13/2016   Pneumococcal Polysaccharide-23 10/12/2019   Tdap 08/23/2018, 10/12/2019   Unspecified SARS-COV-2 Vaccination 01/15/2022    Screening Tests Health Maintenance  Topic Date Due   Hepatitis C Screening  Never done   DEXA SCAN  Never done   MAMMOGRAM  03/10/2023   COVID-19 Vaccine (5 - Pfizer risk 2024-25 season) 07/24/2023   INFLUENZA VACCINE  11/04/2023   Medicare Annual Wellness (AWV)  11/10/2024   Fecal DNA (Cologuard)  01/28/2026   DTaP/Tdap/Td (3 - Td or Tdap) 10/11/2029   Pneumococcal Vaccine: 50+ Years  Completed   Hepatitis B Vaccines  Aged Out   HPV VACCINES  Aged Out   Meningococcal B Vaccine  Aged Out   Colonoscopy  Discontinued   Zoster Vaccines- Shingrix  Discontinued    Health Maintenance  Health Maintenance Due  Topic Date Due   Hepatitis C Screening  Never done   DEXA SCAN  Never done   MAMMOGRAM  03/10/2023   COVID-19 Vaccine (5 - Pfizer risk 2024-25 season) 07/24/2023   INFLUENZA VACCINE  11/04/2023   Health Maintenance Items Addressed: Labs Ordered: Hep C screening due  Additional Screening:  Vision Screening: Recommended annual ophthalmology exams for early detection of glaucoma and other disorders of the eye. Would you like a referral to an eye doctor? No    Dental Screening: Recommended annual dental exams  for proper oral hygiene  Community Resource Referral / Chronic Care Management: CRR required this visit?  No   CCM required this visit?  No   Plan:    I have personally reviewed and noted the following in the patient's chart:   Medical and social history Use of alcohol, tobacco or illicit drugs  Current medications and supplements including opioid prescriptions. Patient is not currently taking opioid prescriptions. Functional ability and status Nutritional status Physical activity Advanced directives List of other physicians Hospitalizations, surgeries, and ER visits in previous 12 months Vitals Screenings to include cognitive, depression, and falls Referrals and appointments  In addition, I have reviewed and discussed with patient certain preventive protocols, quality metrics, and best practice recommendations. A written personalized care plan for preventive services as well as general preventive health recommendations were provided to patient.   Esmee Fallaw L Syd Newsome, CMA   11/11/2023   After Visit Summary: (MyChart) Due to this being a telephonic visit, the after visit summary with patients personalized plan was offered to patient via MyChart   Notes: Patient stated that she is up to date with her mammogram and DEXA, which she get done with her GYN.  Patient will ask them to send over her records from screenings during her next office visit.  Patient is due for a Hep C screening and can get that done during her  next office visit here.

## 2023-11-13 ENCOUNTER — Encounter: Payer: Self-pay | Admitting: Cardiovascular Disease

## 2023-11-14 ENCOUNTER — Telehealth (HOSPITAL_COMMUNITY): Payer: Self-pay

## 2023-11-14 NOTE — Telephone Encounter (Signed)
 Spoke with patient to discuss upcoming procedure.   Confirmed patient is scheduled for a Permanent transvenous pacemaker (PPM) on Friday, August 15 with Dr. Danelle Birmingham. Instructed patient to arrive at the Main Entrance A at [facility]: [address] [place], [place] [address] and check in at Admitting at 9:30 AM.   Labs completed  Any recent signs of acute illness or been started on antibiotics?  No Any new medications started? No Any medications to hold? Hold Xarelto  for 2 days prior to your procedure. Your last dose will be August 12. Medication instructions:  On the morning of your procedure you may take all other morning medications not discussed with a small sip of water No eating or drinking after midnight prior to procedure.   The night before your procedure and the morning of your procedure, wash thoroughly with the CHG surgical soap or antibacterial soap from the neck down, paying special attention to the area where your procedure will be performed.  Advised of plan to go home the same day and will only stay overnight if medically necessary. You MUST have a responsible adult to drive you home and MUST be with you the first 24 hours after you arrive home.  Patient verbalized understanding to all instructions provided and agreed to proceed with procedure.

## 2023-11-14 NOTE — Telephone Encounter (Signed)
 Spoke with patient to discuss upcoming procedure.   Confirmed patient is scheduled for a Permanent transvenous pacemaker (PPM) on Friday, August 15 with Dr. Danelle Birmingham. Instructed patient to arrive at the Main Entrance A at [facility]: [address] [place], [place] [address] and check in at Admitting at 9:30 AM.   Labs completed  Any recent signs of acute illness or been started on antibiotics?  No Any new medications started? No Any medications to hold? Hold Xarelto  for 2 days prior to your procedure. Your last dose will be August 12. Medication instructions:  On the morning of your procedure you may take all other morning medications not discussed with a small sip of water No eating or drinking after midnight prior to procedure.   The night before your procedure and the morning of your procedure, wash thoroughly with the CHG surgical soap or antibacterial soap from the neck down, paying special attention to the area where your procedure will be performed.  Advised of plan to go home the same day and will only stay overnight if medically necessary. You MUST have a responsible adult to drive you home and MUST be with you the first 24 hours after you arrive home.  Patient verbalized understanding to all instructions provided and agreed to proceed with procedure.

## 2023-11-15 ENCOUNTER — Ambulatory Visit: Admitting: Internal Medicine

## 2023-11-15 ENCOUNTER — Encounter: Payer: Self-pay | Admitting: Internal Medicine

## 2023-11-15 ENCOUNTER — Telehealth: Payer: Self-pay | Admitting: Internal Medicine

## 2023-11-15 ENCOUNTER — Telehealth: Payer: Self-pay | Admitting: Home Health

## 2023-11-15 VITALS — BP 135/85 | HR 59 | Temp 97.7°F | Ht 63.0 in | Wt 129.0 lb

## 2023-11-15 DIAGNOSIS — R55 Syncope and collapse: Secondary | ICD-10-CM

## 2023-11-15 DIAGNOSIS — E538 Deficiency of other specified B group vitamins: Secondary | ICD-10-CM

## 2023-11-15 DIAGNOSIS — Z86718 Personal history of other venous thrombosis and embolism: Secondary | ICD-10-CM

## 2023-11-15 DIAGNOSIS — R42 Dizziness and giddiness: Secondary | ICD-10-CM | POA: Diagnosis not present

## 2023-11-15 NOTE — Telephone Encounter (Signed)
 Called by [employer] to notify us  that the report is posted currently and to notify us  of symptomatic 6-second pause on 7/24 at 9:30PM, and a symptomatic episode of Mobitz type 2 that lasted for 22 seconds on 7/19 6PM. Patient is currently scheduled for PPM on 8/15.   Gillian Cass, MD Cardiology

## 2023-11-15 NOTE — Progress Notes (Signed)
 Subjective:  Patient ID: Leah Bird, female    DOB: 20-Dec-1950  Age: 73 y.o. MRN: [id number]  CC: Medical Management of Chronic Issues (4 week f/u and discuss Heart Monitor results)   HPI Bindu B Beazley presents for bradycardia - pacemaker implant is planned for symptomatic bradycardia. Most recent EKG: Sinus bradycardia Prolonged PR interval Right bundle branch block Probable left ventricular hypertrophy with 2nd degree A-V block (Mobitz II), ventricular rate 40 Zio patch report is still pending  Almarie continues to have exertional weakness, lightheadedness, shortness of breath, dizziness   Outpatient Medications Prior to Visit  Medication Sig Dispense Refill   b complex vitamins tablet Take 1 tablet by mouth daily. 100 tablet 3   Calcium  Citrate-Vitamin D  (CALCIUM  CITRATE +D PO) Take 1 tablet by mouth daily. 600 mg of calcium  citrate and 400 mg of D3     HYDROcodone -acetaminophen  (NORCO/VICODIN) 5-325 MG tablet Take 1 tablet by mouth every 6 (six) hours as needed for moderate pain (pain score 4-6). 20 tablet 0   ondansetron  (ZOFRAN -ODT) 4 MG disintegrating tablet Take 1 tablet (4 mg total) by mouth every 8 (eight) hours as needed for nausea or vomiting. 20 tablet 1   rosuvastatin  (CRESTOR ) 10 MG tablet TAKE 1 TABLET BY MOUTH DAILY 90 tablet 3   XARELTO  10 MG TABS tablet TAKE 1 TABLET BY MOUTH DAILY 90 tablet 3   Cholecalciferol (VITAMIN D3) 50 MCG (2000 UT) capsule Take 2 capsules (4,000 Units total) by mouth daily. (Patient taking differently: Take 2,000 Units by mouth daily.) 100 capsule 3   folic acid  (FOLVITE ) 1 MG tablet Take 2 tablets (2 mg total) by mouth daily. (Patient taking differently: Take 800 mg by mouth daily.) 180 tablet 6   No facility-administered medications prior to visit.    ROS: Review of Systems  Constitutional:  Negative for activity change, appetite change, chills, fatigue and unexpected weight change.  HENT:  Negative for congestion, mouth sores and  sinus pressure.   Eyes:  Negative for visual disturbance.  Respiratory:  Negative for cough and chest tightness.   Gastrointestinal:  Negative for abdominal pain and nausea.  Genitourinary:  Negative for difficulty urinating, frequency and vaginal pain.  Musculoskeletal:  Negative for back pain and gait problem.  Skin:  Negative for pallor and rash.  Neurological:  Positive for dizziness and light-headedness. Negative for tremors, syncope, weakness, numbness and headaches.  Psychiatric/Behavioral:  Negative for confusion, sleep disturbance and suicidal ideas. The patient is not nervous/anxious.     Objective:  BP 135/85   Pulse (!) 59   Temp 97.7 F (36.5 C) (Oral)   Ht 5' 3 (1.6 m)   Wt 129 lb (58.5 kg)   SpO2 99%   BMI 22.85 kg/m   BP Readings from Last 3 Encounters:  11/15/23 135/85  11/09/23 115/60  10/18/23 110/70    Wt Readings from Last 3 Encounters:  11/15/23 129 lb (58.5 kg)  11/11/23 129 lb (58.5 kg)  11/09/23 129 lb (58.5 kg)    Physical Exam Constitutional:      General: She is not in acute distress.    Appearance: Normal appearance. She is well-developed.  HENT:     Head: Normocephalic.     Right Ear: External ear normal.     Left Ear: External ear normal.     Nose: Nose normal.  Eyes:     General:        Right eye: No discharge.  Left eye: No discharge.     Conjunctiva/sclera: Conjunctivae normal.     Pupils: Pupils are equal, round, and reactive to light.  Neck:     Thyroid : No thyromegaly.     Vascular: No JVD.     Trachea: No tracheal deviation.  Cardiovascular:     Rate and Rhythm: Regular rhythm. Bradycardia present.     Heart sounds: Normal heart sounds.  Pulmonary:     Effort: No respiratory distress.     Breath sounds: No stridor. No wheezing.  Abdominal:     General: Bowel sounds are normal. There is no distension.     Palpations: Abdomen is soft. There is no mass.     Tenderness: There is no abdominal tenderness. There is no  guarding or rebound.  Musculoskeletal:        General: No tenderness.     Cervical back: Normal range of motion and neck supple. No rigidity.  Lymphadenopathy:     Cervical: No cervical adenopathy.  Skin:    Findings: No erythema or rash.  Neurological:     Mental Status: She is oriented to person, place, and time.     Cranial Nerves: No cranial nerve deficit.     Motor: No abnormal muscle tone.     Coordination: Coordination normal.     Deep Tendon Reflexes: Reflexes normal.  Psychiatric:        Behavior: Behavior normal.        Thought Content: Thought content normal.        Judgment: Judgment normal.     Lab Results  Component Value Date   WBC 9.1 11/09/2023   HGB 14.3 11/09/2023   HCT 44.2 11/09/2023   PLT 333 11/09/2023   GLUCOSE 101 (H) 11/09/2023   CHOL 150 11/04/2022   TRIG 63 11/04/2022   HDL 69 11/04/2022   LDLCALC 68 11/04/2022   ALT 19 10/18/2023   AST 20 10/18/2023   NA 139 11/09/2023   K 4.1 11/09/2023   CL 103 11/09/2023   CREATININE 0.75 11/09/2023   BUN 15 11/09/2023   CO2 27 11/09/2023   TSH 3.27 10/18/2023   INR 0.91 04/23/2011   HGBA1C 5.8 10/29/2019    EXERCISE TOLERANCE TEST (ETT) Result Date: 11/09/2023   Baseline EKG with second degree AV block mobtiz 2. The patient exercised for 1:22 min:s without increase in heart rate and continued evidence of 2:1 block. Some of the recovery tracings concerning for intermittent complete heart block vs infrahisian high-grade conduction disease. The patient was sent to the ER given symptomatic 2:1 block.   DG Chest Port 1 View Result Date: 11/09/2023 CLINICAL DATA:  near syncope. EXAM: PORTABLE CHEST 1 VIEW COMPARISON:  10/19/2023. FINDINGS: Bilateral lungs appear hyperlucent with coarse bronchovascular markings, in keeping with COPD. Bilateral lungs otherwise appear clear. No dense consolidation or lung collapse. Bilateral costophrenic angles are clear. Normal cardio-mediastinal silhouette. No acute osseous  abnormalities. The soft tissues are within normal limits. IMPRESSION: No active disease. Electronically Signed   By: Ree Molt M.D.   On: 11/09/2023 11:29    Assessment & Plan:   Problem List Items Addressed This Visit     History of DVT (deep vein thrombosis)   Continue Xarelto  long-term, hold Xarelto  for the procedure this week       Lightheadedness   Symptomatic bradycardia - pacemaker implant is planned  -Dr. Waddell Most recent EKG: Sinus bradycardia Prolonged PR interval Right bundle branch block Probable left ventricular hypertrophy with 2nd  degree A-V block (Mobitz II), ventricular rate 40 Zio patch report is still pending -will try to obtain Zio patch report ASAP      Low serum vitamin B12 - Primary   On vitamin B complex      Near syncope   Symptomatic bradycardia - pacemaker implant is planned  -Dr. Waddell Most recent EKG: Sinus bradycardia Prolonged PR interval Right bundle branch block Probable left ventricular hypertrophy with 2nd degree A-V block (Mobitz II), ventricular rate 40 Zio patch report is still pending -will try to obtain Zio patch report ASAP         No orders of the defined types were placed in this encounter.   Virtual Visit via Video Note  I connected with Zyara B Olivarez on 11/15/23 at  9:30 AM EDT by a video enabled telemedicine application and verified that I am speaking with the correct person using two identifiers.   I discussed the limitations of evaluation and management by telemedicine and the availability of in person appointments. The patient expressed understanding and agreed to proceed.  I was located at our [facility] office. The patient was at home. There was no one else present in the visit.  Chief Complaint  Patient presents with   Medical Management of Chronic Issues    4 week f/u and discuss Heart Monitor results     History of Present Illness:   Review of Systems  Constitutional:  Negative for activity change,  appetite change, chills, fatigue and unexpected weight change.  HENT:  Negative for congestion, mouth sores and sinus pressure.   Eyes:  Negative for visual disturbance.  Respiratory:  Negative for cough and chest tightness.   Gastrointestinal:  Negative for abdominal pain and nausea.  Genitourinary:  Negative for difficulty urinating, frequency and vaginal pain.  Musculoskeletal:  Negative for back pain and gait problem.  Skin:  Negative for pallor and rash.  Neurological:  Positive for dizziness and light-headedness. Negative for tremors, syncope, weakness, numbness and headaches.  Psychiatric/Behavioral:  Negative for confusion, sleep disturbance and suicidal ideas. The patient is not nervous/anxious.      Observations/Objective: The patient appears to be in no acute distress  Assessment and Plan:  Problem List Items Addressed This Visit     History of DVT (deep vein thrombosis)   Continue Xarelto  long-term, hold Xarelto  for the procedure this week       Lightheadedness   Symptomatic bradycardia - pacemaker implant is planned  -Dr. Waddell Most recent EKG: Sinus bradycardia Prolonged PR interval Right bundle branch block Probable left ventricular hypertrophy with 2nd degree A-V block (Mobitz II), ventricular rate 40 Zio patch report is still pending -will try to obtain Zio patch report ASAP      Low serum vitamin B12 - Primary   On vitamin B complex      Near syncope   Symptomatic bradycardia - pacemaker implant is planned  -Dr. Waddell Most recent EKG: Sinus bradycardia Prolonged PR interval Right bundle branch block Probable left ventricular hypertrophy with 2nd degree A-V block (Mobitz II), ventricular rate 40 Zio patch report is still pending -will try to obtain Zio patch report ASAP        No orders of the defined types were placed in this encounter.    Follow Up Instructions:    I discussed the assessment and treatment plan with the patient. The patient was  provided an opportunity to ask questions and all were answered. The patient agreed with the plan  and demonstrated an understanding of the instructions.   The patient was advised to call back or seek an in-person evaluation if the symptoms worsen or if the condition fails to improve as anticipated.  I provided face-to-face time during this encounter. We were at different locations.   Marolyn Noel, MD   Follow-up: Return in about 3 months (around 02/15/2024) for a follow-up visit.  Marolyn Noel, MD

## 2023-11-15 NOTE — Assessment & Plan Note (Addendum)
 On vitamin B complex

## 2023-11-15 NOTE — Assessment & Plan Note (Signed)
 Symptomatic bradycardia - pacemaker implant is planned  -Dr. Waddell Most recent EKG: Sinus bradycardia Prolonged PR interval Right bundle branch block Probable left ventricular hypertrophy with 2nd degree A-V block (Mobitz II), ventricular rate 40 Zio patch report is still pending -will try to obtain Zio patch report ASAP

## 2023-11-15 NOTE — Progress Notes (Signed)
 Subjective:  Patient ID: Leah Bird, female    DOB: 20-Dec-1950  Age: 73 y.o. MRN: [id number]  CC: Medical Management of Chronic Issues (4 week f/u and discuss Heart Monitor results)   HPI Leah Bird presents for bradycardia - pacemaker implant is planned for symptomatic bradycardia. Most recent EKG: Sinus bradycardia Prolonged PR interval Right bundle branch block Probable left ventricular hypertrophy with 2nd degree A-V block (Mobitz II), ventricular rate 40 Zio patch report is still pending  Leah Bird continues to have exertional weakness, lightheadedness, shortness of breath, dizziness   Outpatient Medications Prior to Visit  Medication Sig Dispense Refill   b complex vitamins tablet Take 1 tablet by mouth daily. 100 tablet 3   Calcium  Citrate-Vitamin D  (CALCIUM  CITRATE +D PO) Take 1 tablet by mouth daily. 600 mg of calcium  citrate and 400 mg of D3     HYDROcodone -acetaminophen  (NORCO/VICODIN) 5-325 MG tablet Take 1 tablet by mouth every 6 (six) hours as needed for moderate pain (pain score 4-6). 20 tablet 0   ondansetron  (ZOFRAN -ODT) 4 MG disintegrating tablet Take 1 tablet (4 mg total) by mouth every 8 (eight) hours as needed for nausea or vomiting. 20 tablet 1   rosuvastatin  (CRESTOR ) 10 MG tablet TAKE 1 TABLET BY MOUTH DAILY 90 tablet 3   XARELTO  10 MG TABS tablet TAKE 1 TABLET BY MOUTH DAILY 90 tablet 3   Cholecalciferol (VITAMIN D3) 50 MCG (2000 UT) capsule Take 2 capsules (4,000 Units total) by mouth daily. (Patient taking differently: Take 2,000 Units by mouth daily.) 100 capsule 3   folic acid  (FOLVITE ) 1 MG tablet Take 2 tablets (2 mg total) by mouth daily. (Patient taking differently: Take 800 mg by mouth daily.) 180 tablet 6   No facility-administered medications prior to visit.    ROS: Review of Systems  Constitutional:  Negative for activity change, appetite change, chills, fatigue and unexpected weight change.  HENT:  Negative for congestion, mouth sores and  sinus pressure.   Eyes:  Negative for visual disturbance.  Respiratory:  Negative for cough and chest tightness.   Gastrointestinal:  Negative for abdominal pain and nausea.  Genitourinary:  Negative for difficulty urinating, frequency and vaginal pain.  Musculoskeletal:  Negative for back pain and gait problem.  Skin:  Negative for pallor and rash.  Neurological:  Positive for dizziness and light-headedness. Negative for tremors, syncope, weakness, numbness and headaches.  Psychiatric/Behavioral:  Negative for confusion, sleep disturbance and suicidal ideas. The patient is not nervous/anxious.     Objective:  BP 135/85   Pulse (!) 59   Temp 97.7 F (36.5 C) (Oral)   Ht 5' 3 (1.6 m)   Wt 129 lb (58.5 kg)   SpO2 99%   BMI 22.85 kg/m   BP Readings from Last 3 Encounters:  11/15/23 135/85  11/09/23 115/60  10/18/23 110/70    Wt Readings from Last 3 Encounters:  11/15/23 129 lb (58.5 kg)  11/11/23 129 lb (58.5 kg)  11/09/23 129 lb (58.5 kg)    Physical Exam Constitutional:      General: She is not in acute distress.    Appearance: Normal appearance. She is well-developed.  HENT:     Head: Normocephalic.     Right Ear: External ear normal.     Left Ear: External ear normal.     Nose: Nose normal.  Eyes:     General:        Right eye: No discharge.  Left eye: No discharge.     Conjunctiva/sclera: Conjunctivae normal.     Pupils: Pupils are equal, round, and reactive to light.  Neck:     Thyroid : No thyromegaly.     Vascular: No JVD.     Trachea: No tracheal deviation.  Cardiovascular:     Rate and Rhythm: Regular rhythm. Bradycardia present.     Heart sounds: Normal heart sounds.  Pulmonary:     Effort: No respiratory distress.     Breath sounds: No stridor. No wheezing.  Abdominal:     General: Bowel sounds are normal. There is no distension.     Palpations: Abdomen is soft. There is no mass.     Tenderness: There is no abdominal tenderness. There is no  guarding or rebound.  Musculoskeletal:        General: No tenderness.     Cervical back: Normal range of motion and neck supple. No rigidity.  Lymphadenopathy:     Cervical: No cervical adenopathy.  Skin:    Findings: No erythema or rash.  Neurological:     Mental Status: She is oriented to person, place, and time.     Cranial Nerves: No cranial nerve deficit.     Motor: No abnormal muscle tone.     Coordination: Coordination normal.     Deep Tendon Reflexes: Reflexes normal.  Psychiatric:        Behavior: Behavior normal.        Thought Content: Thought content normal.        Judgment: Judgment normal.     Lab Results  Component Value Date   WBC 9.1 11/09/2023   HGB 14.3 11/09/2023   HCT 44.2 11/09/2023   PLT 333 11/09/2023   GLUCOSE 101 (H) 11/09/2023   CHOL 150 11/04/2022   TRIG 63 11/04/2022   HDL 69 11/04/2022   LDLCALC 68 11/04/2022   ALT 19 10/18/2023   AST 20 10/18/2023   NA 139 11/09/2023   K 4.1 11/09/2023   CL 103 11/09/2023   CREATININE 0.75 11/09/2023   BUN 15 11/09/2023   CO2 27 11/09/2023   TSH 3.27 10/18/2023   INR 0.91 04/23/2011   HGBA1C 5.8 10/29/2019    EXERCISE TOLERANCE TEST (ETT) Result Date: 11/09/2023   Baseline EKG with second degree AV block mobtiz 2. The patient exercised for 1:22 min:s without increase in heart rate and continued evidence of 2:1 block. Some of the recovery tracings concerning for intermittent complete heart block vs infrahisian high-grade conduction disease. The patient was sent to the ER given symptomatic 2:1 block.   DG Chest Port 1 View Result Date: 11/09/2023 CLINICAL DATA:  near syncope. EXAM: PORTABLE CHEST 1 VIEW COMPARISON:  10/19/2023. FINDINGS: Bilateral lungs appear hyperlucent with coarse bronchovascular markings, in keeping with COPD. Bilateral lungs otherwise appear clear. No dense consolidation or lung collapse. Bilateral costophrenic angles are clear. Normal cardio-mediastinal silhouette. No acute osseous  abnormalities. The soft tissues are within normal limits. IMPRESSION: No active disease. Electronically Signed   By: Ree Molt M.D.   On: 11/09/2023 11:29    Assessment & Plan:   Problem List Items Addressed This Visit     History of DVT (deep vein thrombosis)   Continue Xarelto  long-term, hold Xarelto  for the procedure this week       Lightheadedness   Symptomatic bradycardia - pacemaker implant is planned  -Dr. Waddell Most recent EKG: Sinus bradycardia Prolonged PR interval Right bundle branch block Probable left ventricular hypertrophy with 2nd  degree A-V block (Mobitz II), ventricular rate 40 Zio patch report is still pending -will try to obtain Zio patch report ASAP      Low serum vitamin B12 - Primary   On vitamin B complex      Near syncope   Symptomatic bradycardia - pacemaker implant is planned  -Dr. Waddell Most recent EKG: Sinus bradycardia Prolonged PR interval Right bundle branch block Probable left ventricular hypertrophy with 2nd degree A-V block (Mobitz II), ventricular rate 40 Zio patch report is still pending -will try to obtain Zio patch report ASAP         No orders of the defined types were placed in this encounter.   Virtual Visit via Video Note  I connected with Leah Bird on 11/15/23 at  9:30 AM EDT by a video enabled telemedicine application and verified that I am speaking with the correct person using two identifiers.   I discussed the limitations of evaluation and management by telemedicine and the availability of in person appointments. The patient expressed understanding and agreed to proceed.  I was located at our [facility] office. The patient was at home. There was no one else present in the visit.  Chief Complaint  Patient presents with   Medical Management of Chronic Issues    4 week f/u and discuss Heart Monitor results     History of Present Illness:   Review of Systems  Constitutional:  Negative for activity change,  appetite change, chills, fatigue and unexpected weight change.  HENT:  Negative for congestion, mouth sores and sinus pressure.   Eyes:  Negative for visual disturbance.  Respiratory:  Negative for cough and chest tightness.   Gastrointestinal:  Negative for abdominal pain and nausea.  Genitourinary:  Negative for difficulty urinating, frequency and vaginal pain.  Musculoskeletal:  Negative for back pain and gait problem.  Skin:  Negative for pallor and rash.  Neurological:  Positive for dizziness and light-headedness. Negative for tremors, syncope, weakness, numbness and headaches.  Psychiatric/Behavioral:  Negative for confusion, sleep disturbance and suicidal ideas. The patient is not nervous/anxious.      Observations/Objective: The patient appears to be in no acute distress  Assessment and Plan:  Problem List Items Addressed This Visit     History of DVT (deep vein thrombosis)   Continue Xarelto  long-term, hold Xarelto  for the procedure this week       Lightheadedness   Symptomatic bradycardia - pacemaker implant is planned  -Dr. Waddell Most recent EKG: Sinus bradycardia Prolonged PR interval Right bundle branch block Probable left ventricular hypertrophy with 2nd degree A-V block (Mobitz II), ventricular rate 40 Zio patch report is still pending -will try to obtain Zio patch report ASAP      Low serum vitamin B12 - Primary   On vitamin B complex      Near syncope   Symptomatic bradycardia - pacemaker implant is planned  -Dr. Waddell Most recent EKG: Sinus bradycardia Prolonged PR interval Right bundle branch block Probable left ventricular hypertrophy with 2nd degree A-V block (Mobitz II), ventricular rate 40 Zio patch report is still pending -will try to obtain Zio patch report ASAP        No orders of the defined types were placed in this encounter.    Follow Up Instructions:    I discussed the assessment and treatment plan with the patient. The patient was  provided an opportunity to ask questions and all were answered. The patient agreed with the plan  and demonstrated an understanding of the instructions.   The patient was advised to call back or seek an in-person evaluation if the symptoms worsen or if the condition fails to improve as anticipated.  I provided face-to-face time during this encounter. We were at different locations.   Marolyn Noel, MD   Follow-up: Return in about 3 months (around 02/15/2024) for a follow-up visit.  Marolyn Noel, MD

## 2023-11-15 NOTE — Telephone Encounter (Signed)
 Paged by [facility] regarding abnormal Zio finding , called back at  [phone], on hold for > 5 minutes without answering. Signed out to fellow tonight to try reach back at later time.

## 2023-11-15 NOTE — Assessment & Plan Note (Signed)
 Continue Xarelto  long-term, hold Xarelto  for the procedure this week

## 2023-11-18 ENCOUNTER — Ambulatory Visit (HOSPITAL_COMMUNITY)

## 2023-11-18 ENCOUNTER — Other Ambulatory Visit: Payer: Self-pay

## 2023-11-18 ENCOUNTER — Ambulatory Visit (HOSPITAL_COMMUNITY)
Admission: RE | Disposition: A | Discharge status: Home / Self Care | Payer: Self-pay | Source: Home / Self Care | Attending: Internal Medicine

## 2023-11-18 ENCOUNTER — Ambulatory Visit (HOSPITAL_COMMUNITY)
Admission: RE | Admit: 2023-11-18 | Discharge: 2023-11-18 | Disposition: A | Discharge status: Home / Self Care | Attending: Internal Medicine | Admitting: Internal Medicine

## 2023-11-18 DIAGNOSIS — Z86718 Personal history of other venous thrombosis and embolism: Secondary | ICD-10-CM | POA: Insufficient documentation

## 2023-11-18 DIAGNOSIS — Z7901 Long term (current) use of anticoagulants: Secondary | ICD-10-CM | POA: Insufficient documentation

## 2023-11-18 DIAGNOSIS — Z950 Presence of cardiac pacemaker: Secondary | ICD-10-CM | POA: Diagnosis not present

## 2023-11-18 DIAGNOSIS — R918 Other nonspecific abnormal finding of lung field: Secondary | ICD-10-CM | POA: Diagnosis not present

## 2023-11-18 DIAGNOSIS — Z79899 Other long term (current) drug therapy: Secondary | ICD-10-CM | POA: Insufficient documentation

## 2023-11-18 DIAGNOSIS — R55 Syncope and collapse: Secondary | ICD-10-CM | POA: Diagnosis not present

## 2023-11-18 DIAGNOSIS — R0609 Other forms of dyspnea: Secondary | ICD-10-CM | POA: Insufficient documentation

## 2023-11-18 DIAGNOSIS — I442 Atrioventricular block, complete: Secondary | ICD-10-CM

## 2023-11-18 DIAGNOSIS — I2510 Atherosclerotic heart disease of native coronary artery without angina pectoris: Secondary | ICD-10-CM | POA: Insufficient documentation

## 2023-11-18 HISTORY — PX: PACEMAKER IMPLANT: EP1218

## 2023-11-18 SURGERY — PACEMAKER IMPLANT

## 2023-11-18 MED ORDER — FENTANYL CITRATE (PF) 100 MCG/2ML IJ SOLN
INTRAMUSCULAR | Status: AC
Start: 2023-11-18 — End: 2023-11-18
  Filled 2023-11-18: qty 2

## 2023-11-18 MED ORDER — CEFAZOLIN SODIUM-DEXTROSE 1-4 GM/50ML-% IV SOLN: 1.0000 g | Freq: Four times a day (QID) | INTRAVENOUS | Status: DC

## 2023-11-18 MED ORDER — MIDAZOLAM HCL 2 MG/2ML IJ SOLN
INTRAMUSCULAR | Status: AC
  Filled 2023-11-18: qty 2

## 2023-11-18 MED ORDER — CEFAZOLIN SODIUM-DEXTROSE 2-4 GM/100ML-% IV SOLN
2.0000 g | INTRAVENOUS | Status: AC
  Administered 2023-11-18: 2 g via INTRAVENOUS

## 2023-11-18 MED ORDER — LIDOCAINE HCL (PF) 1 % IJ SOLN
INTRAMUSCULAR | Status: DC | PRN
  Administered 2023-11-18: 60 mL

## 2023-11-18 MED ORDER — CHLORHEXIDINE GLUCONATE 4 % EX SOLN: 4.0000 | Freq: Once | CUTANEOUS | Status: DC

## 2023-11-18 MED ORDER — SODIUM CHLORIDE 0.9 % IV SOLN
80.0000 mg | INTRAVENOUS | Status: AC
  Administered 2023-11-18: 80 mg

## 2023-11-18 MED ORDER — CEFAZOLIN SODIUM-DEXTROSE 2-4 GM/100ML-% IV SOLN
INTRAVENOUS | Status: AC
  Filled 2023-11-18: qty 100

## 2023-11-18 MED ORDER — SODIUM CHLORIDE 0.9 % IV SOLN
INTRAVENOUS | Status: AC
  Filled 2023-11-18: qty 2

## 2023-11-18 MED ORDER — HEPARIN (PORCINE) IN NACL 1000-0.9 UT/500ML-% IV SOLN
INTRAVENOUS | Status: DC | PRN
  Administered 2023-11-18: 500 mL

## 2023-11-18 MED ORDER — SODIUM CHLORIDE 0.9 % IV SOLN: INTRAVENOUS | Status: DC

## 2023-11-18 MED ORDER — FENTANYL CITRATE (PF) 100 MCG/2ML IJ SOLN
INTRAMUSCULAR | Status: DC | PRN
  Administered 2023-11-18 (×3): 12.5 ug via INTRAVENOUS

## 2023-11-18 MED ORDER — ONDANSETRON HCL 4 MG/2ML IJ SOLN: 4.0000 mg | Freq: Four times a day (QID) | INTRAMUSCULAR | Status: DC | PRN

## 2023-11-18 MED ORDER — POVIDONE-IODINE 10 % EX SWAB
2.0000 | Freq: Once | CUTANEOUS | Status: AC
  Administered 2023-11-18: 2 via TOPICAL

## 2023-11-18 MED ORDER — LIDOCAINE HCL (PF) 1 % IJ SOLN
INTRAMUSCULAR | Status: AC
  Filled 2023-11-18: qty 60

## 2023-11-18 MED ORDER — MIDAZOLAM HCL 5 MG/5ML IJ SOLN
INTRAMUSCULAR | Status: DC | PRN
  Administered 2023-11-18 (×3): 1 mg via INTRAVENOUS

## 2023-11-18 MED ORDER — ACETAMINOPHEN 325 MG PO TABS
325.0000 mg | ORAL_TABLET | ORAL | Status: DC | PRN
  Administered 2023-11-18: 650 mg via ORAL
  Filled 2023-11-18: qty 2

## 2023-11-18 SURGICAL SUPPLY — 11 items
CABLE SURGICAL S-101-97-12 (CABLE) ×2 IMPLANT
CATH RIGHTSITE C315HIS02 (CATHETERS) IMPLANT
IPG PACE AZUR XT DR MRI W1DR01 (Pacemaker) IMPLANT
KIT MICROPUNCTURE NIT STIFF (SHEATH) IMPLANT
LEAD CAPSURE NOVUS 5076-52CM (Lead) IMPLANT
LEAD SELECT SECURE 3830 383069 (Lead) IMPLANT
PAD DEFIB RADIO PHYSIO CONN (PAD) ×2 IMPLANT
SHEATH 7FR PRELUDE SNAP 13 (SHEATH) IMPLANT
SLITTER 6232ADJ (MISCELLANEOUS) IMPLANT
TRAY PACEMAKER INSERTION (PACKS) ×2 IMPLANT
WIRE HI TORQ VERSACORE-J 145CM (WIRE) IMPLANT

## 2023-11-18 NOTE — Discharge Instructions (Addendum)
 After Your Pacemaker   You have a Medtronic Pacemaker  ACTIVITY Do not lift your arm above shoulder height for 1 week after your procedure. After 7 days, you may progress as below.  You should remove your sling 24 hours after your procedure, unless otherwise instructed by your provider.     Friday November 25, 2023  Saturday November 26, 2023 Sunday November 27, 2023 Monday November 28, 2023   Do not lift, push, pull, or carry anything over 10 pounds with the affected arm until 6 weeks (Friday December 30, 2023 ) after your procedure.   You may drive AFTER your wound check, unless you have been told otherwise by your provider.   Ask your healthcare provider when you can go back to work   INCISION/Dressing If you are on a blood thinner such as Coumadin, Xarelto , Eliquis, Plavix, or Pradaxa please confirm with your provider when this should be resumed. 11/23/2023  If large square, outer bandage is left in place, this can be removed after 24 hours from your procedure. Do not remove steri-strips or glue as below.   If a PRESSURE DRESSING (a bulky dressing that usually goes up over your shoulder) was applied or left in place, please follow instructions given by your provider on when to return to have this removed.   Monitor your Pacemaker site for redness, swelling, and drainage. Call the device clinic at [phone] if you experience these symptoms or fever/chills.  If your incision is sealed with Steri-strips or staples, you may shower 7 days after your procedure or when told by your provider. Do not remove the steri-strips or let the shower hit directly on your site. You may wash around your site with soap and water.    If you were discharged in a sling, please do not wear this during the day more than 48 hours after your surgery unless otherwise instructed. This may increase the risk of stiffness and soreness in your shoulder.   Avoid lotions, ointments, or perfumes over your incision until  it is well-healed.  You may use a hot tub or a pool AFTER your wound check appointment if the incision is completely closed.  Pacemaker Alerts:  Some alerts are vibratory and others beep. These are NOT emergencies. Please call our office to let us  know. If this occurs at night or on weekends, it can wait until the next business day. Send a remote transmission.  If your device is capable of reading fluid status (for heart failure), you will be offered monthly monitoring to review this with you.   DEVICE MANAGEMENT Remote monitoring is used to monitor your pacemaker from home. This monitoring is scheduled every 91 days by our office. It allows us  to keep an eye on the functioning of your device to ensure it is working properly. You will routinely see your Electrophysiologist annually (more often if necessary).   You should receive your ID card for your new device in 4-8 weeks. Keep this card with you at all times once received. Consider wearing a medical alert bracelet or necklace.  Your Pacemaker may be MRI compatible. This will be discussed at your next office visit/wound check.  You should avoid contact with strong electric or magnetic fields.   Do not use amateur (ham) radio equipment or electric (arc) welding torches. MP3 player headphones with magnets should not be used. Some devices are safe to use if held at least 12 inches (30 cm) from your Pacemaker. These include power tools, lawn  mowers, and speakers. If you are unsure if something is safe to use, ask your health care provider.  When using your cell phone, hold it to the ear that is on the opposite side from the Pacemaker. Do not leave your cell phone in a pocket over the Pacemaker.  You may safely use electric blankets, heating pads, computers, and microwave ovens.  Call the office right away if: You have chest pain. You feel more short of breath than you have felt before. You feel more light-headed than you have felt before. Your  incision starts to open up.  This information is not intended to replace advice given to you by your health care provider. Make sure you discuss any questions you have with your health care provider.

## 2023-11-18 NOTE — H&P (Signed)
 Cardiology Office Note:  .   Date:  11/07/2023  ID:  Leah Bird, DOB 04-26-1950, MRN [id number] PCP: Garald Karlynn GAILS, MD  [facility] Providers Cardiologist:  Oneil Parchment, MD    History of Present Illness: .   Leah Bird is a 73 y.o. female Discussed the use of AI scribe software for clinical note transcription with the patient, who gave verbal consent to proceed.   History of Present Illness Leah Bird is a 72 year old female with coronary artery disease who presents with worsening shortness of breath and dizziness post-hip surgery. She was referred by Dr. Garald for evaluation of her cardiac symptoms.   Dyspnea and dizziness - Worsening shortness of breath and dizziness since hip surgery in 2025, she has been feeling the symptoms even prior to this. - Dyspnea on exertion, particularly after exercise - Episodes of feeling faint and lightheaded, especially when bending down and standing up - Describes episodes as 'scary as hell' and 'like wires burning' in her head - Symptoms are persistent and impact ability to walk and perform daily activities   Coronary artery disease and cardiac evaluation - History of coronary artery disease with prior coronary calcium  score of 96 - Moderate coronary stenosis on coronary CT scan but FFR negative. - Echocardiogram previously showed normal ejection fraction/function - CT angiogram of the chest showed no evidence of pulmonary embolism   Anticoagulation therapy - On Xarelto  10 mg daily for prophylaxis against recurrent DVT and PE   Cardiac rhythm monitoring - Wore Zio patch monitor for two weeks; results pending - Not on any AV nodal blocking agents   Prior diagnostic workup - D-dimer, orthostatic blood pressures, hemoglobin, chemistry panel, thyroid  function tests, and cortisol level all unremarkable - Brain CT negative for bleeding, mass, or stroke     Obviously she is very frustrated with her symptoms which have  been ongoing and somewhat progressive.       Studies Reviewed: SABRA   EKG Interpretation Date/Time:                  Monday November 07 2023 10:16:25 EDT Ventricular Rate:         52 PR Interval:                 294 QRS Duration:             128 QT Interval:                 436 QTC Calculation:405 R Axis:                         -33   Text Interpretation:       Critical Test Result: AV Block Sinus rhythm with 2nd degree A-V block (Mobitz II) with 2:1 A-V conduction as well as second degree HB Type 1 Left axis deviation Right bundle branch block Minimal voltage criteria for LVH, may be normal variant ( R in aVL ) Confirmed by Parchment Oneil ([id number]) on 11/07/2023 10:27:45 AM     Results RADIOLOGY Coronary CT: Moderate coronary stenosis, no severe obstruction Brain CT: Negative for bleeding, mass, stroke Chest CT angiogram: No evidence of pulmonary embolism   DIAGNOSTIC EKG: Sinus rhythm, first-degree AV block, PR interval 294 ms, second-degree heart block type 1, 2:1 AV block, average heart rate 52 bpm, right bundle branch block, left axis deviation (11/07/2023) Risk Assessment/Calculations:  Physical Exam:   VS:  Pulse (!) 50   Ht 5' 3 (1.6 m)   Wt 129 lb (58.5 kg)   SpO2 100%   BMI 22.85 kg/m       Wt Readings from Last 3 Encounters:  11/07/23 129 lb (58.5 kg)  10/18/23 129 lb (58.5 kg)  09/12/23 130 lb 12.8 oz (59.3 kg)    GEN: Well nourished, well developed in no acute distress NECK: No JVD; No carotid bruits CARDIAC: Nola reg with occasional ectopy, no murmurs, no rubs, no gallops RESPIRATORY:  Clear to auscultation without rales, wheezing or rhonchi  ABDOMEN: Soft, non-tender, non-distended EXTREMITIES:  No edema; No deformity    ASSESSMENT AND PLAN: .     Assessment and Plan Assessment & Plan Atrioventricular block with bradycardia and conduction abnormalities EKG shows sinus rhythm with first-degree AV block, PR interval of 294 ms, second-degree heart  block type 1 (Wenckebach phenomenon), and 2:1 AV block. Average heart rate is 52 bpm with right bundle branch block and left axis deviation. Symptoms include exertional dyspnea and presyncope. No AV nodal blocking agents are being used. Concerns that even with pacemaker, symptoms may not resolve completely. - Refer to electrophysiology for pacemaker evaluation - Perform treadmill test to assess heart rate response to exercise - Await results of Zio    Exertional dyspnea and presyncope Symptoms of shortness of breath and presyncope, especially after exertion.  Recent cardiac workup for the symptoms was overall unrevealing.  Mild to moderate coronary plaque noted on prior CT. - Perform treadmill test to assess heart rate response to exercise   Coronary atherosclerosis with moderate stenosis Coronary CT scan showed moderate coronary stenosis without severe obstruction. Previous coronary calcium  score was 96.  We could perform coronary angiography if absolutely necessary. - Perform treadmill test to assess heart rate response to exercise   History of pulmonary embolism and deep vein thrombosis DVT and small left-sided subsegmental PE. Currently on prophylactic dose of Rivaroxaban  (Xarelto ) 10 mg daily. Recent CT scan showed no evidence of new pulmonary embolism. Workup by primary care physician were unremarkable.   Possible dysautonomia (POTS syndrome) Symptoms include presyncope, lightheadedness, and feeling faint, especially when bending down and standing up. Previous workup by primary care physician included orthostatic blood pressures, which were unremarkable. Dysautonomia, including POTS, considered as a differential diagnosis, but symptoms do not fully align. Liberalization of salt and fluids has been attempted without significant improvement.            Dispo: Follow up with testing.    Signed, Oneil Parchment, MD       EP Attending  Patient seen and examined. I agree with the findings  as noted above. The patient has a h/o heart block and wore a cardiac zio monitor which demonstrates CHB and 2:1 AV block. She is on no AV nodal blocking drugs and has baseline RBBB and on echo preserved LV function. I have discussed the indications/risks/benefits/goals/expectations of PPM insertion and she wishes to proceed.  Leah Nalu Troublefield,MD

## 2023-11-19 ENCOUNTER — Telehealth: Payer: Self-pay | Admitting: Cardiothoracic Surgery

## 2023-11-19 NOTE — Telephone Encounter (Signed)
 Received call from husband (derm MD) that patient experiencing a hematoma at pacemaker incision site.  No systemic symptoms.  Not currently on an anticoagulant.  Steristrips still intact and dry, just swollen underneath.  No fever.  I told him that if she were to come to the ED, we would put some gauze on her incision and then a pressure dressing to help w/ the hematoma for at least 24 hours.  He is able to put a pressure dressing himself on her, and will do so for the next 24h.  If she were to have fever, or the incision is still swollen, he will bring her to the ED or call the EP office for further evaluation.  Recommended continuing to hold her xarelto  (prior DVT/PE, but no PE on recent Chest CT).  Andee Flatten, MD Cards on call.

## 2023-11-20 ENCOUNTER — Encounter (HOSPITAL_COMMUNITY): Payer: Self-pay | Admitting: Internal Medicine

## 2023-11-21 ENCOUNTER — Ambulatory Visit: Attending: Cardiology

## 2023-11-21 ENCOUNTER — Telehealth: Payer: Self-pay

## 2023-11-21 DIAGNOSIS — R001 Bradycardia, unspecified: Secondary | ICD-10-CM

## 2023-11-21 NOTE — Telephone Encounter (Signed)
 Pt called in stating that her wound site is swollen and she is concerned. Pt is going to send in a picture to email

## 2023-11-21 NOTE — Progress Notes (Signed)
 In office wound check for follow up of hematoma post PPM implant from 11/18/23.  Patient reports she did have swelling s/p implant and applied a pressure dressing over the weekend.  She removed this prior to her visit today.   Her PPM implant site is clean/ dry/ and intact.  No signs/ symptoms of infection noted.  She does have some mild swelling to the area, which the patient has been advised is a normal amount of swelling post implant. The patient also complains of pain to the lateral side of the device/ under the left arm pit.   The patient has been advised that what she is experiencing is typical post procedure swelling and discomfort.  She has been advised to continue to monitor her site for any changes.  She is also aware that she may continue to use tylenol  as needed as well as ice packs to help minimize inflammation to the implant site.   Leah Bird is aware of her wound check appointment that is scheduled for Thursday 12/08/23 at 11:20 am with the Device Clinic and that her steri strips will be removed at that time.   The patient voices understanding and is agreeable.

## 2023-11-21 NOTE — Telephone Encounter (Signed)
 Patient states that the swelling under her steri-strips is getting bigger since her surgery on 8/15.  Patient is taking Xarelto , appears called on call on 8/16 and he had her continue to hold her Xarelto .  Patient to come in to clinic today to evaluate site.  Appt at 11am on 11/21/23.  Pic sent this am doesn't appear impressive but hard to tell sometimes in pictures, will bring in to be ensure proper healing.

## 2023-11-21 NOTE — Telephone Encounter (Signed)
 Patient contacted this morning and we had her come in at 11am to evaluate.   She is healing appropriately, no concerns for swelling today - see encounter note for details.  Thanks.    We will see her at her 10-14 day post op appt on 9//4/25.

## 2023-11-21 NOTE — Patient Instructions (Signed)
 Follow up as scheduled.

## 2023-11-22 ENCOUNTER — Ambulatory Visit: Admitting: Cardiology

## 2023-11-24 MED FILL — Midazolam HCl Inj 2 MG/2ML (Base Equivalent): INTRAMUSCULAR | Qty: 3 | Status: AC

## 2023-11-30 ENCOUNTER — Institutional Professional Consult (permissible substitution): Admitting: Cardiovascular Disease

## 2023-12-01 ENCOUNTER — Ambulatory Visit: Attending: Internal Medicine | Admitting: *Deleted

## 2023-12-01 DIAGNOSIS — R001 Bradycardia, unspecified: Secondary | ICD-10-CM

## 2023-12-01 NOTE — Patient Instructions (Signed)

## 2023-12-01 NOTE — Progress Notes (Signed)
 Normal dual chamber pacemaker wound check. Presenting rhythm: AS/VP. Wound well healed. Routine testing performed. Thresholds, sensing, and impedance consistent with implant measurements and at 3.5V safety margin/auto capture until 3 month visit. No episodes. Reviewed arm restrictions to continue for 6 weeks total post op.  Pt enrolled in remote follow-up.

## 2023-12-06 ENCOUNTER — Ambulatory Visit

## 2023-12-08 ENCOUNTER — Ambulatory Visit

## 2024-01-02 ENCOUNTER — Ambulatory Visit (INDEPENDENT_AMBULATORY_CARE_PROVIDER_SITE_OTHER)

## 2024-01-02 DIAGNOSIS — R001 Bradycardia, unspecified: Secondary | ICD-10-CM | POA: Diagnosis not present

## 2024-01-02 LAB — CUP PACEART REMOTE DEVICE CHECK
Battery Remaining Longevity: 151 mo
Battery Voltage: 3.21 V
Brady Statistic AP VP Percent: 17.39 %
Brady Statistic AP VS Percent: 0 %
Brady Statistic AS VP Percent: 82.39 %
Brady Statistic AS VS Percent: 0.22 %
Brady Statistic RA Percent Paced: 17.36 %
Brady Statistic RV Percent Paced: 99.78 %
Date Time Interrogation Session: 20250928215527
Implantable Lead Connection Status: 753985
Implantable Lead Connection Status: 753985
Implantable Lead Implant Date: 20250815
Implantable Lead Implant Date: 20250815
Implantable Lead Location: 753859
Implantable Lead Location: 753860
Implantable Lead Model: 3830
Implantable Lead Model: 5076
Implantable Pulse Generator Implant Date: 20250815
Lead Channel Impedance Value: 380 Ohm
Lead Channel Impedance Value: 399 Ohm
Lead Channel Impedance Value: 437 Ohm
Lead Channel Impedance Value: 513 Ohm
Lead Channel Pacing Threshold Amplitude: 0.375 V
Lead Channel Pacing Threshold Amplitude: 1 V
Lead Channel Pacing Threshold Pulse Width: 0.4 ms
Lead Channel Pacing Threshold Pulse Width: 0.4 ms
Lead Channel Sensing Intrinsic Amplitude: 1.75 mV
Lead Channel Sensing Intrinsic Amplitude: 1.75 mV
Lead Channel Sensing Intrinsic Amplitude: 21.25 mV
Lead Channel Sensing Intrinsic Amplitude: 26.25 mV
Lead Channel Setting Pacing Amplitude: 1.75 V
Lead Channel Setting Pacing Amplitude: 2 V
Lead Channel Setting Pacing Pulse Width: 0.4 ms
Lead Channel Setting Sensing Sensitivity: 1.2 mV
Zone Setting Status: 755011

## 2024-01-03 ENCOUNTER — Ambulatory Visit: Payer: Self-pay | Admitting: Cardiology

## 2024-01-04 NOTE — Progress Notes (Signed)
 Remote PPM Transmission

## 2024-01-09 DIAGNOSIS — M1711 Unilateral primary osteoarthritis, right knee: Secondary | ICD-10-CM | POA: Diagnosis not present

## 2024-01-09 DIAGNOSIS — M7051 Other bursitis of knee, right knee: Secondary | ICD-10-CM | POA: Diagnosis not present

## 2024-01-09 DIAGNOSIS — M25561 Pain in right knee: Secondary | ICD-10-CM | POA: Diagnosis not present

## 2024-01-09 DIAGNOSIS — M1712 Unilateral primary osteoarthritis, left knee: Secondary | ICD-10-CM | POA: Diagnosis not present

## 2024-01-09 DIAGNOSIS — M170 Bilateral primary osteoarthritis of knee: Secondary | ICD-10-CM | POA: Diagnosis not present

## 2024-01-10 ENCOUNTER — Ambulatory Visit: Attending: Cardiovascular Disease

## 2024-01-10 DIAGNOSIS — R001 Bradycardia, unspecified: Secondary | ICD-10-CM

## 2024-01-10 NOTE — Progress Notes (Signed)
 Pt seen in device clinic d/t concerns of feeling lumps at pacemaker site.  Site evaluated with Daphne Barrack, NP.  Reassured Pt that what she is feeling is the wires from her pacemaker.    Pt also continues to have some soreness.  Reassured Pt that this is normal at this point in the healing process.  Site is well healed.  No s/s of infection.  Pt will continue to monitor and call if any further concerns.

## 2024-01-10 NOTE — Patient Instructions (Signed)
 Follow up as scheduled.

## 2024-02-16 ENCOUNTER — Ambulatory Visit (INDEPENDENT_AMBULATORY_CARE_PROVIDER_SITE_OTHER): Admitting: Internal Medicine

## 2024-02-16 ENCOUNTER — Encounter: Payer: Self-pay | Admitting: Internal Medicine

## 2024-02-16 VITALS — BP 106/78 | HR 68 | Temp 98.0°F | Ht 63.0 in | Wt 129.0 lb

## 2024-02-16 DIAGNOSIS — R55 Syncope and collapse: Secondary | ICD-10-CM

## 2024-02-16 DIAGNOSIS — Z7901 Long term (current) use of anticoagulants: Secondary | ICD-10-CM

## 2024-02-16 DIAGNOSIS — M5481 Occipital neuralgia: Secondary | ICD-10-CM

## 2024-02-16 MED ORDER — HYDROCODONE-ACETAMINOPHEN 5-325 MG PO TABS: 1.0000 | ORAL_TABLET | Freq: Four times a day (QID) | ORAL | 0 refills | Status: AC | PRN

## 2024-02-16 MED ORDER — METHYLPREDNISOLONE 4 MG PO TBPK: ORAL_TABLET | ORAL | 1 refills | Status: DC

## 2024-02-16 NOTE — Patient Instructions (Addendum)
 USEFUL THINGS FOR ARTHRITIS and musculoskeletal pains:    A rice sock heating pad refers to a homemade heating pad created by filling a sock with uncooked rice, which can be heated in a microwave to provide a warm compress for sore muscles, pain relief, or other applications; essentially, it's a simple way to generate heat using readily available materials.  Key points about rice sock heat: How to make it: Fill a clean sock (preferably a tube sock) about 2/3 full with uncooked rice, tie a knot at the top to secure the rice inside.  Heating it up: Place the rice sock in the microwave and heat in short intervals (usually around 30 seconds at a time) until it reaches the desired warmth.  Important considerations: Check temperature before applying: Always test the temperature of the rice sock before applying it to your skin to avoid burns.  Use a towel to protect skin: Wrap the rice sock in a thin towel to distribute the heat evenly and protect your skin.  Uses: Muscle aches and pains  Menstrual cramps  Neck pain  Arthritis discomfort   Voltaren gel Heat

## 2024-02-16 NOTE — Progress Notes (Signed)
 Subjective:  Patient ID: Leah Bird, female    DOB: 04-28-1950  Age: 73 y.o. MRN: [id number]  CC: Medical Management of Chronic Issues (3 Month follow up)   HPI Leah Bird presents for neck pain and L occipital pain x 3 weeks, severe at times.  No changes in vision.  No jaw claudication.  No nausea vomiting.  No rash. H/o occipital neuralgia    Outpatient Medications Prior to Visit  Medication Sig Dispense Refill   b complex vitamins tablet Take 1 tablet by mouth daily. 100 tablet 3   Calcium  Citrate-Vitamin D  (CALCIUM  CITRATE +D PO) Take 1 tablet by mouth daily. 600 mg of calcium  citrate and 400 mg of D3     Cholecalciferol (VITAMIN D3) 50 MCG (2000 UT) capsule Take 2,000 Units by mouth daily.     folic acid  (FOLVITE ) 800 MCG tablet Take 400 mcg by mouth daily.     rosuvastatin  (CRESTOR ) 10 MG tablet TAKE 1 TABLET BY MOUTH DAILY 90 tablet 3   XARELTO  10 MG TABS tablet TAKE 1 TABLET BY MOUTH DAILY 90 tablet 3   HYDROcodone -acetaminophen  (NORCO/VICODIN) 5-325 MG tablet Take 1 tablet by mouth every 6 (six) hours as needed for moderate pain (pain score 4-6). 20 tablet 0   ondansetron  (ZOFRAN -ODT) 4 MG disintegrating tablet Take 1 tablet (4 mg total) by mouth every 8 (eight) hours as needed for nausea or vomiting. 20 tablet 1   No facility-administered medications prior to visit.    ROS: Review of Systems  Constitutional:  Negative for activity change, appetite change, chills, fatigue and unexpected weight change.  HENT:  Negative for congestion, mouth sores and sinus pressure.   Eyes:  Negative for pain, redness and visual disturbance.  Respiratory:  Negative for cough and chest tightness.   Gastrointestinal:  Negative for abdominal pain and nausea.  Genitourinary:  Negative for difficulty urinating, frequency and vaginal pain.  Musculoskeletal:  Negative for back pain and gait problem.  Skin:  Negative for pallor and rash.  Neurological:  Positive for headaches. Negative  for dizziness, tremors, syncope, weakness and numbness.  Psychiatric/Behavioral:  Negative for confusion and sleep disturbance.     Objective:  BP 106/78   Pulse 68   Temp 98 F (36.7 C)   Ht 5' 3 (1.6 m)   Wt 129 lb (58.5 kg)   SpO2 95%   BMI 22.85 kg/m   BP Readings from Last 3 Encounters:  02/16/24 106/78  11/18/23 110/65  11/15/23 135/85    Wt Readings from Last 3 Encounters:  02/16/24 129 lb (58.5 kg)  11/18/23 129 lb (58.5 kg)  11/15/23 129 lb (58.5 kg)    Physical Exam Constitutional:      General: She is not in acute distress.    Appearance: She is well-developed.  HENT:     Head: Normocephalic.     Right Ear: External ear normal.     Left Ear: External ear normal.     Nose: Nose normal.  Eyes:     General:        Right eye: No discharge.        Left eye: No discharge.     Conjunctiva/sclera: Conjunctivae normal.     Pupils: Pupils are equal, round, and reactive to light.  Neck:     Thyroid : No thyromegaly.     Vascular: No JVD.     Trachea: No tracheal deviation.  Cardiovascular:     Rate and Rhythm: Normal rate and  regular rhythm.     Heart sounds: Normal heart sounds.  Pulmonary:     Effort: No respiratory distress.     Breath sounds: No stridor. No wheezing.  Abdominal:     General: Bowel sounds are normal. There is no distension.     Palpations: Abdomen is soft. There is no mass.     Tenderness: There is no abdominal tenderness. There is no guarding or rebound.  Musculoskeletal:        General: Tenderness present.     Cervical back: Normal range of motion and neck supple. No rigidity.  Lymphadenopathy:     Cervical: No cervical adenopathy.  Skin:    Findings: No erythema or rash.  Neurological:     Cranial Nerves: No cranial nerve deficit.     Motor: No abnormal muscle tone.     Coordination: Coordination normal.     Deep Tendon Reflexes: Reflexes normal.  Psychiatric:        Behavior: Behavior normal.        Thought Content: Thought  content normal.        Judgment: Judgment normal.   Tender to palpation at the left skull base posteriorly and over the right posterior side of the skull No rash No pulsating blood vessels No mouth lesions  Lab Results  Component Value Date   WBC 9.1 11/09/2023   HGB 14.3 11/09/2023   HCT 44.2 11/09/2023   PLT 333 11/09/2023   GLUCOSE 101 (H) 11/09/2023   CHOL 150 11/04/2022   TRIG 63 11/04/2022   HDL 69 11/04/2022   LDLCALC 68 11/04/2022   ALT 19 10/18/2023   AST 20 10/18/2023   NA 139 11/09/2023   K 4.1 11/09/2023   CL 103 11/09/2023   CREATININE 0.75 11/09/2023   BUN 15 11/09/2023   CO2 27 11/09/2023   TSH 3.27 10/18/2023   INR 0.91 04/23/2011   HGBA1C 5.8 10/29/2019    DG Chest 2 View Result Date: 11/18/2023 CLINICAL DATA:  Pacemaker implant EXAM: CHEST - 2 VIEW COMPARISON:  Chest radiograph November 09, 2023. FINDINGS: The heart size and mediastinal contours are within normal limits. Interval placement of left chest wall pacer with the leads overlying the right atrium and ventricle and the generator pack overlying the left chest wall. Patchy hazy opacities throughout both lungs. Blunting of left costophrenic angle query trace effusion. The visualized skeletal structures are unremarkable. IMPRESSION: Patchy hazy opacities throughout both lungs. Blunting of left costophrenic angle. Left chest wall pacer in place. Electronically Signed   By: Megan  Zare M.D.   On: 11/18/2023 18:42   EP PPM/ICD IMPLANT Result Date: 11/18/2023 CONCLUSIONS:  1. Successful implantation of a Medtronic dual-chamber pacemaker for symptomatic bradycardia due to 2:1 AV block  2. No early apparent complications.       Danelle Birmingham, MD 11/18/2023 1:06 PM    Assessment & Plan:   Problem List Items Addressed This Visit     Chronic anticoagulation   Acknowledged. H/o PE      Near syncope - Primary   No relapse.  Status post pacemaker placement for 2nd degree AV block, symptomatic bradycardia Doing  well      Occipital neuralgia of left side   Recurrent.  Management discussed.  Prescribed Medrol  Dosepak.  Norco as needed  Potential benefits of a short term opioids use as well as potential risks (i.e. addiction risk, apnea etc) and complications (i.e. Somnolence, constipation and others) were explained to the patient and were aknowledged.  Nerve block option was discussed.       Relevant Medications   HYDROcodone -acetaminophen  (NORCO/VICODIN) 5-325 MG tablet      Meds ordered this encounter  Medications   HYDROcodone -acetaminophen  (NORCO/VICODIN) 5-325 MG tablet    Sig: Take 1 tablet by mouth every 6 (six) hours as needed for moderate pain (pain score 4-6).    Dispense:  20 tablet    Refill:  0   methylPREDNISolone  (MEDROL  DOSEPAK) 4 MG TBPK tablet    Sig: As directed    Dispense:  21 tablet    Refill:  1      Follow-up: Return for a follow-up visit.  Marolyn Noel, MD

## 2024-02-19 ENCOUNTER — Encounter: Payer: Self-pay | Admitting: Internal Medicine

## 2024-02-19 NOTE — Assessment & Plan Note (Signed)
 Acknowledged. H/o PE

## 2024-02-19 NOTE — Assessment & Plan Note (Signed)
 Recurrent.  Management discussed.  Prescribed Medrol  Dosepak.  Norco as needed  Potential benefits of a short term opioids use as well as potential risks (i.e. addiction risk, apnea etc) and complications (i.e. Somnolence, constipation and others) were explained to the patient and were aknowledged. Nerve block option was discussed.

## 2024-02-19 NOTE — Assessment & Plan Note (Signed)
 No relapse.  Status post pacemaker placement for 2nd degree AV block, symptomatic bradycardia Doing well

## 2024-02-23 NOTE — Progress Notes (Signed)
  Electrophysiology Office Note:   Date:  02/23/2024  ID:  Leah Bird, DOB July 16, 1950, MRN [id number]  Primary Cardiologist: Oneil Parchment, MD Primary Heart Failure: None Electrophysiologist: None      History of Present Illness:   Leah Bird is a 73 y.o. female with h/o nonobstructive coronary artery disease, DVT/PE, AV block seen today for routine electrophysiology follow-up s/p Pacemaker implant.  Discussed the use of AI scribe software for clinical note transcription with the patient, who gave verbal consent to proceed.  History of Present Illness Leah Bird is a 73 year old female with a pacemaker who presents for a follow-up on her device function and symptoms.  She feels 'unbelievable' and like herself again, except when going up hills, which causes some difficulty. Her heart rate is being monitored by her pacemaker, which is set to prevent her heart rate from dropping below 60 bpm. Her heart rate typically stays in the 60s at rest and can rise to 140 bpm with exertion.  She inquires about discomfort in her clavicular area, which she describes as painful. She is concerned about the possibility of needing further intervention.  She has a history of complete heart block and has a pacemaker.   she denies chest pain, palpitations, dyspnea, PND, orthopnea, nausea, vomiting, dizziness, syncope, edema, weight gain, or early satiety.    Review of systems complete and found to be negative unless listed in HPI.      EP Information / Studies Reviewed:    EKG is ordered today. Personal review as below.      PPM Interrogation-  reviewed in detail today,  See PACEART report.  Device History: Medtronic Dual Chamber PPM implanted 11/18/2023 for Second Degree AV block  Risk Assessment/Calculations:            Physical Exam:   VS:  There were no vitals taken for this visit.   Wt Readings from Last 3 Encounters:  02/16/24 129 lb (58.5 kg)  11/18/23 129 lb (58.5 kg)   11/15/23 129 lb (58.5 kg)     GEN: Well nourished, well developed in no acute distress NECK: No JVD; No carotid bruits CARDIAC: Regular rate and rhythm, no murmurs, rubs, gallops RESPIRATORY:  Clear to auscultation without rales, wheezing or rhonchi  ABDOMEN: Soft, non-tender, non-distended EXTREMITIES:  No edema; No deformity   ASSESSMENT AND PLAN:    Second Degree AV block s/p Medtronic PPM  Normal PPM function See Pace Art report No changes today  2.  DVT/PE: On Xarelto .  Managed by primary care.  3.  Nonobstructive coronary artery disease: No current chest pain.  Plan per primary cardiology  Disposition:   Follow up with EP Team in 12 months  Signed, Dois Juarbe Gladis Norton, MD

## 2024-02-24 ENCOUNTER — Ambulatory Visit: Attending: Cardiology | Admitting: Cardiology

## 2024-02-24 ENCOUNTER — Encounter: Admitting: Cardiology

## 2024-02-24 ENCOUNTER — Encounter: Payer: Self-pay | Admitting: Cardiology

## 2024-02-24 VITALS — BP 120/76 | HR 64 | Ht 63.0 in | Wt 129.0 lb

## 2024-02-24 DIAGNOSIS — I442 Atrioventricular block, complete: Secondary | ICD-10-CM | POA: Diagnosis not present

## 2024-02-24 DIAGNOSIS — R001 Bradycardia, unspecified: Secondary | ICD-10-CM

## 2024-02-24 LAB — CUP PACEART INCLINIC DEVICE CHECK
Date Time Interrogation Session: 20251121105048
Implantable Lead Connection Status: 753985
Implantable Lead Connection Status: 753985
Implantable Lead Implant Date: 20250815
Implantable Lead Implant Date: 20250815
Implantable Lead Location: 753859
Implantable Lead Location: 753860
Implantable Lead Model: 3830
Implantable Lead Model: 5076
Implantable Pulse Generator Implant Date: 20250815

## 2024-02-29 ENCOUNTER — Ambulatory Visit: Payer: Self-pay | Admitting: Cardiology

## 2024-03-13 ENCOUNTER — Inpatient Hospital Stay

## 2024-03-13 ENCOUNTER — Ambulatory Visit: Admitting: Hematology & Oncology

## 2024-03-14 ENCOUNTER — Encounter: Payer: Self-pay | Admitting: Hematology & Oncology

## 2024-03-14 ENCOUNTER — Inpatient Hospital Stay: Admitting: Hematology & Oncology

## 2024-03-14 ENCOUNTER — Other Ambulatory Visit: Payer: Self-pay

## 2024-03-14 ENCOUNTER — Inpatient Hospital Stay: Attending: Hematology & Oncology

## 2024-03-14 VITALS — BP 117/70 | HR 65 | Temp 98.0°F | Resp 16 | Ht 63.0 in | Wt 130.0 lb

## 2024-03-14 DIAGNOSIS — M7541 Impingement syndrome of right shoulder: Secondary | ICD-10-CM | POA: Diagnosis not present

## 2024-03-14 DIAGNOSIS — Z7901 Long term (current) use of anticoagulants: Secondary | ICD-10-CM | POA: Diagnosis not present

## 2024-03-14 DIAGNOSIS — D6862 Lupus anticoagulant syndrome: Secondary | ICD-10-CM | POA: Insufficient documentation

## 2024-03-14 DIAGNOSIS — I82401 Acute embolism and thrombosis of unspecified deep veins of right lower extremity: Secondary | ICD-10-CM

## 2024-03-14 DIAGNOSIS — Z86718 Personal history of other venous thrombosis and embolism: Secondary | ICD-10-CM | POA: Diagnosis not present

## 2024-03-14 DIAGNOSIS — I82402 Acute embolism and thrombosis of unspecified deep veins of left lower extremity: Secondary | ICD-10-CM

## 2024-03-14 DIAGNOSIS — Z86711 Personal history of pulmonary embolism: Secondary | ICD-10-CM | POA: Insufficient documentation

## 2024-03-14 LAB — CBC WITH DIFFERENTIAL (CANCER CENTER ONLY)
Abs Immature Granulocytes: 0.02 K/uL (ref 0.00–0.07)
Basophils Absolute: 0 K/uL (ref 0.0–0.1)
Basophils Relative: 0 %
Eosinophils Absolute: 0.2 K/uL (ref 0.0–0.5)
Eosinophils Relative: 3 %
HCT: 40.8 % (ref 36.0–46.0)
Hemoglobin: 13.4 g/dL (ref 12.0–15.0)
Immature Granulocytes: 0 %
Lymphocytes Relative: 25 %
Lymphs Abs: 2.1 K/uL (ref 0.7–4.0)
MCH: 31.5 pg (ref 26.0–34.0)
MCHC: 32.8 g/dL (ref 30.0–36.0)
MCV: 95.8 fL (ref 80.0–100.0)
Monocytes Absolute: 0.6 K/uL (ref 0.1–1.0)
Monocytes Relative: 7 %
Neutro Abs: 5.3 K/uL (ref 1.7–7.7)
Neutrophils Relative %: 65 %
Platelet Count: 328 K/uL (ref 150–400)
RBC: 4.26 MIL/uL (ref 3.87–5.11)
RDW: 13.4 % (ref 11.5–15.5)
WBC Count: 8.2 K/uL (ref 4.0–10.5)
nRBC: 0 % (ref 0.0–0.2)

## 2024-03-14 LAB — CMP (CANCER CENTER ONLY)
ALT: 23 U/L (ref 0–44)
AST: 23 U/L (ref 15–41)
Albumin: 4.4 g/dL (ref 3.5–5.0)
Alkaline Phosphatase: 72 U/L (ref 38–126)
Anion gap: 9 (ref 5–15)
BUN: 11 mg/dL (ref 8–23)
CO2: 30 mmol/L (ref 22–32)
Calcium: 9.3 mg/dL (ref 8.9–10.3)
Chloride: 104 mmol/L (ref 98–111)
Creatinine: 0.72 mg/dL (ref 0.44–1.00)
GFR, Estimated: >60 mL/min (ref >60)
Glucose, Bld: 102 mg/dL — ABNORMAL HIGH (ref 70–99)
Potassium: 4.4 mmol/L (ref 3.5–5.1)
Sodium: 142 mmol/L (ref 135–145)
Total Bilirubin: 0.3 mg/dL (ref 0.0–1.2)
Total Protein: 7.3 g/dL (ref 6.5–8.1)

## 2024-03-14 NOTE — Progress Notes (Addendum)
 Hematology and Oncology Follow Up Visit  Leah Bird [id number] 04/27/1950 73 y.o. 03/14/2024   Principle Diagnosis:  Left pulmonary embolism Left leg thromboembolic disease femoral vein down to peroneal vein Lupus anticoagulant positive - ?  Transient MTHFR -heterozygous   Current Therapy:        Xarelto  20 mg p.o. daily -complete 1 year in April 2022 Xarelto  10 mg po q day -- maintenance -  Start on 07/2020 Folic acid  2 mg p.o. daily   Interim History:  Leah Bird is here today for follow-up.  Since we last saw her, she really has had an interesting time.  She has a pacemaker in now.  She I think had bradycardia.  She just was not feeling all that well.  The pacemaker was put in back in August.  She we will be having her 74th birthday in March.  In April, she and her husband are going to [place].  She and her husband just got back from the [facility].  That is where her grandchildren are.  They voice have a good time.  They are out there for 2 weeks.  Otherwise, she is feeling okay.  She did have a Doppler of the left leg that was done back in June.  This showed a chronic nonocclusive thrombus..  She continues on Xarelto .  She is doing well on the Xarelto .  She has had no fever.  She has had no chest pain.  She has had no cough.  Overall, I would say that her performance status is probably ECOG 1.    Medications:  Allergies as of 03/14/2024       Reactions   Other Shortness Of Breath   Pseudocholinesterace- prolonged anesthesia. 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 If you have any questions, ask your nurse or doctor.          STOP taking these medications    methylPREDNISolone  4 MG Tbpk tablet Commonly known as: MEDROL  DOSEPAK Stopped by: Maude JONELLE Crease       TAKE  these medications    b complex vitamins tablet Take 1 tablet by mouth daily.   CALCIUM  CITRATE +D PO Take 1 tablet by mouth daily. 600 mg of calcium  citrate and 400 mg of D3   folic acid  800 MCG tablet Commonly known as: FOLVITE  Take 400 mcg by mouth daily.   HYDROcodone -acetaminophen  5-325 MG tablet Commonly known as: NORCO/VICODIN Take 1 tablet by mouth every 6 (six) hours as needed for moderate pain (pain score 4-6).   rosuvastatin  10 MG tablet Commonly known as: CRESTOR  TAKE 1 TABLET BY MOUTH DAILY   Vitamin D3 50 MCG (2000 UT) capsule Take 2,000 Units by mouth daily.   Xarelto  10 MG Tabs tablet Generic drug: rivaroxaban  TAKE 1 TABLET BY MOUTH DAILY        Allergies:  Allergies  Allergen Reactions   Other Shortness Of Breath    Pseudocholinesterace- prolonged anesthesia. 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  Review of Systems: All other 10 point review of systems is negative.  Physical Exam:  height is 5' 3 (1.6 m) and weight is 130 lb (59 kg). Her oral temperature is 98 F (36.7 C). Her blood pressure is 117/70 and her pulse is 65. Her respiration is 16 and oxygen saturation is 98%.   Wt Readings from Last 3 Encounters:  03/14/24 130 lb (59 kg)  02/24/24 129 lb (58.5 kg)  02/16/24 129 lb (58.5 kg)    Ocular: Sclerae unicteric, pupils equal, round and reactive to light Ear-nose-throat: Oropharynx clear, dentition fair Lymphatic: No cervical or supraclavicular adenopathy Lungs no rales or rhonchi, good excursion bilaterally Heart regular rate and rhythm, no murmur appreciated Abd soft, nontender, positive bowel sounds MSK no focal spinal tenderness, no joint edema Neuro: non-focal, well-oriented, appropriate affect Breasts: Deferred    Lab Results  Component Value Date   WBC 8.2 03/14/2024   HGB 13.4 03/14/2024   HCT 40.8 03/14/2024   MCV 95.8 03/14/2024   PLT 328 03/14/2024   No results found for: FERRITIN, IRON, TIBC, UIBC, IRONPCTSAT Lab Results  Component Value Date   RBC 4.26 03/14/2024   No results found for: KPAFRELGTCHN, LAMBDASER, KAPLAMBRATIO No results found for: IGGSERUM, IGA, IGMSERUM No results found for: STEPHANY CARLOTA BENSON MARKEL EARLA JOANNIE DOC VICK, SPEI   Chemistry      Component Value Date/Time   NA 139 11/09/2023 1106   K 4.1 11/09/2023 1106   CL 103 11/09/2023 1106   CO2 27 11/09/2023 1106   BUN 15 11/09/2023 1106   CREATININE 0.75 11/09/2023 1106   CREATININE 0.86 09/12/2023 0940      Component Value Date/Time   CALCIUM  9.5 11/09/2023 1106   ALKPHOS 82 10/18/2023 1502   AST 20 10/18/2023 1502   AST 16 09/12/2023 0940   ALT 19 10/18/2023 1502   ALT 14 09/12/2023 0940   BILITOT 0.4 10/18/2023 1502   BILITOT 0.5 09/12/2023 0940       Impression and Plan: Leah Bird is a very pleasant 73 yo female with history of thromboembolic disease.   We will keep her on the Xarelto .  She has had no problems with the Xarelto .  With all of her traveling, Xarelto  certainly comes in handy.  When she does travel across the country or, in her case, across the [facility], she will also take aspirin with the Xarelto .  Will still get her back in 6 months.  I look forward to hearing about her trip over to [place].   Maude JONELLE Crease, MD 12/10/20259:28 AM

## 2024-03-21 DIAGNOSIS — Z1151 Encounter for screening for human papillomavirus (HPV): Secondary | ICD-10-CM | POA: Diagnosis not present

## 2024-03-21 DIAGNOSIS — Z124 Encounter for screening for malignant neoplasm of cervix: Secondary | ICD-10-CM | POA: Diagnosis not present

## 2024-04-02 ENCOUNTER — Ambulatory Visit (INDEPENDENT_AMBULATORY_CARE_PROVIDER_SITE_OTHER)

## 2024-04-02 DIAGNOSIS — R001 Bradycardia, unspecified: Secondary | ICD-10-CM

## 2024-04-03 LAB — CUP PACEART REMOTE DEVICE CHECK
Battery Remaining Longevity: 148 mo
Battery Voltage: 3.18 V
Brady Statistic AP VP Percent: 18.76 %
Brady Statistic AP VS Percent: 0 %
Brady Statistic AS VP Percent: 80.98 %
Brady Statistic AS VS Percent: 0.26 %
Brady Statistic RA Percent Paced: 18.86 %
Brady Statistic RV Percent Paced: 99.74 %
Date Time Interrogation Session: 20251228192939
Implantable Lead Connection Status: 753985
Implantable Lead Connection Status: 753985
Implantable Lead Implant Date: 20250815
Implantable Lead Implant Date: 20250815
Implantable Lead Location: 753859
Implantable Lead Location: 753860
Implantable Lead Model: 3830
Implantable Lead Model: 5076
Implantable Pulse Generator Implant Date: 20250815
Lead Channel Impedance Value: 380 Ohm
Lead Channel Impedance Value: 399 Ohm
Lead Channel Impedance Value: 494 Ohm
Lead Channel Impedance Value: 513 Ohm
Lead Channel Pacing Threshold Amplitude: 0.375 V
Lead Channel Pacing Threshold Amplitude: 0.75 V
Lead Channel Pacing Threshold Pulse Width: 0.4 ms
Lead Channel Pacing Threshold Pulse Width: 0.4 ms
Lead Channel Sensing Intrinsic Amplitude: 1.75 mV
Lead Channel Sensing Intrinsic Amplitude: 1.75 mV
Lead Channel Sensing Intrinsic Amplitude: 21.25 mV
Lead Channel Sensing Intrinsic Amplitude: 26.25 mV
Lead Channel Setting Pacing Amplitude: 1.5 V
Lead Channel Setting Pacing Amplitude: 2 V
Lead Channel Setting Pacing Pulse Width: 0.4 ms
Lead Channel Setting Sensing Sensitivity: 1.2 mV
Zone Setting Status: 755011

## 2024-04-04 NOTE — Progress Notes (Signed)
 Remote PPM Transmission

## 2024-04-07 ENCOUNTER — Ambulatory Visit: Payer: Self-pay | Admitting: Cardiology

## 2024-04-11 ENCOUNTER — Other Ambulatory Visit (HOSPITAL_COMMUNITY): Payer: Self-pay | Admitting: Orthopedic Surgery

## 2024-04-11 DIAGNOSIS — M25511 Pain in right shoulder: Secondary | ICD-10-CM

## 2024-05-30 ENCOUNTER — Other Ambulatory Visit (HOSPITAL_COMMUNITY)

## 2024-07-02 ENCOUNTER — Encounter

## 2024-09-12 ENCOUNTER — Inpatient Hospital Stay

## 2024-09-12 ENCOUNTER — Inpatient Hospital Stay: Admitting: Hematology & Oncology

## 2024-10-01 ENCOUNTER — Encounter

## 2024-12-31 ENCOUNTER — Encounter
